# Patient Record
Sex: Female | Born: 1955 | Race: White | Hispanic: No | Marital: Married | Smoking: Former smoker
Health system: Southern US, Community
[De-identification: ages and names within clinical notes are randomized; demographics above are authoritative.]

## PROBLEM LIST (undated history)

## (undated) DIAGNOSIS — K589 Irritable bowel syndrome without diarrhea: Secondary | ICD-10-CM

## (undated) DIAGNOSIS — J3489 Other specified disorders of nose and nasal sinuses: Secondary | ICD-10-CM

## (undated) DIAGNOSIS — K219 Gastro-esophageal reflux disease without esophagitis: Secondary | ICD-10-CM

## (undated) DIAGNOSIS — I10 Essential (primary) hypertension: Secondary | ICD-10-CM

## (undated) DIAGNOSIS — Z8669 Personal history of other diseases of the nervous system and sense organs: Secondary | ICD-10-CM

## (undated) DIAGNOSIS — R0683 Snoring: Secondary | ICD-10-CM

## (undated) DIAGNOSIS — R112 Nausea with vomiting, unspecified: Secondary | ICD-10-CM

## (undated) DIAGNOSIS — J302 Other seasonal allergic rhinitis: Secondary | ICD-10-CM

## (undated) DIAGNOSIS — Z8719 Personal history of other diseases of the digestive system: Secondary | ICD-10-CM

## (undated) DIAGNOSIS — N393 Stress incontinence (female) (male): Secondary | ICD-10-CM

## (undated) DIAGNOSIS — M1990 Unspecified osteoarthritis, unspecified site: Secondary | ICD-10-CM

## (undated) DIAGNOSIS — R200 Anesthesia of skin: Secondary | ICD-10-CM

## (undated) DIAGNOSIS — Z9889 Other specified postprocedural states: Secondary | ICD-10-CM

## (undated) HISTORY — DX: Irritable bowel syndrome, unspecified: K58.9

## (undated) HISTORY — PX: LAPAROSCOPIC CHOLECYSTECTOMY: SUR755

## (undated) HISTORY — DX: Unspecified osteoarthritis, unspecified site: M19.90

## (undated) HISTORY — DX: Gastro-esophageal reflux disease without esophagitis: K21.9

## (undated) HISTORY — PX: TUBAL LIGATION: SHX77

## (undated) HISTORY — PX: ESOPHAGOGASTRODUODENOSCOPY: SHX1529

## (undated) HISTORY — PX: COLONOSCOPY: SHX174

## (undated) HISTORY — PX: DILATION AND CURETTAGE OF UTERUS: SHX78

## (undated) HISTORY — PX: LAPAROSCOPIC ASSISTED VAGINAL HYSTERECTOMY: SHX5398

## (undated) HISTORY — DX: Essential (primary) hypertension: I10

## (undated) HISTORY — PX: LAPAROSCOPIC GASTRIC BANDING: SHX1100

## (undated) HISTORY — PX: ORIF TOE FRACTURE: SUR965

---
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
## 1998-12-30 ENCOUNTER — Other Ambulatory Visit: Admission: RE | Admit: 1998-12-30 | Discharge: 1998-12-30 | Payer: Self-pay | Admitting: Obstetrics and Gynecology

## 2000-03-02 ENCOUNTER — Other Ambulatory Visit: Admission: RE | Admit: 2000-03-02 | Discharge: 2000-03-02 | Payer: Self-pay | Admitting: Obstetrics & Gynecology

## 2001-03-08 ENCOUNTER — Other Ambulatory Visit: Admission: RE | Admit: 2001-03-08 | Discharge: 2001-03-08 | Payer: Self-pay | Admitting: Obstetrics & Gynecology

## 2001-11-08 ENCOUNTER — Ambulatory Visit (HOSPITAL_COMMUNITY): Admission: AD | Admit: 2001-11-08 | Discharge: 2001-11-09 | Payer: Self-pay | Admitting: *Deleted

## 2001-11-09 ENCOUNTER — Encounter: Payer: Self-pay | Admitting: *Deleted

## 2001-12-01 ENCOUNTER — Ambulatory Visit (HOSPITAL_COMMUNITY): Admission: RE | Admit: 2001-12-01 | Discharge: 2001-12-01 | Payer: Self-pay | Admitting: Gastroenterology

## 2001-12-01 ENCOUNTER — Encounter: Payer: Self-pay | Admitting: Gastroenterology

## 2001-12-29 ENCOUNTER — Encounter (HOSPITAL_BASED_OUTPATIENT_CLINIC_OR_DEPARTMENT_OTHER): Payer: Self-pay | Admitting: General Surgery

## 2002-01-03 ENCOUNTER — Encounter (HOSPITAL_BASED_OUTPATIENT_CLINIC_OR_DEPARTMENT_OTHER): Payer: Self-pay | Admitting: General Surgery

## 2002-01-03 ENCOUNTER — Ambulatory Visit (HOSPITAL_COMMUNITY): Admission: RE | Admit: 2002-01-03 | Discharge: 2002-01-04 | Payer: Self-pay | Admitting: General Surgery

## 2002-01-03 ENCOUNTER — Encounter (INDEPENDENT_AMBULATORY_CARE_PROVIDER_SITE_OTHER): Payer: Self-pay | Admitting: *Deleted

## 2002-04-13 ENCOUNTER — Other Ambulatory Visit: Admission: RE | Admit: 2002-04-13 | Discharge: 2002-04-13 | Payer: Self-pay | Admitting: Internal Medicine

## 2002-05-17 ENCOUNTER — Ambulatory Visit (HOSPITAL_COMMUNITY): Admission: RE | Admit: 2002-05-17 | Discharge: 2002-05-17 | Payer: Self-pay | Admitting: Gastroenterology

## 2002-05-17 ENCOUNTER — Encounter (INDEPENDENT_AMBULATORY_CARE_PROVIDER_SITE_OTHER): Payer: Self-pay | Admitting: *Deleted

## 2003-01-26 ENCOUNTER — Ambulatory Visit (HOSPITAL_COMMUNITY): Admission: RE | Admit: 2003-01-26 | Discharge: 2003-01-26 | Payer: Self-pay | Admitting: Obstetrics & Gynecology

## 2003-01-26 ENCOUNTER — Encounter (INDEPENDENT_AMBULATORY_CARE_PROVIDER_SITE_OTHER): Payer: Self-pay

## 2004-02-06 ENCOUNTER — Observation Stay (HOSPITAL_COMMUNITY): Admission: RE | Admit: 2004-02-06 | Discharge: 2004-02-07 | Payer: Self-pay | Admitting: Obstetrics & Gynecology

## 2004-02-06 ENCOUNTER — Encounter (INDEPENDENT_AMBULATORY_CARE_PROVIDER_SITE_OTHER): Payer: Self-pay | Admitting: Specialist

## 2005-04-27 ENCOUNTER — Ambulatory Visit (HOSPITAL_COMMUNITY): Admission: RE | Admit: 2005-04-27 | Discharge: 2005-04-27 | Payer: Self-pay | Admitting: Internal Medicine

## 2008-01-09 ENCOUNTER — Ambulatory Visit (HOSPITAL_COMMUNITY): Admission: RE | Admit: 2008-01-09 | Discharge: 2008-01-09 | Payer: Self-pay | Admitting: Internal Medicine

## 2009-06-27 ENCOUNTER — Ambulatory Visit (HOSPITAL_COMMUNITY): Admission: RE | Admit: 2009-06-27 | Discharge: 2009-06-27 | Payer: Self-pay | Admitting: Surgery

## 2009-07-09 ENCOUNTER — Ambulatory Visit (HOSPITAL_COMMUNITY): Admission: RE | Admit: 2009-07-09 | Discharge: 2009-07-09 | Payer: Self-pay | Admitting: Surgery

## 2009-07-09 ENCOUNTER — Encounter: Admission: RE | Admit: 2009-07-09 | Discharge: 2009-08-28 | Payer: Self-pay | Admitting: Surgery

## 2009-10-16 ENCOUNTER — Encounter: Admission: RE | Admit: 2009-10-16 | Discharge: 2010-01-14 | Payer: Self-pay | Admitting: Surgery

## 2009-11-05 ENCOUNTER — Ambulatory Visit (HOSPITAL_COMMUNITY): Admission: RE | Admit: 2009-11-05 | Discharge: 2009-11-06 | Payer: Self-pay | Admitting: Surgery

## 2009-12-13 ENCOUNTER — Emergency Department (HOSPITAL_COMMUNITY): Admission: EM | Admit: 2009-12-13 | Discharge: 2009-12-14 | Payer: Self-pay | Admitting: Emergency Medicine

## 2010-02-21 ENCOUNTER — Encounter: Admission: RE | Admit: 2010-02-21 | Discharge: 2010-02-21 | Payer: Self-pay | Admitting: Surgery

## 2010-05-26 ENCOUNTER — Encounter: Admission: RE | Admit: 2010-05-26 | Discharge: 2010-05-29 | Payer: Self-pay | Admitting: Surgery

## 2010-08-20 ENCOUNTER — Encounter
Admission: RE | Admit: 2010-08-20 | Discharge: 2010-09-30 | Payer: Self-pay | Source: Home / Self Care | Attending: Surgery | Admitting: Surgery

## 2010-11-19 ENCOUNTER — Encounter: Payer: 59 | Attending: Surgery | Admitting: *Deleted

## 2010-11-19 ENCOUNTER — Encounter: Admit: 2010-11-19 | Payer: Self-pay | Admitting: Surgery

## 2010-11-19 DIAGNOSIS — Z09 Encounter for follow-up examination after completed treatment for conditions other than malignant neoplasm: Secondary | ICD-10-CM | POA: Insufficient documentation

## 2010-11-19 DIAGNOSIS — Z713 Dietary counseling and surveillance: Secondary | ICD-10-CM | POA: Insufficient documentation

## 2010-11-19 DIAGNOSIS — Z9884 Bariatric surgery status: Secondary | ICD-10-CM | POA: Insufficient documentation

## 2010-11-24 LAB — COMPREHENSIVE METABOLIC PANEL
ALT: 33 U/L (ref 0–35)
Alkaline Phosphatase: 75 U/L (ref 39–117)
CO2: 28 mEq/L (ref 19–32)
Chloride: 97 mEq/L (ref 96–112)
Glucose, Bld: 109 mg/dL — ABNORMAL HIGH (ref 70–99)
Sodium: 136 mEq/L (ref 135–145)
Total Bilirubin: 0.8 mg/dL (ref 0.3–1.2)
Total Protein: 7.2 g/dL (ref 6.0–8.3)

## 2010-11-24 LAB — DIFFERENTIAL
Basophils Absolute: 0 10*3/uL (ref 0.0–0.1)
Eosinophils Absolute: 0 10*3/uL (ref 0.0–0.7)
Eosinophils Relative: 0 % (ref 0–5)
Eosinophils Relative: 1 % (ref 0–5)
Lymphocytes Relative: 28 % (ref 12–46)
Lymphs Abs: 1.7 10*3/uL (ref 0.7–4.0)
Monocytes Absolute: 0.4 10*3/uL (ref 0.1–1.0)
Monocytes Absolute: 0.4 10*3/uL (ref 0.1–1.0)
Monocytes Relative: 6 % (ref 3–12)
Monocytes Relative: 7 % (ref 3–12)
Neutro Abs: 3.8 10*3/uL (ref 1.7–7.7)
Neutrophils Relative %: 79 % — ABNORMAL HIGH (ref 43–77)

## 2010-11-24 LAB — CBC
HCT: 42.5 % (ref 36.0–46.0)
MCHC: 33.6 g/dL (ref 30.0–36.0)
MCV: 100.4 fL — ABNORMAL HIGH (ref 78.0–100.0)
Platelets: 203 10*3/uL (ref 150–400)
RBC: 3.81 MIL/uL — ABNORMAL LOW (ref 3.87–5.11)
RDW: 13.1 % (ref 11.5–15.5)
WBC: 7.8 10*3/uL (ref 4.0–10.5)

## 2011-01-12 ENCOUNTER — Ambulatory Visit (HOSPITAL_COMMUNITY)
Admission: RE | Admit: 2011-01-12 | Discharge: 2011-01-12 | Disposition: A | Discharge status: Home / Self Care | Payer: 59 | Source: Ambulatory Visit | Attending: Internal Medicine | Admitting: Internal Medicine

## 2011-01-12 ENCOUNTER — Other Ambulatory Visit (HOSPITAL_COMMUNITY): Payer: Self-pay | Admitting: Internal Medicine

## 2011-01-12 DIAGNOSIS — M545 Low back pain, unspecified: Secondary | ICD-10-CM | POA: Insufficient documentation

## 2011-01-12 DIAGNOSIS — M5380 Other specified dorsopathies, site unspecified: Secondary | ICD-10-CM | POA: Insufficient documentation

## 2011-01-12 DIAGNOSIS — M5137 Other intervertebral disc degeneration, lumbosacral region: Secondary | ICD-10-CM | POA: Insufficient documentation

## 2011-01-12 DIAGNOSIS — M79609 Pain in unspecified limb: Secondary | ICD-10-CM | POA: Insufficient documentation

## 2011-01-12 DIAGNOSIS — M51379 Other intervertebral disc degeneration, lumbosacral region without mention of lumbar back pain or lower extremity pain: Secondary | ICD-10-CM | POA: Insufficient documentation

## 2011-01-16 NOTE — Op Note (Signed)
   Maureen [name], Maureen [name]                          ACCOUNT NO.:  [id number]   MEDICAL RECORD NO.:  [id number]                   PATIENT TYPE:  AMB   LOCATION:  SDC                                  FACILITY:  [facility]   PHYSICIAN:  Genia Del, M.D.             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   PROCEDURES:  1. Hysteroscopy.  2. Resection of polyp.  3. Dilatation and curettage.   SURGEON:  Genia Del, M.D.   ANESTHESIA:  Dorinda Hill T. Pamalee Leyden, M.D.   DESCRIPTION OF PROCEDURE:  Under general anesthesia with laryngeal mask, the  patient is in lithotomy position.  She is prepped with Betadine on the  suprapubic, vulvar, and vaginal areas.  The bladder is catheterized, and the  patient is draped as usual.  The vaginal exam reveals a retroverted uterus,  slightly irregular in shape, 9 cm, mobile, no adnexal mass.  The speculum is  inserted.  The anterior lip of the cervix is grasped with a tenaculum.  Dilatation of the cervix is done with Hegar dilators up to #31 without  difficulty.  We then insert the surgical hysteroscope in the intrauterine  cavity.  Visualization of the intrauterine cavity is achieved easily.  The  ostia are visualized and pictures are taken.  On the posterior wall a lesion  corresponding to a probable polyp is seen.  This is resected with the loop.  We then resect a smaller probable polyp and all specimens are sent to  pathology.  We then proceed with a systematic curettage of the intrauterine  cavity with a sharp curette, and that is sent separately.  We complete with  visualization again of the intrauterine cavity.  Hemostasis is adequate.  No  lesion is  seen.  We remove the instruments, and the estimated blood loss was  50 mL.  The fluid deficit was 140 mL.  No complication occurred, and the  patient was brought to the recovery room in good status.                                                 Genia Del, M.D.    ML/MEDQ  D:  01/26/2003  T:  01/26/2003  Job:  [id number]

## 2011-01-16 NOTE — H&P (Signed)
NAME:  Maureen [name], Maureen [name]                          ACCOUNT NO.:  [id number]   MEDICAL RECORD NO.:  [id number]                   PATIENT TYPE:  OBV   LOCATION:  NA                                   FACILITY:  [facility]   PHYSICIAN:  Genia Del, M.D.             DATE OF BIRTH:  04-02-1956   DATE OF ADMISSION:  02/06/2004  DATE OF DISCHARGE:                                HISTORY & PHYSICAL   HISTORY AND PHYSICAL:  Mrs. Brooker is a 55 year old G3, P1, A2.   REASON FOR ADMISSION:  Refractory menometrorrhagia and abdominal pelvic pain  associated with uterine myoma.   HISTORY OF PRESENT ILLNESS:  The patient had a hysteroscopy with resection  of a submucous myoma and D&C Jan 26, 2003.  She then was treated with  Micronor which helped at first but now menometrorrhagia recurring with  increased lower abdominal and lower back pain.   PAST MEDICAL HISTORY:  1. Cystitis.  2. Borderline chronic hypertension, no current treatment.   PAST SURGICAL HISTORY:  1. Excisional biopsy, left breast in 1971.  2. Diagnostic laparoscopy, conservative treatment of endometriosis in mid     1990s.  3. Bilateral tubal sterilization.  4. One spontaneous vaginal delivery.  5. Two spontaneous abortions.  6. Hysteroscopy with resection of endometrial polyps Jan 26, 2003.   ALLERGIES:  No known drug allergies.   CURRENT MEDICATIONS:  Micronor.   SOCIAL HISTORY:  Married, nonsmoker.   FAMILY HISTORY:  Cardiovascular disease, diabetes mellitus, and chronic  hypertension.   REVIEW OF SYSTEMS:  HEENT:  Negative.  CARDIOVASCULAR AND RESPIRATORY:  Negative.  GI AND GU: Negative.  DERMATOLOGIC, ENDOCRINE, NEUROLOGIC:  Negative.   PHYSICAL EXAMINATION:  GENERAL:  No apparent distress.  VITAL SIGNS:  Weight 217 pounds.  Blood pressure 130/88, pulse 80, afebrile.  Regular respiratory rate.  LUNGS:  Clear bilaterally.  HEART:  Regular cardiac rhythm.  ABDOMEN:  Soft, nontender.  No mass felt.  No  hepatosplenomegaly.  VAGINAL EXAM:  Uterus is retroverted about 10 cm, nontender, mobile, no  adnexal mass.  EXTREMITIES:  Lower limbs normal, good pulses bilaterally, no edema.   LABORATORY AND X-RAY DATA:  Ultrasound April 2004:  Uterus was 10.6 cm  maximum diameter with three fibroids.  Maximum size was 3.9 cm posteriorly.   IMPRESSION:  Refractory menometrorrhagia associated with uterine myomas post  conservative surgery and medical treatment.   PLAN:  Laparoscopic-assisted vaginal hysterectomy with preservation of  ovaries after thorough discussion of benefits of risks of preserving the  ovaries.  Surgery and risks reviewed including trauma to the bowel, blood  vessels, bladder, and ureters; risks of hemorrhage and blood transfusion;  risks of DVT up to pulmonary embolism; risks of infection; risks of  anesthesia complication.  The patient voiced understanding and agreement  with plan.  Genia Del, M.D.    ML/MEDQ  D:  02/05/2004  T:  02/05/2004  Job:  [id number]

## 2011-01-16 NOTE — Op Note (Signed)
NAMEAMANDA, Maureen [name]                          ACCOUNT NO.:  [id number]   MEDICAL RECORD NO.:  [id number]                   PATIENT TYPE:  AMB   LOCATION:  ENDO                                 FACILITY:  [facility]   PHYSICIAN:  Charna Elizabeth, M.D.                   DATE OF BIRTH:  03/28/1956   DATE OF PROCEDURE:  05/17/2002  DATE OF DISCHARGE:  05/17/2002                                 OPERATIVE REPORT   PROCEDURE:  Screening colonoscopy.   ENDOSCOPIST:  Charna Elizabeth, M.D.   INSTRUMENT USED:  Olympus video colonoscope.   INDICATION FOR PROCEDURE:  A 55 year old white female with a family history  of colon cancer and blood in stool with chronic diarrhea.  Rule out colonic  polyps, masses, hemorrhoids, etc.   PREPROCEDURE PREPARATION:  Informed consent was procured from the patient.  The patient was fasted for eight hours prior to the procedure and prepped  with a bottle of magnesium citrate and a gallon of NuLytely the night prior  to the procedure.   PREPROCEDURE PHYSICAL:  VITAL SIGNS:  The patient had stable vital signs.  NECK:  Supple.  CHEST:  Clear to auscultation.  S1, S2 regular.  ABDOMEN:  Soft with normal bowel sounds.   DESCRIPTION OF PROCEDURE:  The patient was placed in the left lateral  decubitus position and sedated with 20 mg of Demerol and an additional 2.5  mg of Versed intravenously for the colonoscopy in addition to the sedation  she received for the EGD.  Once the patient was adequately sedate and  maintained on low-flow oxygen and continuous cardiac monitoring, the Olympus  video colonoscope was advanced from the rectum to the cecum and terminal  ileum without difficulty.  No masses, polyps, erosions, ulcerations, or  diverticula were seen.  There was no evidence of hemorrhoids.  The procedure  was complete up to the cecum and terminal ileum.  The appendiceal orifice  and the ileocecal valve were clearly visualized and photographed.  The  terminal ileum  appeared normal.   IMPRESSION:  Normal colonoscopy up to the terminal ileum.  No masses,  polyps, erosions, ulcerations, diverticula, or hemorrhoids seen.   RECOMMENDATIONS:  1. Cholestyramine 9 g pack, half b.i.d. has been prescribed for the     patient's diarrhea, which is probably secondary to her cholecystectomy.  2. A high-fiber diet has been discussed with her in great detail, and     brochures have been given to her for her education.  3.     Repeat colorectal cancer screening is recommended in the next five years     unless the patient develops any abnormal symptoms in the interim.  4. Outpatient follow-up in the next two weeks or earlier if need be.  Charna [place], M.D.    JM/MEDQ  D:  05/17/2002  T:  05/19/2002  Job:  [id number]   cc:   Lucky Cowboy, MD  [address]  [place], [place] [address]  Fax: [phone]

## 2011-01-16 NOTE — Discharge Summary (Signed)
[place]. [facility]  Patient:    Maureen [name], Maureen [name] Visit Number: [id number] MRN: [id number]          Service Type: MED Location: [phone] Attending Physician:  Glennon [place] Dictated by:   Lavella Hammock, P.A. Admit Date:  11/08/2001 Discharge Date: 11/09/2001   CC:         Anselmo Rod, M.D.  Lindaann Pascal, P.A.C., for Dr. Lucky Cowboy   Referring Physician Discharge [place]  DATE OF BIRTH:  03-Sep-1955  PROCEDURES:  Stress Cardiolite.  HOSPITAL COURSE:  [name] is a 55 year old female with no known history of coronary artery disease who was evaluated in the office on November 08, 2001 for chest tightness.  She had been having episodic chest tightness for a period of time and was being evaluated by her family physician for this.  There was concern because of a family history of premature coronary artery disease and because of that, because of recurrent episodes of chest pain she was repeated to the hospital to rule out MI and for further evaluation.  Her enzymes were negative for MI and she was scheduled for a stress Cardiolite which was performed on November 09, 2001.  The patient exercised and then had Cardiolite images done.  The rest and stress images showed no ischemia, no scar, and an EF of 73%.  The patient had no chest pain and no EKG changes during the stress period.  There was some elevation in her blood sugars with a fasting blood sugar of 155 but hemoglobin A1c was done and was within normal limits at 5.1%. Additionally, she had a lipid profile checked and that was also within normal limits.  With no further episodes of chest pain with a negative Cardiolite she was considered stable for discharge on November 09, 2001.  LABORATORY DATA:  Hemoglobin A1c 5.1.  Total cholesterol 150, triglycerides 127, HDL 47, LDL 78.  TSH within normal limits at 2.038.  Sodium 137, potassium 3.8, chloride 102, CO2 26, BUN 8, creatinine 0.7, and glucose  155. Hemoglobin 13.2, hematocrit 38, wbcs 6.1, platelets 243.  DISCHARGE CONDITION:  Stable.  DISCHARGE DIAGNOSES: 1. Chest pain, possibly secondary to musculoskeletal disease or reflux    symptoms.  Patient is to follow up as scheduled with Dr. Elsie Amis of GI    and with her primary care physician. 2. Hypertension. 3. History of bronchitis, possible asthma. 4. Irritable bowel syndrome. 5. Remote history of mild tobacco use. 6. Family history of premature coronary artery disease with a father who had    his first myocardial infarction in his 23s.  DISCHARGE INSTRUCTIONS:  ACTIVITY:  Her activity level is to be as tolerated.  DIET:  She is to stick to a low fat, no sugar added diet until she gets further evaluated by Dr. Oneta Rack.  FOLLOW-UP:  She is to follow up with Dr. Elsie Amis as scheduled.  She is to follow up with Lindaann Pascal, P.A.C. for Dr. Oneta Rack and call for an appointment. She is to follow up with Dr. Corinda Gubler as needed.  DISCHARGE MEDICATIONS: 1. Singular q.d. 2. Tiazac 180 mg q.d. 3. Albuterol MDI p.r.n. 4. Multivitamins, vitamin E, vitamins C as taken prior to admission. 5. Nexium 40 mg q.d. Dictated by:   Lavella Hammock, P.A. Attending Physician:  Glennon [place] DD:  11/09/01 TD:  11/09/01 Job: [id number]

## 2011-01-16 NOTE — Op Note (Signed)
[place]. [facility]  Patient:    Maureen [name], Maureen [name] Visit Number: [id number] MRN: [id number]          Service Type: DSU Location: [phone] Attending Physician:  Sonda Primes Dictated by:   Mardene Celeste. Lurene Shadow, M.D. Proc. Date: 01/03/02 Admit Date:  01/03/2002 Discharge Date: 01/04/2002                             Operative Report  PREOPERATIVE DIAGNOSIS:  Chronic calculus cholecystitis.  POSTOPERATIVE DIAGNOSIS:  Chronic calculus cholecystitis.  OPERATION PERFORMED:  Laparoscopic cholecystectomy with intraoperative cholangiogram.  SURGEON:  Luisa Hart L. Lurene Shadow, M.D.  ASSISTANT:  Marnee Spring. Wiliam Ke, M.D.  ANESTHESIA:  General.  INDICATIONS FOR PROCEDURE:  This patient is a 55 year old woman who presents with upper abdominal symptoms and a gallbladder ultrasound which demonstrates cholelithiasis.  The risks and potential benefits of laparoscopic cholecystectomy have been discussed in detail with the patient.  She understands and accepts these risks and gives consent for surgery.  DESCRIPTION OF PROCEDURE:  Following the induction of satisfactory general anesthesia with the patient positioned supine, the abdomen was routinely prepped and draped to be included in a sterile operative field.  Open laparoscopy was created at the umbilicus with insertion of a Hasson cannula and insufflation of the peritoneal cavity to 14 mHg pressure using carbon dioxide.  A camera was inserted and visual exploration of the abdomen carried out.  Liver edges were sharp.  Liver surface was smooth.  Anterior gastric wall, duodenal sweep appeared to be normal.  None of the small or large intestine viewed appeared to be abnormal.  With the exception of the bladder, no pelvic organs were visualized.  Under direct vision, epigastric and lateral ports were placed.  The gallbladder was grasped and retracted cephalad and dissection carried down near the ampulla with  isolation of the cystic artery and the cystic duct.  The cystic artery was somewhat adhered to the cystic duct and was dissected away from it.  The cystic artery was doubly clipped and then transected.  The cystic duct was clipped proximally and opened.  Cystic duct cholangiogram was carried out by inserting a Reddick catheter into the abdomen through a 14 gauge angiocath and injecting one half strength Hypaque into the biliary system.  The resulting cholangiogram showed contrast flowing freely into the duodenum and distally into the hepatic radicals.  There were no filling defects noted and the ductal system was within normal caliber.  The cystic duct catheter was removed and the cystic duct then doubly clipped and transected.  The gallbladder dissected free from the liver edge maintaining hemostasis throughout the course of dissection with electrocautery.  At the end of dissection, the liver bed was again checked for hemostasis and noted to be dry.  The right upper quadrant was then thoroughly irrigated with normal saline.  The camera moved to the epigastric port and the gallbladder retrieved through the umbilical port using an Endocatch.  The right upper quadrant was then thoroughly aspirated.  Sponge, instrument and sharp counts were verified. Pneumoperitoneum allowed to deflate and the wounds closed in layers as follows.  Umbilical wound in two layers with 0 Dexon and 4-0 Dexon. Epigastric and lateral port wounds were closed with 4-0 Dexon sutures.  All wounds were reinforced with Steri-Strips.  Sterile dressings applied. Anesthetic reversed.  Patient removed from the operating room to the recovery room in stable condition having tolerated the procedure  well. Dictated by:   Mardene Celeste. Lurene Shadow, M.D. Attending Physician:  Sonda Primes DD:  01/03/02 TD:  01/04/02 Job: [id number]

## 2011-01-16 NOTE — Op Note (Signed)
Maureen [name], Maureen [name]                          ACCOUNT NO.:  [id number]   MEDICAL RECORD NO.:  [id number]                   PATIENT TYPE:  OBV   LOCATION:  9399                                 FACILITY:  [facility]   PHYSICIAN:  Genia Del, M.D.             DATE OF BIRTH:  Nov 15, 1955   DATE OF PROCEDURE:  02/06/2004  DATE OF DISCHARGE:                                 OPERATIVE REPORT   PREOPERATIVE DIAGNOSIS:  Refractory menometrorrhagia with abdominal pelvic  pains associated with uterine myomas.   POSTOPERATIVE DIAGNOSIS:  Refractory menometrorrhagia with abdominal pelvic  pains associated with uterine myomas.   INTERVENTION:  Laparoscopically assisted vaginal hysterectomy.   SURGEON:  Genia Del, M.D.   ASSISTANT:  Richardean Sale, M.D.   ANESTHESIOLOGIST:  Raul Del, M.D.   PROCEDURE:  Under general anesthesia with endotracheal intubation, the  patient was in the lithotomy position for operative laparoscopy, she is  prepped with Hibiclens on the abdomen, suprapubic, vulvar and vaginal areas.  The bladder catheter was inserted.  The patient is draped as usual.  A  speculum is introduced in the vagina.  The uterus is cannulated and the  speculum is removed.  Abdominally, the infraumbilical area is infiltrated at  the subcutaneous tissue with Marcaine 0.25% plain, 7 mL.  We made a 2 cm  infraumbilical incision with the scalpel.  We then grasped the aponeurosis  with Allis' and opened the aponeurosis under direct vision with the Mayo  scissors.  The parietal peritoneum is opened, also, with Mayo scissors under  direct vision.  We then made a purse-string suture of 0 Vicryl around the  aponeurosis.  We introduced the Hasson trocar and the laparoscope at that  level.  Inspection of the pelvic cavity reveals a retroverted uterus of  increased volume about 10 cm with uterine myomas.  Both ovaries are normal  in size and appearance.  Both tubes show previous tubal  sterilization.  The  last ovary had two small cysts distally.  The pelvis inspection is,  otherwise, normal.  The abdominal inspection is normal.  We made a lateral  incision at the left iliac area.  The subcutaneous tissues were 0.25%  Marcaine 3 mL.  We then made a 5 cm incision with the scalpel and introduced  the 5 mm trocar under direct vision.  We proceed the same way on the right  side.  Instruments were then placed, the atraumatic clamp and the tripolar.  We cauterized and sectioned extensively the left round ligament.  Then, we  removed the distal parts of the left tube with the small cyst.  We  cauterized and sectioned the left utero-ovarian ligament and moved down  following the uterus and stopped just before the left uterine artery.  The  visceroperitoneum anteriorly is opened with the scissors and the bladder is  reclined downward.  We proceeded the same way on the right side except  that  on that side, the distal end of the tube appears normal and is left in  place.  Hemostasis is adequate.  We removed the distal left tube through the  infraumbilical trocar using a 5 mm camera on the right iliac trocar.  The  specimen is sent to pathology with the uterus at the end of the  intervention.  We removed all instruments.  We draped the abdomen.  We  repositioned the patient's legs upwards and proceeded with vaginal  procedure.   We introduced a short, weighted speculum in the vagina.  The intrauterine  cannula is removed and tenaculums are put on the cervix.  Infiltration of  the cervix at the junction of the vaginal mucosa is done circumferentially  with Xylocaine 1% with 1:200,000 epinephrine, 10 mL.  We then used the  scalpel to make an incision circumferentially at that level.  We then used a  sponge on a finger to push up the vaginal mucosa on the cervix.  The  peritoneum is opened anteriorly and posteriorly.  The long weighted speculum  is put in place posteriorly and a  retractor is used anteriorly to push the  bladder away from the surgical field.  We clamped the cardinal ligaments and  the uterosacral ligaments on the left side with a curved Heaney.  We  sectioned with Mayo scissors and sutured with a Heaney stitch of 0 Vicryl.  We kept it on a hemostat for later suspension.  We proceed the same way on  the right side.  We then used LigaSure to cauterize and then the Metzenbaum  scissors to section the uterine arteries on each side following the uterus  closely reaching the area where the surgery was stopped laparoscopically.  The uterus is removed in one piece including the cervix and sent to  pathology with the distal left tube.  We then made a running suture of 0  Vicryl in the posterior vaginal vault.  We then made a suspensory suture on  the left side including the anterior vaginal mucosa, the anterior  peritoneum, the left uterosacral ligament, the posterior peritoneum, and the  posterior vaginal vault, this is attached on the left side.  We proceed the  same way on the right side.  We then closed the vaginal vault with X  stitches of 0 Vicryl anteriorly to posteriorly.  In the middle, the  uterosacral ligaments are attached together.  Hemostasis was adequate at all  pedicles.  Hemostasis is adequate after closing the vagina, as well.  We  removed vaginal instruments.   We go back to the abdomen.  The pneumoperitoneum is recreated.  We inspect  the abdominal pelvic cavity, irrigation and suction is done, very small  points of bleeding are seen at the vaginal vault.  Those are completed with  the tripolar very superficially.  Hemostasis is adequate at all pedicles.  We evacuate the CO2, remove the instruments and trocars under direct vision.  We attach the suture on the aponeurosis at the infraumbilical incision.  The  sponge and instrument counts were complete.  We close the infraumbilical incision at the skin with subcuticular 4-0 Monocryl.   Hemostasis is  adequate.  We closed the 5 mm incision with Dermabond at the skin.  The  estimated blood loss was 1 mL.  No complications occurred.  The patient was  transferred stable to the recovery room.  Note that she received Ancef 1  gram IV at induction.  Genia Del, M.D.    ML/MEDQ  D:  02/06/2004  T:  02/06/2004  Job:  [id number]

## 2011-01-16 NOTE — Op Note (Signed)
NAMECARYLE, Maureen [name]                          ACCOUNT NO.:  [id number]   MEDICAL RECORD NO.:  [id number]                   PATIENT TYPE:  AMB   LOCATION:  ENDO                                 FACILITY:  [facility]   PHYSICIAN:  Charna Elizabeth, M.D.                   DATE OF BIRTH:  08/19/1956   DATE OF PROCEDURE:  05/17/2002  DATE OF DISCHARGE:  05/17/2002                                 OPERATIVE REPORT   PROCEDURE:  Esophagogastroduodenoscopy with biopsies.   ENDOSCOPIST:  Charna Elizabeth, M.D.   INSTRUMENT USED:  Olympus video panendoscope.   INDICATION FOR PROCEDURE:  History of epigastric pain, diarrhea, and blood  in stool in a 55 year old white female.  Rule out peptic ulcer disease,  esophagitis, gastritis, etc.  The patient is status post cholecystectomy.   PREPROCEDURE PREPARATION:  Informed consent was procured from the patient.  The patient was fasted for eight hours prior to the procedure.   PREPROCEDURE PHYSICAL:  VITAL SIGNS:  The patient had stable vital signs.  NECK:  Supple.  CHEST:  Clear to auscultation.  S1, S2 regular.  ABDOMEN:  Soft with normal bowel sounds.  Well-healed scars from previous  cholecystectomy.   DESCRIPTION OF PROCEDURE:  The patient was placed in the left lateral  decubitus position and sedated with 80 mg of Demerol and 7.5 mg of Versed  intravenously.  Once the patient was adequately sedate and maintained on low-  flow oxygen and continuous cardiac monitoring, the Olympus video  panendoscope was advanced through the mouthpiece, over the tongue, into the  esophagus under direct vision.  The entire esophagus appeared normal with no  evidence of ring, stricture, masses, esophagitis, or Barrett's mucosa.  The  scope was then advanced into the stomach.  A 3-4 cm hiatal hernia was  appreciated on retroflexion in the high cardia.  A small polyp was biopsied  from the proximal stomach.  No ulcers, erosions, masses, or polyps were  seen.  The proximal  small bowel appeared normal as well.   IMPRESSION:  1. Normal-appearing esophagus and proximal small bowel.  2. A 3-4 cm hiatal hernia.  3. Small polyp biopsied from proximal stomach.   RECOMMENDATIONS:  1. Proceed with colonoscopy at this time.  2.     Await pathology results.  3. Follow antireflux measures and avoid all nonsteroidals.  4. Further recommendations will be made on an outpatient basis.                                               Charna Elizabeth, M.D.    JM/MEDQ  D:  05/17/2002  T:  05/19/2002  Job:  [id number]   cc:   Lucky Cowboy, MD  [address]  [place], [place] [address]  Fax: [phone]

## 2011-02-26 ENCOUNTER — Encounter (INDEPENDENT_AMBULATORY_CARE_PROVIDER_SITE_OTHER): Payer: Self-pay | Admitting: Surgery

## 2011-05-08 ENCOUNTER — Encounter (INDEPENDENT_AMBULATORY_CARE_PROVIDER_SITE_OTHER): Payer: Self-pay | Admitting: Surgery

## 2011-05-20 ENCOUNTER — Ambulatory Visit: Payer: 59 | Admitting: *Deleted

## 2011-07-31 ENCOUNTER — Encounter (INDEPENDENT_AMBULATORY_CARE_PROVIDER_SITE_OTHER): Payer: Self-pay

## 2011-07-31 ENCOUNTER — Ambulatory Visit (INDEPENDENT_AMBULATORY_CARE_PROVIDER_SITE_OTHER): Payer: 59 | Admitting: Physician Assistant

## 2011-07-31 VITALS — BP 136/88 | Ht 61.0 in | Wt 188.8 lb

## 2011-07-31 DIAGNOSIS — Z4651 Encounter for fitting and adjustment of gastric lap band: Secondary | ICD-10-CM

## 2011-07-31 NOTE — Patient Instructions (Signed)
Take clear liquids for the next 48 hours. Thin protein shakes are ok to start on Saturday evening. Call us if you have persistent vomiting or regurgitation, night cough or reflux symptoms. Return as scheduled or sooner if you notice no changes in hunger/portion sizes.   

## 2011-07-31 NOTE — Progress Notes (Signed)
  HISTORY: Maureen [name] is a 55 y.o.female who received an AP-Standard lap-band in March 2011 by Dr. Daphine Deutscher. She comes in stating her hunger and portion sizes have increased slightly and that she'd like a small adjustment. She denies persistent vomiting or reflux.  VITAL SIGNS: Filed Vitals:   07/31/11 1330  BP: 136/88    PHYSICAL EXAM: Physical exam reveals a very well-appearing 55 y.o.female in no apparent distress Neurologic: Awake, alert, oriented Psych: Bright affect, conversant Respiratory: Breathing even and unlabored. No stridor or wheezing Abdomen: Soft, nontender, nondistended to palpation. Incisions well-healed. No incisional hernias. Port easily palpated. Extremities: Atraumatic, good range of motion.  ASSESMENT: 55 y.o.  female  s/p AP-Standard lap-band.   PLAN: [name] port was accessed with a 20G Huber needle without difficulty. Clear fluid was aspirated and 0.25 mL saline was added to the port. The patient was able to swallow water without difficulty following the procedure and was instructed to take clear liquids for the next 24-48 hours and advance slowly as tolerated.

## 2011-09-25 ENCOUNTER — Encounter (INDEPENDENT_AMBULATORY_CARE_PROVIDER_SITE_OTHER): Payer: Self-pay | Admitting: Physician Assistant

## 2011-09-25 ENCOUNTER — Ambulatory Visit (INDEPENDENT_AMBULATORY_CARE_PROVIDER_SITE_OTHER): Payer: 59 | Admitting: Physician Assistant

## 2011-09-25 VITALS — BP 126/84 | Ht 61.0 in | Wt 187.2 lb

## 2011-09-25 DIAGNOSIS — K219 Gastro-esophageal reflux disease without esophagitis: Secondary | ICD-10-CM

## 2011-09-25 DIAGNOSIS — Z4651 Encounter for fitting and adjustment of gastric lap band: Secondary | ICD-10-CM

## 2011-09-25 NOTE — Progress Notes (Signed)
  HISTORY: Maureen [name] is a 56 y.o.female who received an AP-Standard lap-band in March 2011 by Dr. Daphine Deutscher. She was doing well from her last adjustment until about 2-3 weeks afterward when she developed a GI illness. After this resolved, she began having nocturnal reflux, almost nightly. She denies any solid food dysphagia. She has not taken any medication for the symptoms.  VITAL SIGNS: Filed Vitals:   09/25/11 0858  BP: 126/84    PHYSICAL EXAM: Physical exam reveals a very well-appearing 56 y.o.female in no apparent distress Neurologic: Awake, alert, oriented Psych: Bright affect, conversant Respiratory: Breathing even and unlabored. No stridor or wheezing Abdomen: Soft, nontender, nondistended to palpation. Incisions well-healed. No incisional hernias. Port easily palpated. Extremities: Atraumatic, good range of motion.  ASSESMENT: 56 y.o.  female  s/p AP-Standard lap-band.   PLAN: [name] port was accessed with a 20G Huber needle without difficulty. Clear fluid was aspirated and 0.25 mL saline was removed from the port. I recommended OTC omeprazole until our next visit in 6 weeks or so.

## 2011-09-25 NOTE — Patient Instructions (Signed)
Take omeprazole (20mg  tablets) twice a day for one week followed by once daily. Return in 4-6 weeks. Return sooner if symptoms persist despite the above measures.

## 2011-11-05 ENCOUNTER — Ambulatory Visit (INDEPENDENT_AMBULATORY_CARE_PROVIDER_SITE_OTHER): Payer: 59 | Admitting: Physician Assistant

## 2011-11-05 ENCOUNTER — Encounter (INDEPENDENT_AMBULATORY_CARE_PROVIDER_SITE_OTHER): Payer: Self-pay

## 2011-11-05 VITALS — BP 142/80 | HR 68 | Temp 97.6°F | Resp 18 | Ht 61.0 in | Wt 190.2 lb

## 2011-11-05 DIAGNOSIS — Z4651 Encounter for fitting and adjustment of gastric lap band: Secondary | ICD-10-CM

## 2011-11-05 NOTE — Patient Instructions (Signed)
Take clear liquids tonight. Thin protein shakes are ok to start tomorrow morning. Slowly advance your diet thereafter. Call us if you have persistent vomiting or regurgitation, night cough or reflux symptoms. Return as scheduled or sooner if you notice no changes in hunger/portion sizes.  

## 2011-11-05 NOTE — Progress Notes (Signed)
  HISTORY: Maureen [name] is a 56 y.o.female who received an AP-Standard lap-band in March 2011 by Dr. Daphine Deutscher. She was last seen in late January for reflux following a GI illness. That has since resolved. She's been taking protonix as well. She would like to have some more fluid placed to help her continue to lose weight.  VITAL SIGNS: Filed Vitals:   11/05/11 1108  BP: 142/80  Pulse: 68  Temp: 97.6 F (36.4 C)  Resp: 18    PHYSICAL EXAM: Physical exam reveals a very well-appearing 56 y.o.female in no apparent distress Neurologic: Awake, alert, oriented Psych: Bright affect, conversant Respiratory: Breathing even and unlabored. No stridor or wheezing Abdomen: Soft, nontender, nondistended to palpation. Incisions well-healed. No incisional hernias. Port easily palpated. Extremities: Atraumatic, good range of motion.  ASSESMENT: 56 y.o.  female  s/p AP-Standard lap-band.   PLAN: [name] port was accessed with a 20G Huber needle without difficulty. Clear fluid was aspirated and 0.2 mL saline was added to the port. The patient was able to swallow water without difficulty following the procedure and was instructed to take clear liquids for the next 24-48 hours and advance slowly as tolerated.

## 2011-12-30 ENCOUNTER — Telehealth (INDEPENDENT_AMBULATORY_CARE_PROVIDER_SITE_OTHER): Payer: Self-pay | Admitting: General Surgery

## 2011-12-30 NOTE — Telephone Encounter (Signed)
Patient came into clinic this morning (about 8:40) stating that her band feels tight. Patient stated that the last time she saw Mardelle [facility]te in [facility] (11/05/11) he gave her Protonix which she said helped her. Patient stated that the same feeling she had then is coming back and that the other day her chest felt full of air, she belched and said any food she eats (which is a small amount) will feel as if it is sitting there. Patient confirmed her contact numbers were [phone] (work) and [phone] (cell) and a message can be left if needed. I advised I or someone else in the clinic will contact her with an appointment to have fluid removed. Patient agreed.

## 2011-12-31 ENCOUNTER — Telehealth (INDEPENDENT_AMBULATORY_CARE_PROVIDER_SITE_OTHER): Payer: Self-pay | Admitting: General Surgery

## 2011-12-31 ENCOUNTER — Encounter (INDEPENDENT_AMBULATORY_CARE_PROVIDER_SITE_OTHER): Payer: Self-pay

## 2011-12-31 ENCOUNTER — Ambulatory Visit (INDEPENDENT_AMBULATORY_CARE_PROVIDER_SITE_OTHER): Payer: 59 | Admitting: Physician Assistant

## 2011-12-31 ENCOUNTER — Ambulatory Visit (HOSPITAL_COMMUNITY)
Admission: RE | Admit: 2011-12-31 | Discharge: 2011-12-31 | Disposition: A | Discharge status: Home / Self Care | Payer: 59 | Source: Ambulatory Visit | Attending: Physician Assistant | Admitting: Physician Assistant

## 2011-12-31 ENCOUNTER — Telehealth (INDEPENDENT_AMBULATORY_CARE_PROVIDER_SITE_OTHER): Payer: Self-pay | Admitting: Physician Assistant

## 2011-12-31 VITALS — BP 120/86 | HR 88 | Temp 97.0°F | Resp 14 | Ht 61.0 in | Wt 187.2 lb

## 2011-12-31 DIAGNOSIS — Z4651 Encounter for fitting and adjustment of gastric lap band: Secondary | ICD-10-CM

## 2011-12-31 DIAGNOSIS — R109 Unspecified abdominal pain: Secondary | ICD-10-CM | POA: Insufficient documentation

## 2011-12-31 DIAGNOSIS — K219 Gastro-esophageal reflux disease without esophagitis: Secondary | ICD-10-CM | POA: Insufficient documentation

## 2011-12-31 DIAGNOSIS — Z9884 Bariatric surgery status: Secondary | ICD-10-CM | POA: Insufficient documentation

## 2011-12-31 NOTE — Patient Instructions (Signed)
Take protein shakes for the next 1-2 days then advance as tolerated. Return in 2 weeks or sooner if needed.

## 2011-12-31 NOTE — Progress Notes (Signed)
  HISTORY: Maureen [name] is a 56 y.o.female who received an AP-Standard lap-band in March 2011 by Dr. Daphine Deutscher. She comes in complaining of progressive reflux over the past 1.5 weeks. She has tried omeprazole without relief. Last night she had to sleep in a recliner and was unable to tolerate dinner previously.  VITAL SIGNS: Filed Vitals:   12/31/11 1017  BP: 120/86  Pulse: 88  Temp: 97 F (36.1 C)  Resp: 14    PHYSICAL EXAM: Physical exam reveals a very well-appearing 56 y.o.female in no apparent distress Neurologic: Awake, alert, oriented Psych: Bright affect, conversant Respiratory: Breathing even and unlabored. No stridor or wheezing Abdomen: Soft, nontender, nondistended to palpation. Incisions well-healed. No incisional hernias. Port easily palpated. Extremities: Atraumatic, good range of motion.  ASSESMENT: 56 y.o.  female  s/p AP-Standard lap-band.   PLAN: [name] port was accessed with a 20G Huber needle without difficulty. Clear fluid was aspirated and 1 mL saline was removed from the port. I recommended she take liquids over the next 1-2 days to let things calm down and to obtain a KUB.

## 2011-12-31 NOTE — Telephone Encounter (Signed)
Spoke with patient to reconfirm what she has been dealing with since the last lap band visit on 11/05/11. Patient stated that she vomited last night about 15 seconds after completing her meal.   Per my discussion with Lenard Forth she needs to come in right now. Patient advised me that it will take her about 45 minutes to get here on her way.

## 2012-01-14 ENCOUNTER — Encounter (INDEPENDENT_AMBULATORY_CARE_PROVIDER_SITE_OTHER): Payer: Self-pay | Admitting: Physician Assistant

## 2012-01-14 ENCOUNTER — Ambulatory Visit (INDEPENDENT_AMBULATORY_CARE_PROVIDER_SITE_OTHER): Payer: 59 | Admitting: Physician Assistant

## 2012-01-14 VITALS — BP 154/94 | HR 76 | Temp 97.9°F | Resp 16 | Ht 61.0 in | Wt 192.4 lb

## 2012-01-14 DIAGNOSIS — Z4651 Encounter for fitting and adjustment of gastric lap band: Secondary | ICD-10-CM

## 2012-01-14 NOTE — Progress Notes (Signed)
  HISTORY: Maureen [name] is a 56 y.o.female who received an AP-Standard lap-band in March 2011 by Dr. Daphine Deutscher. She comes in after having fluid removed two weeks ago secondary to persistent regurgitation/vomiting. She now has no such complaints. She would like an adjustment to get back on track.  VITAL SIGNS: Filed Vitals:   01/14/12 0950  BP: 154/94  Pulse: 76  Temp: 97.9 F (36.6 C)  Resp: 16    PHYSICAL EXAM: Physical exam reveals a very well-appearing 56 y.o.female in no apparent distress Neurologic: Awake, alert, oriented Psych: Bright affect, conversant Respiratory: Breathing even and unlabored. No stridor or wheezing Abdomen: Soft, nontender, nondistended to palpation. Incisions well-healed. No incisional hernias. Port easily palpated. Extremities: Atraumatic, good range of motion.  ASSESMENT: 56 y.o.  female  s/p AP-Standard lap-band.   PLAN: [name] port was accessed with a 20G Huber needle without difficulty. Clear fluid was aspirated and 0.75 mL saline was added to the port. The patient was able to swallow water without difficulty following the procedure and was instructed to take clear liquids for the next 24-48 hours and advance slowly as tolerated.

## 2012-01-14 NOTE — Patient Instructions (Signed)
Take clear liquids tonight. Thin protein shakes are ok to start tomorrow morning. Slowly advance your diet thereafter. Call us if you have persistent vomiting or regurgitation, night cough or reflux symptoms. Return as scheduled or sooner if you notice no changes in hunger/portion sizes.  

## 2012-01-28 ENCOUNTER — Encounter (INDEPENDENT_AMBULATORY_CARE_PROVIDER_SITE_OTHER): Payer: 59

## 2012-10-20 ENCOUNTER — Other Ambulatory Visit: Payer: Self-pay

## 2012-12-08 ENCOUNTER — Encounter (INDEPENDENT_AMBULATORY_CARE_PROVIDER_SITE_OTHER): Payer: Self-pay

## 2012-12-08 ENCOUNTER — Ambulatory Visit (INDEPENDENT_AMBULATORY_CARE_PROVIDER_SITE_OTHER): Payer: 59 | Admitting: Physician Assistant

## 2012-12-08 VITALS — BP 132/96 | HR 83 | Resp 16 | Ht 61.0 in | Wt 214.2 lb

## 2012-12-08 DIAGNOSIS — Z4651 Encounter for fitting and adjustment of gastric lap band: Secondary | ICD-10-CM

## 2012-12-08 NOTE — Progress Notes (Signed)
  HISTORY: Maureen [name] is a 57 y.o.female who received an AP-Standard lap-band in March 2011 by Dr. Daphine Deutscher. She comes in having last been seen 11 months ago with 21 lbs weight gain, despite an upward adjustment at that last visit. She describes having gradually increasing hunger and portion sizes. She describes some of her portions as having doubled in size. She has no regurgitation or reflux symptoms whatsoever. She's getting hungry and snacking a couple of hours after an average meal. She would like an adjustment.  VITAL SIGNS: Filed Vitals:   12/08/12 1545  BP: 132/96  Pulse: 83  Resp: 16    PHYSICAL EXAM: Physical exam reveals a very well-appearing 57 y.o.female in no apparent distress Neurologic: Awake, alert, oriented Psych: Bright affect, conversant Respiratory: Breathing even and unlabored. No stridor or wheezing Abdomen: Soft, nontender, nondistended to palpation. Incisions well-healed. No incisional hernias. Port easily palpated. Extremities: Atraumatic, good range of motion.  ASSESMENT: 57 y.o.  female  s/p AP-Standard lap-band.   PLAN: [name] port was accessed with a 20G Huber needle without difficulty. Clear fluid was aspirated and 0.5 mL saline was added to the port. The patient was able to swallow water without difficulty following the procedure and was instructed to take clear liquids for the next 24-48 hours and advance slowly as tolerated.

## 2012-12-08 NOTE — Patient Instructions (Signed)
Take clear liquids tonight. Thin protein shakes are ok to start tomorrow morning. Slowly advance your diet thereafter. Call us if you have persistent vomiting or regurgitation, night cough or reflux symptoms. Return as scheduled or sooner if you notice no changes in hunger/portion sizes.  

## 2012-12-12 ENCOUNTER — Ambulatory Visit (INDEPENDENT_AMBULATORY_CARE_PROVIDER_SITE_OTHER): Payer: 59 | Admitting: General Surgery

## 2012-12-12 ENCOUNTER — Encounter (INDEPENDENT_AMBULATORY_CARE_PROVIDER_SITE_OTHER): Payer: Self-pay | Admitting: General Surgery

## 2012-12-12 VITALS — BP 134/90 | HR 89 | Temp 98.0°F | Ht 61.0 in | Wt 205.2 lb

## 2012-12-12 DIAGNOSIS — R1319 Other dysphagia: Secondary | ICD-10-CM

## 2012-12-12 NOTE — Progress Notes (Signed)
Subjective:     Patient ID: Maureen [name], female   DOB: 09/22/1955, 58 y.o.   MRN: [id number]  HPI This patientis known to our practice for prior LAP-BAND placed in 2011 by Dr. Daphine Deutscher. She had an adjustment of 0.5 mL last Thursday and she did okay on Friday but over the last 2 days she says that she has a hard time keeping solids and liquids down. She says that over the last 2 days she has been continuously choking on her own saliva N. Has been worse when lying flat. She says that last night she was coughing all night and she has lost 9 pounds since her visit 3 days ago. She says that nothing was really been staying down last 2 days and she feels "tight" and an "ache".  Review of Systems     Objective:   Physical Exam NAD, nontoxic No abdominal pain;. Her port was accessed on the first attempt with the immediate pushback of clear fluid she had about 5 cc in the port and I removed the 0.5 cc that was placed a few days ago which was essentially at neutral    Assessment:     Dysphagia status post lap band I removed 0.5 mL states that she was able to tolerate liquids and felt much better. I recommended post lap band fill diet and followup upper GI. She will followup with Dr. Daphine Deutscher after her upper GI in about 3 weeks      Plan:     F/u UGI and follow up in 3 weeks.

## 2012-12-13 ENCOUNTER — Telehealth (INDEPENDENT_AMBULATORY_CARE_PROVIDER_SITE_OTHER): Payer: Self-pay

## 2012-12-13 ENCOUNTER — Other Ambulatory Visit (INDEPENDENT_AMBULATORY_CARE_PROVIDER_SITE_OTHER): Payer: Self-pay

## 2012-12-13 DIAGNOSIS — Z9884 Bariatric surgery status: Secondary | ICD-10-CM

## 2012-12-13 NOTE — Telephone Encounter (Signed)
Called patient with appointment date & time for UGI.  Scheduled for Monday 12/19/12 @ 9:45 am, arrive @ 9:30, NPO after midnight.

## 2012-12-19 ENCOUNTER — Other Ambulatory Visit: Payer: 59

## 2012-12-19 ENCOUNTER — Ambulatory Visit
Admission: RE | Admit: 2012-12-19 | Discharge: 2012-12-19 | Disposition: A | Discharge status: Home / Self Care | Payer: 59 | Source: Ambulatory Visit | Attending: General Surgery | Admitting: General Surgery

## 2012-12-19 DIAGNOSIS — Z9884 Bariatric surgery status: Secondary | ICD-10-CM

## 2012-12-28 ENCOUNTER — Telehealth (INDEPENDENT_AMBULATORY_CARE_PROVIDER_SITE_OTHER): Payer: Self-pay | Admitting: General Surgery

## 2012-12-28 NOTE — Telephone Encounter (Signed)
Patient called back and stated she would be here at 12:30

## 2012-12-28 NOTE — Telephone Encounter (Signed)
Called to ask patient to come in sooner for 5/1 appt...she stated that she could be here at 9

## 2012-12-29 ENCOUNTER — Encounter (INDEPENDENT_AMBULATORY_CARE_PROVIDER_SITE_OTHER): Payer: Self-pay

## 2012-12-29 ENCOUNTER — Ambulatory Visit (INDEPENDENT_AMBULATORY_CARE_PROVIDER_SITE_OTHER): Payer: 59 | Admitting: Physician Assistant

## 2012-12-29 VITALS — BP 136/90 | HR 78 | Resp 18 | Ht 61.0 in | Wt 209.0 lb

## 2012-12-29 DIAGNOSIS — Z4651 Encounter for fitting and adjustment of gastric lap band: Secondary | ICD-10-CM

## 2012-12-29 NOTE — Progress Notes (Signed)
  HISTORY: ELFRIEDA ESPINO is a 57 y.o.female who received an AP-Standard lap-band in March 2011 by Dr. Daphine Deutscher. She comes in with persistent hunger since Dr. Biagio Quint removed fluid on 4/14. She had solid food dysphagia at that time, which has resolved. In addition, she had an upper GI done the day after her fluid removal which revealed good band position and passage of contrast through the lumen. She did have some mild reflux and sluggish motility. She would like a fill today to get her weight gain under control as well as her hunger.  VITAL SIGNS: Filed Vitals:   12/29/12 1231  BP: 136/90  Pulse: 78  Resp: 18    PHYSICAL EXAM: Physical exam reveals a very well-appearing 57 y.o.female in no apparent distress Neurologic: Awake, alert, oriented Psych: Bright affect, conversant Respiratory: Breathing even and unlabored. No stridor or wheezing Abdomen: Soft, nontender, nondistended to palpation. Incisions well-healed. No incisional hernias. Port easily palpated. Extremities: Atraumatic, good range of motion.  ASSESMENT: 57 y.o.  female  s/p AP-Standard lap-band.   PLAN: [name] port was accessed with a 20G Huber needle without difficulty. Clear fluid was aspirated and 0.25 mL saline was added to the port. The patient was able to swallow water without difficulty following the procedure and was instructed to take clear liquids for the next 24-48 hours and advance slowly as tolerated. This was half the fluid added last time. I asked her to return in three months.

## 2012-12-29 NOTE — Patient Instructions (Signed)
Take clear liquids tonight. Thin protein shakes are ok to start tomorrow morning. Slowly advance your diet thereafter. Call us if you have persistent vomiting or regurgitation, night cough or reflux symptoms. Return as scheduled or sooner if you notice no changes in hunger/portion sizes.  

## 2013-01-17 ENCOUNTER — Encounter (INDEPENDENT_AMBULATORY_CARE_PROVIDER_SITE_OTHER): Payer: Self-pay | Admitting: General Surgery

## 2013-01-17 ENCOUNTER — Ambulatory Visit (INDEPENDENT_AMBULATORY_CARE_PROVIDER_SITE_OTHER): Payer: 59 | Admitting: General Surgery

## 2013-01-17 ENCOUNTER — Telehealth (INDEPENDENT_AMBULATORY_CARE_PROVIDER_SITE_OTHER): Payer: Self-pay

## 2013-01-17 VITALS — BP 128/78 | HR 76 | Temp 98.8°F | Resp 18 | Ht 61.0 in | Wt 206.2 lb

## 2013-01-17 DIAGNOSIS — Z9884 Bariatric surgery status: Secondary | ICD-10-CM | POA: Insufficient documentation

## 2013-01-17 NOTE — Progress Notes (Signed)
HISTORY:  Maureen [name] is a 57 y.o.female who received an AP-Standard lap-band in March 2011 by Dr. Daphine Deutscher.  Dr. Biagio Quint removed fluid on 4/14, 0.5cc. She had solid food dysphagia at that time. . In addition, she had an upper GI done the day after her fluid removal which revealed good band position and passage of contrast through the lumen. She did have some mild reflux and sluggish motility. And the refill half of that on May 1, 0.25 cc. After that she felt very appropriate restriction during the day and tolerated solid foods. However at nighttime she has had severe reflux and regurgitation with coughing and choking spells. She has her head of bed elevated and taking Mylanta but without relief.  BP 128/78  Pulse 76  Temp(Src) 98.8 F (37.1 C)  Resp 18  Ht 5\' 1"  (1.549 m)  Wt 206 lb 3.2 oz (93.532 kg)  BMI 38.98 kg/m2 Total weight loss 40 pounds, 3 pounds since last visit General: Well-appearing female in no distress Abdomen: Soft and nontender. Port site looks fine.  Assessment and plan: Apparent over restriction of her band with nighttime reflux and regurgitation. Upper GI series has shown no evidence of slip. I recommended a small fluid removal and we removed   0.25 cc today.  She has an appointment for about 3 weeks. She will call us if this is not give her relief.

## 2013-01-17 NOTE — Telephone Encounter (Signed)
Called and left message for patient to call our office RE:  Patient has appointment today @ 3:45 pm w/Dr. Johna Sheriff.  Please make patient aware that Dr. Johna Sheriff will not be in the office until 4:00 pm due to a delay in surgical case today.

## 2013-01-19 ENCOUNTER — Encounter (INDEPENDENT_AMBULATORY_CARE_PROVIDER_SITE_OTHER): Payer: 59

## 2013-04-06 ENCOUNTER — Encounter (INDEPENDENT_AMBULATORY_CARE_PROVIDER_SITE_OTHER): Payer: 59

## 2013-04-27 ENCOUNTER — Ambulatory Visit (INDEPENDENT_AMBULATORY_CARE_PROVIDER_SITE_OTHER): Payer: 59 | Admitting: Physician Assistant

## 2013-04-27 ENCOUNTER — Encounter (INDEPENDENT_AMBULATORY_CARE_PROVIDER_SITE_OTHER): Payer: Self-pay

## 2013-04-27 VITALS — BP 156/100 | HR 80 | Temp 97.7°F | Resp 16 | Ht 61.0 in | Wt 209.8 lb

## 2013-04-27 DIAGNOSIS — Z4651 Encounter for fitting and adjustment of gastric lap band: Secondary | ICD-10-CM

## 2013-04-27 NOTE — Patient Instructions (Signed)

## 2013-04-27 NOTE — Progress Notes (Signed)
  HISTORY: Maureen [name] is a 57 y.o.female who received an AP-Standard lap-band in March 2011 by Dr. Daphine Deutscher. The patient comes in today with a 3.6 pounds of weight gain since her last visit on May 20 of this year. She had 0.25 mL of fluid removed from her band at that time for reflux symptoms. Since then she has no further obstructive symptoms. She complains of increased hunger and larger than desired portion sizes. She would like an adjustment today but very little fluid.She is exercising on a regular basis.  VITAL SIGNS: Filed Vitals:   04/27/13 1507  BP: 156/100  Pulse: 80  Temp: 97.7 F (36.5 C)  Resp: 16    PHYSICAL EXAM: Physical exam reveals a very well-appearing 57 y.o.female in no apparent distress Neurologic: Awake, alert, oriented Psych: Bright affect, conversant Respiratory: Breathing even and unlabored. No stridor or wheezing Abdomen: Soft, nontender, nondistended to palpation. Incisions well-healed. No incisional hernias. Port easily palpated. Extremities: Atraumatic, good range of motion.  ASSESMENT: 57 y.o.  female  s/p AP-Standard lap-band.   PLAN: [name] port was accessed with a 20G Huber needle without difficulty. Clear fluid was aspirated and 0.15 mL saline was added to the port. The patient was able to swallow water without difficulty following the procedure and was instructed to take clear liquids for the next 24-48 hours and advance slowly as tolerated.

## 2013-05-30 ENCOUNTER — Encounter (INDEPENDENT_AMBULATORY_CARE_PROVIDER_SITE_OTHER): Payer: Self-pay

## 2013-09-11 ENCOUNTER — Other Ambulatory Visit: Payer: Self-pay | Admitting: Physician Assistant

## 2013-09-11 MED ORDER — VENLAFAXINE HCL ER 75 MG PO CP24: 75.0000 mg | ORAL_CAPSULE | Freq: Every day | ORAL | Status: DC

## 2013-10-08 DIAGNOSIS — T7840XA Allergy, unspecified, initial encounter: Secondary | ICD-10-CM | POA: Insufficient documentation

## 2013-10-08 DIAGNOSIS — I10 Essential (primary) hypertension: Secondary | ICD-10-CM | POA: Insufficient documentation

## 2013-10-08 DIAGNOSIS — K219 Gastro-esophageal reflux disease without esophagitis: Secondary | ICD-10-CM | POA: Insufficient documentation

## 2013-10-08 DIAGNOSIS — M1990 Unspecified osteoarthritis, unspecified site: Secondary | ICD-10-CM | POA: Insufficient documentation

## 2013-10-08 DIAGNOSIS — K589 Irritable bowel syndrome without diarrhea: Secondary | ICD-10-CM | POA: Insufficient documentation

## 2013-10-11 ENCOUNTER — Encounter: Payer: Self-pay | Admitting: Physician Assistant

## 2013-10-11 ENCOUNTER — Other Ambulatory Visit: Payer: Self-pay | Admitting: Physician Assistant

## 2013-10-11 ENCOUNTER — Ambulatory Visit (INDEPENDENT_AMBULATORY_CARE_PROVIDER_SITE_OTHER): Payer: 59 | Admitting: Physician Assistant

## 2013-10-11 VITALS — BP 150/98 | HR 88 | Temp 98.2°F | Resp 18 | Ht 60.25 in | Wt 209.0 lb

## 2013-10-11 DIAGNOSIS — Z0000 Encounter for general adult medical examination without abnormal findings: Secondary | ICD-10-CM

## 2013-10-11 DIAGNOSIS — I10 Essential (primary) hypertension: Secondary | ICD-10-CM

## 2013-10-11 NOTE — Progress Notes (Signed)
Complete Physical HPI 58 y.o. female  presents for a complete physical. Her blood pressure has been controlled at home, today their BP is BP: 150/98 mmHg She denies chest pain, shortness of breath, dizziness.  Her cholesterol is diet controlled. Her cholesterol is controlled. The cholesterol last visit was: LDL 75, trigs 234, HDL 79.  She has been working on diet and exercise for prediabetes, and denies blurry vision, polydipsia, polyphagia and polyuria. Last A1C in the office was: 5.7 Patient is on Vitamin D supplements. Vit D 34.4  Current Medications:  Current Outpatient Prescriptions on File Prior to Visit  Medication Sig Dispense Refill  . Cholecalciferol (VITAMIN D-3 PO) Take by mouth daily.      Marland Kitchen loratadine (CLARITIN) 10 MG tablet Take 10 mg by mouth daily.      . meloxicam (MOBIC) 15 MG tablet Take 15 mg by mouth daily.        . Multiple Vitamin (MULTIVITAMIN) capsule Take 1 capsule by mouth daily.      . Pantoprazole Sodium (PROTONIX PO) Take 20 mg by mouth 2 (two) times daily.       Marland Kitchen venlafaxine XR (EFFEXOR-XR) 75 MG 24 hr capsule Take 1 capsule (75 mg total) by mouth daily with breakfast.  90 capsule  1   No current facility-administered medications on file prior to visit.   Health Maintenance:  Tetanus: 2007  Pneumovax:N/A Flu vaccine: 07/2013 Zostavax: N/A Pap:2014 normal MGM: 05/2013 neg +FBD- states she saw (Dr. Redmond Pulling) found mass on Right breast suppose to get U/S DEXA: 2010 normal- non smoker, no family history, BMI elevated, no broken bones- low risk.  Colonoscopy: 2009 normal due 2019 (Dr. Collene Mares)  EGD: N/A  Allergies: No Known Allergies Medical History:  Past Medical History  Diagnosis Date  . IBS (irritable bowel syndrome)     Has not been a problem since sx  . GERD (gastroesophageal reflux disease)   . Hypertension   . Allergy   . Asthma   . Arthritis   . H/O dysplastic nevus   . OSA (obstructive sleep apnea)     resolved with Lap Band   Surgical  History:  Past Surgical History  Procedure Laterality Date  . Abdominal hysterectomy  2004  . Cholecystectomy  2003  . Breast surgery  1972    benign tumor  . Laparoscopic gastric banding  11/05/09    Dr. Hassell Done   Family History:  Family History  Problem Relation Age of Onset  . Hyperlipidemia Father   . Heart disease Father    Social History:  History   Social History  . Marital Status: Married    Spouse Name: N/A    Number of Children: N/A  . Years of Education: N/A   Occupational History  . Not on file.   Social History Main Topics  . Smoking status: Former Smoker -- 0.25 packs/day    Types: Cigarettes    Quit date: 08/31/1998  . Smokeless tobacco: Never Used  . Alcohol Use: No  . Drug Use: No  . Sexual Activity: Not on file   Other Topics Concern  . Not on file   Social History Narrative  . No narrative on file   ROS Constitutional: Denies weight loss/gain, headaches, insomnia, fatigue, night sweats, and change in appetite. Eyes: DEE 01/2013 yearly (Dr. Gaylyn Cheers) - normal  Denies redness, blurred vision, diplopia, discharge, itchy, watery eyes.  ENT: Denies discharge, congestion, post nasal drip, sore throat, earache, hearing loss, dental pain, Tinnitus, Vertigo,  Sinus pain, snoring.  Cardio: Denies chest pain, palpitations, irregular heartbeat, dyspnea, diaphoresis, orthopnea, PND, claudication, edema Respiratory: denies cough, dyspnea, pleurisy, hoarseness, wheezing.  Gastrointestinal: (Dr. Collene Mares) Denies dysphagia, heartburn, pain, cramps, nausea, vomiting, bloating, diarrhea, constipation, hematemesis, melena, hematochezia, hemorrhoids Genitourinary: (Dr. Redmond Pulling) Denies dysuria, frequency, urgency, nocturia, hesitancy, discharge, hematuria, flank pain Breast: Denies Breast lumps, nipple discharge, bleeding.  Musculoskeletal: Denies arthralgia, myalgia, stiffness, Jt. Swelling, pain, Skin: Denies pruritis, rash, hives,  acne, eczema, changing in skin lesion Neuro:  Denies Weakness, tremor, incoordination, spasms, paresthesia, pain Psychiatric: Denies confusion, memory loss, sensory loss Endocrine: Denies change in weight, skin, hair change, nocturia, and paresthesia, Diabetic Denies Polys, visual blurring, hyper /hypo glycemic episodes.  Heme/Lymph: Denies Excessive bleeding, bruising, enlarged lymph nodes  Physical Exam: Estimated body mass index is 40.5 kg/(m^2) as calculated from the following:   Height as of this encounter: 5' 0.25" (1.53 m).   Weight as of this encounter: 209 lb (94.802 kg).- Last year was 84 and year before that was 188.  Filed Vitals:   10/11/13 1412  BP: 150/98  Pulse: 88  Temp: 98.2 F (36.8 C)  Resp: 18   General Appearance: Well nourished, in no apparent distress. Eyes: PERRLA, EOMs, conjunctiva no swelling or erythema, normal fundi and vessels. Sinuses: No Frontal/maxillary tenderness ENT/Mouth: Ext aud canals clear, normal light reflex with TMs without erythema, bulging.  Good dentition. No erythema, swelling, or exudate on post pharynx. Tonsils not swollen or erythematous. Hearing normal.  Neck: Supple, thyroid normal. No bruits Respiratory: Respiratory effort normal, BS equal bilaterally without rales, rhonchi, wheezing or stridor. Cardio: RRR without murmurs, rubs or gallops. Brisk peripheral pulses without edema.  Chest: symmetric, with normal excursions and percussion. Breasts: defer Abdomen: Soft, +BS. Non tender, no guarding, rebound, hernias, masses, or organomegaly. .  Lymphatics: Non tender without lymphadenopathy.  Genitourinary: defer Musculoskeletal: Full ROM all peripheral extremities,5/5 strength, and normal gait. Skin: Warm, dry without rashes, lesions, ecchymosis.  Neuro: Cranial nerves intact, reflexes equal bilaterally. Normal muscle tone, no cerebellar symptoms. Sensation intact.  Psych: Awake and oriented X 3, normal affect, Insight and Judgment appropriate.   EKG: WNL no  changes.  Assessment and Plan: IBS (irritable bowel syndrome)  GERD (gastroesophageal reflux disease)- due to lap band.   Hypertension- not at goal today- states at other doctor it was good- will monitor and follow up in one month.   Allergy- controlled  Asthma- controlled  Arthritis- stable  H/O dysplastic nevus- stable  OSA (obstructive sleep apnea)- better with lap band/weight loss  Health Maintenance  Discussed med's effects and SE's. Screening labs and tests as requested with regular follow-up as recommended.   [name] 2:49 PM

## 2013-10-11 NOTE — Patient Instructions (Signed)

## 2013-10-12 LAB — MICROALBUMIN, URINE: Microalbumin, Urine: 6.2 ug/mL (ref 0.0–17.0)

## 2013-10-12 LAB — URINALYSIS, ROUTINE W REFLEX MICROSCOPIC
Bilirubin, UA: NEGATIVE
GLUCOSE, UA: NEGATIVE
KETONES UA: NEGATIVE
LEUKOCYTES UA: NEGATIVE
NITRITE UA: NEGATIVE
Protein, UA: NEGATIVE
RBC UA: NEGATIVE
Specific Gravity, UA: 1.02 (ref 1.005–1.030)
Urobilinogen, Ur: 0.2 mg/dL (ref 0.0–1.9)
pH, UA: 6 (ref 5.0–7.5)

## 2013-10-12 LAB — CBC WITH DIFFERENTIAL
BASOS: 0 %
Basophils Absolute: 0 10*3/uL (ref 0.0–0.2)
EOS: 2 %
Eosinophils Absolute: 0.1 10*3/uL (ref 0.0–0.4)
HEMATOCRIT: 39.7 % (ref 34.0–46.6)
HEMOGLOBIN: 13.3 g/dL (ref 11.1–15.9)
Immature Grans (Abs): 0 10*3/uL (ref 0.0–0.1)
Immature Granulocytes: 0 %
LYMPHS: 39 %
Lymphocytes Absolute: 2.2 10*3/uL (ref 0.7–3.1)
MCH: 30.2 pg (ref 26.6–33.0)
MCHC: 33.5 g/dL (ref 31.5–35.7)
MCV: 90 fL (ref 79–97)
MONOCYTES: 7 %
Monocytes Absolute: 0.4 10*3/uL (ref 0.1–0.9)
Neutrophils Absolute: 3 10*3/uL (ref 1.4–7.0)
Neutrophils Relative %: 52 %
PLATELETS: 349 10*3/uL (ref 150–379)
RBC: 4.41 x10E6/uL (ref 3.77–5.28)
RDW: 14.8 % (ref 12.3–15.4)
WBC: 5.7 10*3/uL (ref 3.4–10.8)

## 2013-10-12 LAB — BASIC METABOLIC PANEL
BUN / CREAT RATIO: 25 — AB (ref 9–23)
BUN: 15 mg/dL (ref 6–24)
CALCIUM: 9.9 mg/dL (ref 8.7–10.2)
CO2: 28 mmol/L (ref 18–29)
Chloride: 98 mmol/L (ref 97–108)
Creatinine, Ser: 0.61 mg/dL (ref 0.57–1.00)
GFR, EST AFRICAN AMERICAN: 116 mL/min/{1.73_m2} (ref >59)
GFR, EST NON AFRICAN AMERICAN: 100 mL/min/{1.73_m2} (ref >59)
GLUCOSE: 92 mg/dL (ref 65–99)
POTASSIUM: 4.1 mmol/L (ref 3.5–5.2)
Sodium: 141 mmol/L (ref 134–144)

## 2013-10-12 LAB — VITAMIN B12: Vitamin B-12: 910 pg/mL (ref 211–946)

## 2013-10-12 LAB — MAGNESIUM: Magnesium: 1.9 mg/dL (ref 1.6–2.6)

## 2013-10-12 LAB — HEPATIC FUNCTION PANEL
ALBUMIN: 4 g/dL (ref 3.5–5.5)
ALT: 24 IU/L (ref 0–32)
AST: 19 IU/L (ref 0–40)
Alkaline Phosphatase: 89 IU/L (ref 39–117)
BILIRUBIN DIRECT: 0.09 mg/dL (ref 0.00–0.40)
BILIRUBIN TOTAL: 0.2 mg/dL (ref 0.0–1.2)
TOTAL PROTEIN: 6.3 g/dL (ref 6.0–8.5)

## 2013-10-12 LAB — VITAMIN D 25 HYDROXY (VIT D DEFICIENCY, FRACTURES): VIT D 25 HYDROXY: 50.2 ng/mL (ref 30.0–100.0)

## 2013-10-12 LAB — INSULIN, FASTING: INSULIN: 12.3 u[IU]/mL (ref 2.6–24.9)

## 2013-10-12 LAB — IRON: Iron: 51 ug/dL (ref 35–155)

## 2013-10-12 LAB — HGB A1C W/O EAG: Hgb A1c MFr Bld: 5.9 % — ABNORMAL HIGH (ref 4.8–5.6)

## 2013-10-12 LAB — CHOLESTEROL, TOTAL: Cholesterol, Total: 165 mg/dL (ref 100–199)

## 2013-10-12 LAB — TSH: TSH: 1.45 u[IU]/mL (ref 0.450–4.500)

## 2013-11-13 ENCOUNTER — Other Ambulatory Visit: Payer: Self-pay | Admitting: Physician Assistant

## 2013-11-15 ENCOUNTER — Other Ambulatory Visit: Payer: Self-pay | Admitting: Physician Assistant

## 2013-12-07 ENCOUNTER — Other Ambulatory Visit: Payer: Self-pay | Admitting: Physician Assistant

## 2014-06-29 ENCOUNTER — Other Ambulatory Visit: Payer: Self-pay | Admitting: Physician Assistant

## 2014-06-30 ENCOUNTER — Other Ambulatory Visit: Payer: Self-pay | Admitting: Physician Assistant

## 2014-07-04 ENCOUNTER — Other Ambulatory Visit: Payer: Self-pay | Admitting: Physician Assistant

## 2014-07-05 ENCOUNTER — Other Ambulatory Visit: Payer: Self-pay | Admitting: Physician Assistant

## 2014-09-27 ENCOUNTER — Other Ambulatory Visit: Payer: Self-pay | Admitting: Internal Medicine

## 2014-10-15 ENCOUNTER — Encounter: Payer: Self-pay | Admitting: Physician Assistant

## 2014-10-26 ENCOUNTER — Other Ambulatory Visit (INDEPENDENT_AMBULATORY_CARE_PROVIDER_SITE_OTHER): Payer: Self-pay

## 2014-10-26 DIAGNOSIS — Z9884 Bariatric surgery status: Secondary | ICD-10-CM

## 2014-10-26 DIAGNOSIS — R1310 Dysphagia, unspecified: Secondary | ICD-10-CM

## 2014-10-31 ENCOUNTER — Other Ambulatory Visit (INDEPENDENT_AMBULATORY_CARE_PROVIDER_SITE_OTHER): Payer: Self-pay | Admitting: *Deleted

## 2014-10-31 ENCOUNTER — Other Ambulatory Visit (INDEPENDENT_AMBULATORY_CARE_PROVIDER_SITE_OTHER): Payer: Self-pay

## 2014-11-01 ENCOUNTER — Other Ambulatory Visit (INDEPENDENT_AMBULATORY_CARE_PROVIDER_SITE_OTHER): Payer: Self-pay | Admitting: *Deleted

## 2014-11-02 ENCOUNTER — Other Ambulatory Visit (INDEPENDENT_AMBULATORY_CARE_PROVIDER_SITE_OTHER): Payer: Self-pay | Admitting: *Deleted

## 2014-11-08 ENCOUNTER — Other Ambulatory Visit: Payer: Commercial Managed Care - PPO

## 2014-11-13 ENCOUNTER — Ambulatory Visit
Admission: RE | Admit: 2014-11-13 | Discharge: 2014-11-13 | Disposition: A | Discharge status: Home / Self Care | Payer: 59 | Source: Ambulatory Visit | Attending: General Surgery | Admitting: General Surgery

## 2014-12-03 ENCOUNTER — Encounter: Payer: Self-pay | Admitting: Physician Assistant

## 2014-12-03 ENCOUNTER — Ambulatory Visit: Payer: 59 | Admitting: Physician Assistant

## 2014-12-03 VITALS — BP 140/98 | HR 78 | Temp 98.0°F | Resp 18 | Ht 60.0 in | Wt 222.0 lb

## 2014-12-03 DIAGNOSIS — Z79899 Other long term (current) drug therapy: Secondary | ICD-10-CM

## 2014-12-03 DIAGNOSIS — R7303 Prediabetes: Secondary | ICD-10-CM | POA: Insufficient documentation

## 2014-12-03 DIAGNOSIS — K2100 Gastro-esophageal reflux disease with esophagitis, without bleeding: Secondary | ICD-10-CM

## 2014-12-03 DIAGNOSIS — I10 Essential (primary) hypertension: Secondary | ICD-10-CM

## 2014-12-03 DIAGNOSIS — Z9884 Bariatric surgery status: Secondary | ICD-10-CM

## 2014-12-03 DIAGNOSIS — E559 Vitamin D deficiency, unspecified: Secondary | ICD-10-CM | POA: Insufficient documentation

## 2014-12-03 DIAGNOSIS — K589 Irritable bowel syndrome without diarrhea: Secondary | ICD-10-CM

## 2014-12-03 DIAGNOSIS — J45909 Unspecified asthma, uncomplicated: Secondary | ICD-10-CM

## 2014-12-03 DIAGNOSIS — M1990 Unspecified osteoarthritis, unspecified site: Secondary | ICD-10-CM

## 2014-12-03 DIAGNOSIS — T7840XD Allergy, unspecified, subsequent encounter: Secondary | ICD-10-CM

## 2014-12-03 DIAGNOSIS — E785 Hyperlipidemia, unspecified: Secondary | ICD-10-CM | POA: Insufficient documentation

## 2014-12-03 DIAGNOSIS — Z1329 Encounter for screening for other suspected endocrine disorder: Secondary | ICD-10-CM | POA: Insufficient documentation

## 2014-12-03 MED ORDER — VENLAFAXINE HCL ER 75 MG PO CP24: ORAL_CAPSULE | ORAL | Status: DC

## 2014-12-03 MED ORDER — MELOXICAM 7.5 MG PO TABS: ORAL_TABLET | ORAL | Status: DC

## 2014-12-03 NOTE — Patient Instructions (Addendum)
Before you even begin to attack a weight-loss plan, it pays to remember this: You are not fat. You have fat. Losing weight isn't about blame or shame; it's simply another achievement to accomplish. Dieting is like any other skill-you have to buckle down and work at it. As long as you act in a smart, reasonable way, you'll ultimately get where you want to be. Here are some weight loss pearls for you.  1. It's Not a Diet. It's a Lifestyle Thinking of a diet as something you're on and suffering through only for the short term doesn't work. To shed weight and keep it off, you need to make permanent changes to the way you eat. It's OK to indulge occasionally, of course, but if you cut calories temporarily and then revert to your old way of eating, you'll gain back the weight quicker than you can say yo-yo. Use it to lose it. Research shows that one of the best predictors of long-term weight loss is how many pounds you drop in the first month. For that reason, nutritionists often suggest being stricter for the first two weeks of your new eating strategy to build momentum. Cut out added sugar and alcohol and avoid unrefined carbs. After that, figure out how you can reincorporate them in a way that's healthy and maintainable.  2. There's a Right Way to Exercise Working out burns calories and fat and boosts your metabolism by building muscle. But those trying to lose weight are notorious for overestimating the number of calories they burn and underestimating the amount they take in. Unfortunately, your system is biologically programmed to hold on to extra pounds and that means when you start exercising, your body senses the deficit and ramps up its hunger signals. If you're not diligent, you'll eat everything you burn and then some. Use it to lose it. Cardio gets all the exercise glory, but strength and interval training are the real heroes. They help you build lean muscle, which in turn increases your metabolism and  calorie-burning ability 3. Don't Overreact to Mild Hunger Some people have a hard time losing weight because of hunger anxiety. To them, being hungry is bad-something to be avoided at all costs-so they carry snacks with them and eat when they don't need to. Others eat because they're stressed out or bored. While you never want to get to the point of being ravenous (that's when bingeing is likely to happen), a hunger pang, a craving, or the fact that it's 3:00 p.m. should not send you racing for the vending machine or obsessing about the energy bar in your purse. Ideally, you should put off eating until your stomach is growling and it's difficult to concentrate.  Use it to lose it. When you feel the urge to eat, use the HALT method. Ask yourself, Am I really hungry? Or am I angry or anxious, lonely or bored, or tired? If you're still not certain, try the apple test. If you're truly hungry, an apple should seem delicious; if it doesn't, something else is going on. Or you can try drinking water and making yourself busy, if you are still hungry try a healthy snack.  4. Not All Calories Are Created Equal The mechanics of weight loss are pretty simple: Take in fewer calories than you use for energy. But the kind of food you eat makes all the difference. Processed food that's high in saturated fat and refined starch or sugar can cause inflammation that disrupts the hormone signals that tell  your brain you're full. The result: You eat a lot more.  Use it to lose it. Clean up your diet. Swap in whole, unprocessed foods, including vegetables, lean protein, and healthy fats that will fill you up and give you the biggest nutritional bang for your calorie buck. In a few weeks, as your brain starts receiving regular hunger and fullness signals once again, you'll notice that you feel less hungry overall and naturally start cutting back on the amount you eat.  5. Protein, Produce, and Plant-Based Fats Are Your Weight-Loss  [facility] Here's why eating the three Ps regularly will help you drop pounds. Protein fills you up. You need it to build lean muscle, which keeps your metabolism humming so that you can torch more fat. People in a weight-loss program who ate double the recommended daily allowance for protein (about 110 grams for a 150-pound woman) lost 70 percent of their weight from fat, while people who ate the RDA lost only about 40 percent, one study found. Produce is packed with filling fiber. "It's very difficult to consume too many calories if you're eating a lot of vegetables. Example: Three cups of broccoli is a lot of food, yet only 93 calories. (Fruit is another story. It can be easy to overeat and can contain a lot of calories from sugar, so be sure to monitor your intake.) Plant-based fats like olive oil and those in avocados and nuts are healthy and extra satiating.  Use it to lose it. Aim to incorporate each of the three Ps into every meal and snack. People who eat protein throughout the day are able to keep weight off, according to a study in the American Journal of Clinical Nutrition. In addition to meat, poultry and seafood, good sources are beans, lentils, eggs, tofu, and yogurt. As for fat, keep portion sizes in check by measuring out salad dressing, oil, and nut butters (shoot for one to two tablespoons). Finally, eat veggies or a little fruit at every meal. People who did that consumed 308 fewer calories but didn't feel any hungrier than when they didn't eat more produce.  7. How You Eat Is As Important As What You Eat In order for your brain to register that you're full, you need to focus on what you're eating. Sit down whenever you eat, preferably at a table. Turn off the TV or computer, put down your phone, and look at your food. Smell it. Chew slowly, and don't put another bite on your fork until you swallow. When women ate lunch this attentively, they consumed 30 percent less when snacking later than  those who listened to an audiobook at lunchtime, according to a study in the [employer] Journal of Nutrition. 8. Weighing Yourself Really Works The scale provides the best evidence about whether your efforts are paying off. Seeing the numbers tick up or down or stagnate is motivation to keep going-or to rethink your approach. A 2015 study at [employer] found that daily weigh-ins helped people lose more weight, keep it off, and maintain that loss, even after two years. Use it to lose it. Step on the scale at the same time every day for the best results. If your weight shoots up several pounds from one weigh-in to the next, don't freak out. Eating a lot of salt the night before or having your period is the likely culprit. The number should return to normal in a day or two. It's a steady climb that you need to do something about.   9. Too Much Stress and Too Little Sleep Are Your Enemies When you're tired and frazzled, your body cranks up the production of cortisol, the stress hormone that can cause carb cravings. Not getting enough sleep also boosts your levels of ghrelin, a hormone associated with hunger, while suppressing leptin, a hormone that signals fullness and satiety. People on a diet who slept only five and a half hours a night for two weeks lost 55 percent less fat and were hungrier than those who slept eight and a half hours, according to a study in the [employer] Journal. Use it to lose it. Prioritize sleep, aiming for seven hours or more a night, which research shows helps lower stress. And make sure you're getting quality zzz's. If a snoring spouse or a fidgety cat wakes you up frequently throughout the night, you may end up getting the equivalent of just four hours of sleep, according to a study from [employer]. Keep pets out of the bedroom, and use a white-noise app to drown out snoring. 10. You Will Hit a plateau-And You Can Bust Through It As you slim down, your  body releases much less leptin, the fullness hormone.  If you're not strength training, start right now. Building muscle can raise your metabolism to help you overcome a plateau. To keep your body challenged and burning calories, incorporate new moves and more intense intervals into your workouts or add another sweat session to your weekly routine. Alternatively, cut an extra 100 calories or so a day from your diet. Now that you've lost weight, your body simply doesn't need as much fuel.   Ways to cut 100 calories  1. Eat your eggs with hot sauce OR salsa instead of cheese.  Eggs are great for breakfast, but many people consider eggs and cheese to be BFFs. Instead of cheese-1 oz. of cheddar has 114 calories-top your eggs with hot sauce, which contains no calories and helps with satiety and metabolism. Salsa is also a great option!!  2. Top your toast, waffles or pancakes with mashed berries instead of jelly or syrup. Half a cup of berries-fresh, frozen or thawed-has about 40 calories, compared with 2 tbsp. of maple syrup or jelly, which both have about 100 calories. The berries will also give you a good punch of fiber, which helps keep you full and satisfied and won't spike blood sugar quickly like the jelly or syrup. 3. Swap the non-fat latte for black coffee with a splash of half-and-half. Contrary to its name, that non-fat latte has 130 calories and a startling 19g of carbohydrates per 16 oz. serving. Replacing that 'light' drinkable dessert with a black coffee with a splash of half-and-half saves you more than 100 calories per 16 oz. serving. 4. Sprinkle salads with freeze-dried raspberries instead of dried cranberries. If you want a sweet addition to your nutritious salad, stay away from dried cranberries. They have a whopping 130 calories per  cup and 30g carbohydrates. Instead, sprinkle freeze-dried raspberries guilt-free and save more than 100 calories per  cup serving, adding 3g of belly-filling  fiber. 5. Go for mustard in place of mayo on your sandwich. Mustard can add really nice flavor to any sandwich, and there are tons of varieties, from spicy to honey. A serving of mayo is 95 calories, versus 10 calories in a serving of mustard. 6. Choose a DIY salad dressing instead of the store-bought kind. Mix Dijon or whole grain mustard with low-fat Kefir or red wine vinegar   and garlic. 7. Use hummus as a spread instead of a dip. Use hummus as a spread on a high-fiber cracker or tortilla with a sandwich and save on calories without sacrificing taste. 8. Pick just one salad "accessory." Salad isn't automatically a calorie winner. It's easy to over-accessorize with toppings. Instead of topping your salad with nuts, avocado and cranberries (all three will clock in at 313 calories), just pick one. The next day, choose a different accessory, which will also keep your salad interesting. You don't wear all your jewelry every day, right? 9. Ditch the white pasta in favor of spaghetti squash. One cup of cooked spaghetti squash has about 40 calories, compared with traditional spaghetti, which comes with more than 200. Spaghetti squash is also nutrient-dense. It's a good source of fiber and Vitamins A and C, and it can be eaten just like you would eat pasta-with a great tomato sauce and [place] meatballs or with pesto, tofu and spinach, for example. 10. Dress up your chili, soups and stews with non-fat [place] yogurt instead of sour cream. Just a 'dollop' of sour cream can set you back 115 calories and a whopping 12g of fat-seven of which are of the artery-clogging variety. Added bonus: [place] yogurt is packed with muscle-building protein, calcium and B Vitamins. 11. Mash cauliflower instead of mashed potatoes. One cup of traditional mashed potatoes-in all their creamy goodness-has more than 200 calories, compared to mashed cauliflower, which you can typically eat for less than 100 calories per 1 cup serving.  Cauliflower is a great source of the antioxidant indole-3-carbinol (I3C), which may help reduce the risk of some cancers, like breast cancer. 12. Ditch the ice cream sundae in favor of a [place] yogurt parfait. Instead of a cup of ice cream or fro-yo for dessert, try 1 cup of nonfat Greek yogurt topped with fresh berries and a sprinkle of cacao nibs. Both toppings are packed with antioxidants, which can help reduce cellular inflammation and oxidative damage. And the comparison is a no-brainer: One cup of ice cream has about 275 calories; one cup of frozen yogurt has about 230; and a cup of Greek yogurt has just 130, plus twice the protein, so you're less likely to return to the freezer for a second helping. 13. Put olive oil in a spray container instead of using it directly from the bottle. Each tablespoon of olive oil is 120 calories and 15g of fat. Use a mister instead of pouring it straight into the pan or onto a salad. This allows for portion control and will save you more than 100 calories. 14. When baking, substitute canned pumpkin for butter or oil. Canned pumpkin-not pumpkin pie mix-is loaded with Vitamin A, which is important for skin and eye health, as well as immunity. And the comparisons are pretty crazy:  cup of canned pumpkin has about 40 calories, compared to butter or oil, which has more than 800 calories. Yes, 800 calories. Applesauce and mashed banana can also serve as good substitutions for butter or oil, usually in a 1:1 ratio. 15. Top casseroles with high-fiber cereal instead of breadcrumbs. Breadcrumbs are typically made with white bread, while breakfast cereals contain 5-9g of fiber per serving. Not only will you save more than 150 calories per  cup serving, the swap will also keep you more full and you'll get a metabolism boost from the added fiber. 16. Snack on pistachios instead of macadamia nuts. Believe it or not, you get the same amount of calories from 35  pistachios (100  calories) as you would from only five macadamia nuts. 17. Chow down on kale chips rather than potato chips. This is my favorite 'don't knock it 'till you try it' swap. Kale chips are so easy to make at home, and you can spice them up with a little grated parmesan or chili powder. Plus, they're a mere fraction of the calories of potato chips, but with the same crunch factor we crave so often. 18. Add seltzer and some fruit slices to your cocktail instead of soda or fruit juice. One cup of soda or fruit juice can pack on as much as 140 calories. Instead, use seltzer and fruit slices. The fruit provides valuable phytochemicals, such as flavonoids and anthocyanins, which help to combat cancer and stave off the aging process.  Nexium/protonix/prilosec are called PPI's, they are great at healing your stomach but should only be taken for a short period of time.   Studies are showing that taken for a long time it can increase the risk of osteoporosis (weakening of your bones), pneumonia, low magnesium, restless legs, Cdiff (infection that causes diarrhea), and most recently kidney disease/insufficiency.  Due to this information we want to try to stop the PPI but if you try to stop it abruptly this can cause rebound acid and worsening symptoms.   So this is how we want you to get off the PPI: - Start taking the nexium/protonix/prilosec/PPI  every other day with pepcid or zantac (generic is fine) 2 x a day for 2 weeks - then decrease the PPI to every 3 days while taking the zantac or pepcid twice a day the other 2 days for 2 weeks - then you can try the zantac or pepcid once at night for 2 weeks - you can continue on this once at night or stop all together - Avoid alcohol, spicy foods, NSAIDS (aleve, ibuprofen) at this time. See foods below.   Food Choices for Gastroesophageal Reflux Disease When you have gastroesophageal reflux disease (GERD), the foods you eat and your eating habits are very important.  Choosing the right foods can help ease the discomfort of GERD. WHAT GENERAL GUIDELINES DO I NEED TO FOLLOW?  Choose fruits, vegetables, whole grains, low-fat dairy products, and low-fat meat, fish, and poultry.  Limit fats such as oils, salad dressings, butter, nuts, and avocado.  Keep a food diary to identify foods that cause symptoms.  Avoid foods that cause reflux. These may be different for different people.  Eat frequent small meals instead of three large meals each day.  Eat your meals slowly, in a relaxed setting.  Limit fried foods.  Cook foods using methods other than frying.  Avoid drinking alcohol.  Avoid drinking large amounts of liquids with your meals.  Avoid bending over or lying down until 2-3 hours after eating. WHAT FOODS ARE NOT RECOMMENDED? The following are some foods and drinks that may worsen your symptoms: Vegetables Tomatoes. Tomato juice. Tomato and spaghetti sauce. Chili peppers. Onion and garlic. Horseradish. Fruits Oranges, grapefruit, and lemon (fruit and juice). Meats High-fat meats, fish, and poultry. This includes hot dogs, ribs, ham, sausage, salami, and bacon. Dairy Whole milk and chocolate milk. Sour cream. Cream. Butter. Ice cream. Cream cheese.  Beverages Coffee and tea, with or without caffeine. Carbonated beverages or energy drinks. Condiments Hot sauce. Barbecue sauce.  Sweets/Desserts Chocolate and cocoa. Donuts. Peppermint and spearmint. Fats and Oils High-fat foods, including [place] fries and potato chips. Other Vinegar. Strong spices, such as black  pepper, white pepper, red pepper, cayenne, curry powder, cloves, ginger, and chili powder. Nexium/protonix/prilosec are called PPI's, they are great at healing your stomach but should only be taken for a short period of time.

## 2014-12-03 NOTE — Progress Notes (Signed)
Complete Physical  Assessment and Plan: 1. Essential hypertension - continue medications, DASH diet, exercise and monitor at home. Call if greater than 130/80.  - EKG 12-Lead  2. Prediabetes Discussed general issues about diabetes pathophysiology and management., Educational material distributed., Suggested low cholesterol diet., Encouraged aerobic exercise., Discussed foot care., Reminded to get yearly retinal exam.  3. Hyperlipidemia -continue medications, check lipids, decrease fatty foods, increase activity.  4. Morbid obesity Obesity with co morbidities- long discussion about weight loss, diet, and exercise  5. LAP-BAND surgery status Follow up Dr. Hassell Done  6. Asthma, unspecified asthma severity, uncomplicated Asthma controlled  7. IBS (irritable bowel syndrome) Diet controlled  8. Gastroesophageal reflux disease with esophagitis Suppose to be evaluated for H/H  9. Arthritis RICE, NSAIDS, exercises given, if not better get xray and PT referral or ortho referral.   10. Allergy, subsequent encounter Continue OTC allergy pills  11. Vitamin D deficiency Continue supplement  12. Medication management  Discussed med's effects and SE's. Screening labs and tests as requested with regular follow-up as recommended.Gets labs done at [facility]  HPI 59 y.o. female  presents for a complete physical. Her blood pressure has been controlled at home, runs 120's at home, today their BP is BP: (!) 140/98 mmHg. She has been exercising at work on a treadmill. She denies chest pain, shortness of breath, dizziness.  Her cholesterol is diet controlled. Her cholesterol is controlled. The cholesterol last visit was:  Lab Results  Component Value Date   CHOL 165 10/11/2013  She has been working on diet and exercise for prediabetes, and denies blurry vision, polydipsia, polyphagia and polyuria. Last A1C in the office was: Lab Results  Component Value Date   HGBA1C 5.9* 10/11/2013  Patient  is on Vitamin D supplements.  Lab Results  Component Value Date   VD25OH 50.2 10/11/2013  She is on protonix for GERD. She is on effexor for IBS/diarrhea, she feels that it is not helping but would like to stay on it to prevent hot flashes.  Works at [employer], gets labs there.   She has lower back pain with sciatica left leg occ, on meloxicam.  She has a history of OSA but it resolved with her lap band surgery however she states that she has been having issues with her lap band for several years and has gained back a lot of her weight. She follows with [facility], has been having severe GERD, coughing at night, + symptos, found hiatal hernia and she is planning of having surgery with Dr. Hassell Done for H/H and will get new band at that time.  Wt Readings from Last 3 Encounters:  12/03/14 222 lb (100.699 kg)  10/11/13 209 lb (94.802 kg)  04/27/13 209 lb 12.8 oz (95.165 kg)    Current Medications:  Current Outpatient Prescriptions on File Prior to Visit  Medication Sig Dispense Refill  . Cholecalciferol (VITAMIN D-3 PO) Take 2,000 Units by mouth daily.     Marland Kitchen loratadine (CLARITIN) 10 MG tablet Take 10 mg by mouth daily.    . meloxicam (MOBIC) 7.5 MG tablet TAKE ONE TABLET BY MOUTH ONCE DAILY WITH FOOD 90 tablet 1  . Multiple Vitamin (MULTIVITAMIN) capsule Take 1 capsule by mouth daily.    . Pantoprazole Sodium (PROTONIX PO) Take 20 mg by mouth 2 (two) times daily.     Marland Kitchen venlafaxine XR (EFFEXOR-XR) 75 MG 24 hr capsule Take 1 capsule by mouth  daily with breakfast 90 capsule 0  No current facility-administered medications on file prior to visit.   Health Maintenance:  Immunization History  Administered Date(s) Administered  . Influenza-Unspecified 07/11/2013  . Td 04/28/2006   Tetanus: 2007  Pneumovax:N/A Flu vaccine: 07/2014 at work Zostavax: N/A Pap:2014 normal q 5 years 2019 MGM: 2016 3D neg +FBD- Dr. Redmond Pulling)  Right breast U/S 12/24/2013 NEGATIVE DEXA: 2010 normal- non  smoker, no family history, BMI elevated, no broken bones Colonoscopy: 2009 normal due 2019 (Dr. Collene Mares) , hemoccult at Dr. Dellis Filbert EGD: N/A Myoview stress test 10/2001 AB [place] 2003 DEE 01/2014 Dr. Gaylyn Cheers Dentist: Dr. Carman Ching in [place], Due next mon, has braces for another 4-5 months  Allergies: No Known Allergies Medical History:  Past Medical History  Diagnosis Date  . IBS (irritable bowel syndrome)     Has not been a problem since sx  . GERD (gastroesophageal reflux disease)   . Hypertension   . Allergy   . Asthma   . Arthritis   . H/O dysplastic nevus   . OSA (obstructive sleep apnea)     resolved with Lap Band   Surgical History:  Past Surgical History  Procedure Laterality Date  . Abdominal hysterectomy  2004  . Cholecystectomy  2003  . Breast surgery  1972    benign tumor  . Laparoscopic gastric banding  11/05/09    Dr. Hassell Done   Family History:  Family History  Problem Relation Age of Onset  . Hyperlipidemia Father   . Heart disease Father    Social History:  History   Social History  . Marital Status: Married    Spouse Name: N/A  . Number of Children: N/A  . Years of Education: N/A   Occupational History  . Not on file.   Social History Main Topics  . Smoking status: Former Smoker -- 0.25 packs/day    Types: Cigarettes    Quit date: 08/31/1998  . Smokeless tobacco: Never Used  . Alcohol Use: No  . Drug Use: No  . Sexual Activity: Not on file   Other Topics Concern  . Not on file   Social History Narrative   Review of Systems  Constitutional: Negative.   HENT: Positive for congestion. Negative for ear discharge, ear pain, hearing loss, nosebleeds, sore throat and tinnitus.   Eyes: Negative.   Respiratory: Positive for cough. Negative for hemoptysis, sputum production, shortness of breath, wheezing and stridor.   Cardiovascular: Negative.   Gastrointestinal: Positive for heartburn and diarrhea. Negative for nausea, vomiting, abdominal pain,  constipation, blood in stool and melena.  Genitourinary: Negative.   Musculoskeletal: Positive for back pain (lower back pain with pain into her left leg). Negative for myalgias, joint pain, falls and neck pain.  Skin: Negative.   Neurological: Negative.  Negative for headaches.  Endo/Heme/Allergies: Negative.   Psychiatric/Behavioral: Negative.     Physical Exam: Blood pressure 140/98, pulse 78, temperature 98 F (36.7 C), temperature source Temporal, resp. rate 18, height 5' (1.524 m), weight 222 lb (100.699 kg). Body mass index is 43.36 kg/(m^2). General Appearance: Well nourished, in no apparent distress. Eyes: PERRLA, EOMs, conjunctiva no swelling or erythema, normal fundi and vessels. Sinuses: No Frontal/maxillary tenderness ENT/Mouth: Ext aud canals clear, normal light reflex with TMs without erythema, bulging.  Good dentition. No erythema, swelling, or exudate on post pharynx. Tonsils not swollen or erythematous. Hearing normal.  Neck: Supple, thyroid normal. No bruits Respiratory: Respiratory effort normal, BS equal bilaterally without rales, rhonchi, wheezing or stridor. Cardio: RRR without murmurs, rubs or  gallops. Brisk peripheral pulses without edema.  Chest: symmetric, with normal excursions and percussion. Breasts: defer Abdomen: Soft, +BS, obese, epigastric tenderness, no guarding, rebound, hernias, masses, or organomegaly. .  Lymphatics: Non tender without lymphadenopathy.  Genitourinary: defer Musculoskeletal: Full ROM all peripheral extremities,5/5 strength, and normal gait. Skin: Warm, dry without rashes, lesions, ecchymosis.  Neuro: Cranial nerves intact, reflexes equal bilaterally. Normal muscle tone, no cerebellar symptoms. Sensation intact.  Psych: Awake and oriented X 3, normal affect, Insight and Judgment appropriate.   EKG: WNL no changes.    [name] 10:24 AM

## 2014-12-10 ENCOUNTER — Encounter: Payer: Self-pay | Admitting: Physician Assistant

## 2014-12-17 ENCOUNTER — Other Ambulatory Visit: Payer: Self-pay | Admitting: Physician Assistant

## 2014-12-18 LAB — HGB A1C W/O EAG: HEMOGLOBIN A1C: 5.7 % — AB (ref 4.8–5.6)

## 2014-12-18 LAB — BASIC METABOLIC PANEL
BUN / CREAT RATIO: 26 (ref 11–26)
BUN: 18 mg/dL (ref 8–27)
CALCIUM: 9.6 mg/dL (ref 8.7–10.3)
CO2: 26 mmol/L (ref 18–29)
CREATININE: 0.69 mg/dL (ref 0.57–1.00)
Chloride: 102 mmol/L (ref 97–108)
GFR calc Af Amer: 109 mL/min/{1.73_m2} (ref >59)
GFR, EST NON AFRICAN AMERICAN: 95 mL/min/{1.73_m2} (ref >59)
Glucose: 100 mg/dL — ABNORMAL HIGH (ref 65–99)
POTASSIUM: 4.7 mmol/L (ref 3.5–5.2)
SODIUM: 143 mmol/L (ref 134–144)

## 2014-12-18 LAB — CBC WITH DIFFERENTIAL/PLATELET
Basophils Absolute: 0 10*3/uL (ref 0.0–0.2)
Basos: 1 %
EOS: 4 %
Eosinophils Absolute: 0.2 10*3/uL (ref 0.0–0.4)
HCT: 38.3 % (ref 34.0–46.6)
Hemoglobin: 12.8 g/dL (ref 11.1–15.9)
Immature Grans (Abs): 0 10*3/uL (ref 0.0–0.1)
Immature Granulocytes: 0 %
Lymphocytes Absolute: 1.8 10*3/uL (ref 0.7–3.1)
Lymphs: 35 %
MCH: 30.8 pg (ref 26.6–33.0)
MCHC: 33.4 g/dL (ref 31.5–35.7)
MCV: 92 fL (ref 79–97)
MONOCYTES: 9 %
MONOS ABS: 0.4 10*3/uL (ref 0.1–0.9)
NEUTROS PCT: 51 %
Neutrophils Absolute: 2.6 10*3/uL (ref 1.4–7.0)
Platelets: 256 10*3/uL (ref 150–379)
RBC: 4.15 x10E6/uL (ref 3.77–5.28)
RDW: 14.6 % (ref 12.3–15.4)
WBC: 5.1 10*3/uL (ref 3.4–10.8)

## 2014-12-18 LAB — LIPID PANEL W/O CHOL/HDL RATIO
Cholesterol, Total: 203 mg/dL — ABNORMAL HIGH (ref 100–199)
HDL: 73 mg/dL (ref >39)
LDL Calculated: 82 mg/dL (ref 0–99)
Triglycerides: 239 mg/dL — ABNORMAL HIGH (ref 0–149)
VLDL CHOLESTEROL CAL: 48 mg/dL — AB (ref 5–40)

## 2014-12-18 LAB — MAGNESIUM: MAGNESIUM: 1.7 mg/dL (ref 1.6–2.3)

## 2014-12-18 LAB — INSULIN, RANDOM: INSULIN: 19.2 u[IU]/mL (ref 2.6–24.9)

## 2014-12-18 LAB — HEPATIC FUNCTION PANEL
ALT: 17 IU/L (ref 0–32)
AST: 14 IU/L (ref 0–40)
Albumin: 4.1 g/dL (ref 3.6–4.8)
Alkaline Phosphatase: 98 IU/L (ref 39–117)
Bilirubin Total: 0.3 mg/dL (ref 0.0–1.2)
Bilirubin, Direct: 0.11 mg/dL (ref 0.00–0.40)
Total Protein: 6.3 g/dL (ref 6.0–8.5)

## 2014-12-18 LAB — MICROALBUMIN / CREATININE URINE RATIO
CREATININE, UR: 70.4 mg/dL (ref 15.0–278.0)
MICROALB/CREAT RATIO: 8.7 mg/g creat (ref 0.0–30.0)
Microalbumin, Urine: 6.1 ug/mL (ref 0.0–17.0)

## 2014-12-18 LAB — VITAMIN D 25 HYDROXY (VIT D DEFICIENCY, FRACTURES): Vit D, 25-Hydroxy: 35.6 ng/mL (ref 30.0–100.0)

## 2014-12-18 LAB — TSH: TSH: 3 u[IU]/mL (ref 0.450–4.500)

## 2014-12-27 ENCOUNTER — Ambulatory Visit: Payer: Self-pay | Admitting: Surgery

## 2014-12-27 NOTE — H&P (Signed)
Maureen [name]. Crill 11/15/2014 11:12 AM Location: [facility] Patient #: [id number] DOB: October 21, 1955 Married / Language: English / Race: White Female  History of Present Illness Rodman Key B. Hassell Done MD; 11/15/2014 11:54 AM) CC:   Non functioning band-unable to adjust because of reflux aggravation  The patient had a lap band AP standard performed in March 2011 by Dr. Excell Seltzer and me. She had a 2 suture posterior repair followed by the AP standard with standard plication. She did well up until 2 years ago when she had a virus and vomited a lot and then subsequently had to come in the office and have all the fluid taken out of her band. Since that time she is not been able to get her band adjusted properly and it seems that with any degree of restriction she has a lot of coughing. On an upper GI that looks like she has a recurrent hiatal hernia. She would like very much to keep her band and we discussed approaches and arrived that it forgot dissection and hiatal hernia repair followed by re-plication of the band if possible. She is aware of the risk of having this done and indicated it might not be able to do. At this point I don't think she is ready to consider sleeve gastrectomy or bypass. Her problem is the hiatal hernia with reflux and coughing and that's what we need to try to address at this point.   Allergies (Sonya Bynum, CMA; 11/15/2014 11:13 AM) No Known Drug Allergies02/26/2016  Medication History (Sonya Bynum, CMA; 11/15/2014 11:13 AM) TraMADol HCl (50MG  Tablet, Oral) Active. Venlafaxine HCl ER (75MG  Capsule ER 24HR, Oral) Active. PredniSONE (50MG  Tablet, Oral) Active. Omeprazole (20MG  Capsule DR, Oral) Active. Medications Reconciled  Vitals (Sonya Bynum CMA; 11/15/2014 11:12 AM) 11/15/2014 11:12 AM Weight: 222.2 lb Height: 61in Body Surface Area: 2.08 m Body Mass Index: 41.98 kg/m Temp.: 97.81F(Temporal)  Pulse: 79 (Regular)  BP: 138/78 (Sitting, Left Arm,  Standard)    Physical Exam (Keenan Trefry B. Hassell Done MD; 11/15/2014 11:57 AM) The physical exam findings are as follows: Note:Currently down 23 lbs from preoperative weight.  General Note: HEENT unremarkable Neck supple without masses Chest clear to auscultation Heart SR without murmurs Abdomen port on right side GU not examined Ext FROM with swelling Neuro alert and oriented x 3, motor and sensory function is intact     Assessment & Plan Rodman Key B. Hassell Done MD; 5/63/8756 43:32 AM) COMPLICATION OF GASTRIC BAND PROCEDURE (539.09  K95.09) Impression: Needs repair of symptomatic hiatus hernia and replication of lapband.  I have discussed with her in detail and she understands that she may have to have lapband removed.

## 2015-01-12 ENCOUNTER — Encounter: Payer: Self-pay | Admitting: *Deleted

## 2015-01-14 NOTE — Patient Instructions (Signed)
Maureen [name]  01/14/2015   Your procedure is scheduled on:     01/22/2015    Report to [facility] Main  Entrance and follow signs to               [place] at     [place] AM.  Call this number if you have problems the morning of surgery [phone]   Remember: ONLY 1 PERSON MAY GO WITH YOU TO SHORT STAY TO GET  READY MORNING OF [place].  Do not eat food or drink liquids :After Midnight.     Take these medicines the morning of surgery with A SIP OF WATER: none                                You may not have any metal on your body including hair pins and              piercings  Do not wear jewelry, make-up, lotions, powders or perfumes, deodorant             Do not wear nail polish.  Do not shave  48 hours prior to surgery.              Do not bring valuables to the hospital. [place].  Contacts, dentures or bridgework may not be worn into surgery.  Leave suitcase in the car. After surgery it may be brought to your room.        Special Instructions: coughing and deep breathing exercises, leg exercises               Please read over the following fact sheets you were given: _____________________________________________________________________             [facility] - Preparing for Surgery Before surgery, you can play an important role.  Because skin is not sterile, your skin needs to be as free of germs as possible.  You can reduce the number of germs on your skin by washing with CHG (chlorahexidine gluconate) soap before surgery.  CHG is an antiseptic cleaner which kills germs and bonds with the skin to continue killing germs even after washing. Please DO NOT use if you have an allergy to CHG or antibacterial soaps.  If your skin becomes reddened/irritated stop using the CHG and inform your nurse when you arrive at Short Stay. Do not shave (including legs and underarms) for at least 48 hours  prior to the first CHG shower.  You may shave your face/neck. Please follow these instructions carefully:  1.  Shower with CHG Soap the night before surgery and the  morning of Surgery.  2.  If you choose to wash your hair, wash your hair first as usual with your  normal  shampoo.  3.  After you shampoo, rinse your hair and body thoroughly to remove the  shampoo.                           4.  Use CHG as you would any other liquid soap.  You can apply chg directly  to the skin and wash  Gently with a scrungie or clean washcloth.  5.  Apply the CHG Soap to your body ONLY FROM THE NECK DOWN.   Do not use on face/ open                           Wound or open sores. Avoid contact with eyes, ears mouth and genitals (private parts).                       Wash face,  Genitals (private parts) with your normal soap.             6.  Wash thoroughly, paying special attention to the area where your surgery  will be performed.  7.  Thoroughly rinse your body with warm water from the neck down.  8.  DO NOT shower/wash with your normal soap after using and rinsing off  the CHG Soap.                9.  Pat yourself dry with a clean towel.            10.  Wear clean pajamas.            11.  Place clean sheets on your bed the night of your first shower and do not  sleep with pets. Day of Surgery : Do not apply any lotions/deodorants the morning of surgery.  Please wear clean clothes to the hospital/surgery center.  FAILURE TO FOLLOW THESE INSTRUCTIONS MAY RESULT IN THE CANCELLATION OF YOUR SURGERY PATIENT SIGNATURE_________________________________  NURSE SIGNATURE__________________________________  ________________________________________________________________________

## 2015-01-16 ENCOUNTER — Inpatient Hospital Stay (HOSPITAL_COMMUNITY)
Admission: RE | Admit: 2015-01-16 | Discharge: 2015-01-16 | Disposition: A | Discharge status: Home / Self Care | Payer: 59 | Source: Ambulatory Visit

## 2015-01-16 ENCOUNTER — Encounter (HOSPITAL_COMMUNITY): Payer: Self-pay

## 2015-01-16 ENCOUNTER — Encounter (HOSPITAL_COMMUNITY)
Admission: RE | Admit: 2015-01-16 | Discharge: 2015-01-16 | Disposition: A | Discharge status: Home / Self Care | Payer: 59 | Source: Ambulatory Visit | Attending: Surgery | Admitting: Surgery

## 2015-01-16 DIAGNOSIS — Z01818 Encounter for other preprocedural examination: Secondary | ICD-10-CM | POA: Insufficient documentation

## 2015-01-16 HISTORY — DX: Anesthesia of skin: R20.0

## 2015-01-16 NOTE — Patient Instructions (Addendum)
YOUR PROCEDURE IS SCHEDULED ON :  01/22/15  REPORT TO [facility] MAIN ENTRANCE FOLLOW SIGNS TO SHORT STAY CENTER AT : 5:15 AM  CALL THIS NUMBER IF YOU HAVE PROBLEMS THE MORNING OF SURGERY [phone]  REMEMBER:  DO NOT EAT FOOD OR DRINK LIQUIDS AFTER MIDNIGHT  TAKE THESE MEDICINES THE MORNING OF SURGERY:  NONE  YOU MAY NOT HAVE ANY METAL ON YOUR BODY INCLUDING HAIR PINS AND PIERCING'S. DO NOT WEAR JEWELRY, MAKEUP, LOTIONS, POWDERS OR PERFUMES. DO NOT WEAR NAIL POLISH. DO NOT SHAVE 48 HRS PRIOR TO SURGERY. MEN MAY SHAVE FACE AND NECK.  DO NOT [place]. [facility] IS NOT RESPONSIBLE FOR VALUABLES.  CONTACTS, DENTURES OR PARTIALS MAY NOT BE WORN TO SURGERY. LEAVE SUITCASE IN CAR. CAN BE BROUGHT TO ROOM AFTER SURGERY.  PATIENTS DISCHARGED THE DAY OF SURGERY WILL NOT BE ALLOWED TO DRIVE HOME.  PLEASE READ OVER THE FOLLOWING INSTRUCTION SHEETS _________________________________________________________________________________                                          [facility] - PREPARING FOR SURGERY  Before surgery, you can play an important role.  Because skin is not sterile, your skin needs to be as free of germs as possible.  You can reduce the number of germs on your skin by washing with CHG (chlorahexidine gluconate) soap before surgery.  CHG is an antiseptic cleaner which kills germs and bonds with the skin to continue killing germs even after washing. Please DO NOT use if you have an allergy to CHG or antibacterial soaps.  If your skin becomes reddened/irritated stop using the CHG and inform your nurse when you arrive at Short Stay. Do not shave (including legs and underarms) for at least 48 hours prior to the first CHG shower.  You may shave your face. Please follow these instructions carefully:   1.  Shower with CHG Soap the night before surgery and the  morning of Surgery.   2.  If you choose to wash your hair, wash your hair first as  usual with your  normal  Shampoo.   3.  After you shampoo, rinse your hair and body thoroughly to remove the  shampoo.                                         4.  Use CHG as you would any other liquid soap.  You can apply chg directly  to the skin and wash . Gently wash with scrungie or clean wascloth    5.  Apply the CHG Soap to your body ONLY FROM THE NECK DOWN.   Do not use on open                           Wound or open sores. Avoid contact with eyes, ears mouth and genitals (private parts).                        Genitals (private parts) with your normal soap.              6.  Wash thoroughly, paying special attention to the area where your surgery  will be performed.  7.  Thoroughly rinse your body with warm water from the neck down.   8.  DO NOT shower/wash with your normal soap after using and rinsing off  the CHG Soap .                9.  Pat yourself dry with a clean towel.             10.  Wear clean night clothes to bed after shower             11.  Place clean sheets on your bed the night of your first shower and do not  sleep with pets.  Day of Surgery : Do not apply any lotions/deodorants the morning of surgery.  Please wear clean clothes to the hospital/surgery center.  FAILURE TO FOLLOW THESE INSTRUCTIONS MAY RESULT IN THE CANCELLATION OF YOUR SURGERY    PATIENT SIGNATURE_________________________________  ______________________________________________________________________

## 2015-01-21 NOTE — Anesthesia Preprocedure Evaluation (Addendum)
Anesthesia Evaluation  Patient identified by MRN, date of birth, ID band Patient awake    Reviewed: Allergy & Precautions, NPO status , Patient's Chart, lab work & pertinent test results, reviewed documented beta blocker date and time   Airway Mallampati: II   Neck ROM: Full    Dental  (+) Dental Advisory Given, Teeth Intact   Pulmonary asthma , former smoker (quit 2000),  breath sounds clear to auscultation        Cardiovascular hypertension, Pt. on medications Rhythm:Regular     Neuro/Psych    GI/Hepatic GERD-  Medicated,SP Lap Band 2011   Endo/Other    Renal/GU      Musculoskeletal   Abdominal (+) + obese,   Peds  Hematology 12/36 11/2014   Anesthesia Other Findings   Reproductive/Obstetrics                            Anesthesia Physical Anesthesia Plan  ASA: III  Anesthesia Plan: General   Post-op Pain Management:    Induction: Intravenous  Airway Management Planned: Oral ETT  Additional Equipment:   Intra-op Plan:   Post-operative Plan: Extubation in OR  Informed Consent: I have reviewed the patients History and Physical, chart, labs and discussed the procedure including the risks, benefits and alternatives for the proposed anesthesia with the patient or authorized representative who has indicated his/her understanding and acceptance.     Plan Discussed with:   Anesthesia Plan Comments: (18 g or larger IV, multimodal pain RX)        Anesthesia Quick Evaluation

## 2015-01-22 ENCOUNTER — Inpatient Hospital Stay (HOSPITAL_COMMUNITY): Payer: 59 | Admitting: Anesthesiology

## 2015-01-22 ENCOUNTER — Encounter (HOSPITAL_COMMUNITY): Payer: Self-pay | Admitting: *Deleted

## 2015-01-22 ENCOUNTER — Encounter (HOSPITAL_COMMUNITY)
Admission: RE | Disposition: A | Discharge status: Home / Self Care | Payer: Self-pay | Source: Ambulatory Visit | Attending: Surgery

## 2015-01-22 ENCOUNTER — Observation Stay (HOSPITAL_COMMUNITY)
Admission: RE | Admit: 2015-01-22 | Discharge: 2015-01-23 | Disposition: A | Discharge status: Home / Self Care | Payer: 59 | Source: Ambulatory Visit | Attending: Surgery | Admitting: Surgery

## 2015-01-22 DIAGNOSIS — Y838 Other surgical procedures as the cause of abnormal reaction of the patient, or of later complication, without mention of misadventure at the time of the procedure: Secondary | ICD-10-CM | POA: Diagnosis not present

## 2015-01-22 DIAGNOSIS — J45909 Unspecified asthma, uncomplicated: Secondary | ICD-10-CM | POA: Insufficient documentation

## 2015-01-22 DIAGNOSIS — Z7952 Long term (current) use of systemic steroids: Secondary | ICD-10-CM | POA: Insufficient documentation

## 2015-01-22 DIAGNOSIS — Z79899 Other long term (current) drug therapy: Secondary | ICD-10-CM | POA: Insufficient documentation

## 2015-01-22 DIAGNOSIS — Y92234 Operating room of hospital as the place of occurrence of the external cause: Secondary | ICD-10-CM | POA: Insufficient documentation

## 2015-01-22 DIAGNOSIS — K9509 Other complications of gastric band procedure: Secondary | ICD-10-CM

## 2015-01-22 DIAGNOSIS — T85518A Breakdown (mechanical) of other gastrointestinal prosthetic devices, implants and grafts, initial encounter: Principal | ICD-10-CM | POA: Insufficient documentation

## 2015-01-22 DIAGNOSIS — K660 Peritoneal adhesions (postprocedural) (postinfection): Secondary | ICD-10-CM | POA: Diagnosis not present

## 2015-01-22 DIAGNOSIS — K219 Gastro-esophageal reflux disease without esophagitis: Secondary | ICD-10-CM | POA: Insufficient documentation

## 2015-01-22 DIAGNOSIS — I10 Essential (primary) hypertension: Secondary | ICD-10-CM | POA: Insufficient documentation

## 2015-01-22 DIAGNOSIS — Z6841 Body Mass Index (BMI) 40.0 and over, adult: Secondary | ICD-10-CM | POA: Diagnosis not present

## 2015-01-22 DIAGNOSIS — Z87891 Personal history of nicotine dependence: Secondary | ICD-10-CM | POA: Diagnosis not present

## 2015-01-22 HISTORY — PX: LAPAROSCOPIC GASTRIC BANDING WITH HIATAL HERNIA REPAIR: SHX6351

## 2015-01-22 LAB — CBC
HEMATOCRIT: 41.5 % (ref 36.0–46.0)
Hemoglobin: 13.6 g/dL (ref 12.0–15.0)
MCH: 31.4 pg (ref 26.0–34.0)
MCHC: 32.8 g/dL (ref 30.0–36.0)
MCV: 95.8 fL (ref 78.0–100.0)
Platelets: 233 10*3/uL (ref 150–400)
RBC: 4.33 MIL/uL (ref 3.87–5.11)
RDW: 14 % (ref 11.5–15.5)
WBC: 9.8 10*3/uL (ref 4.0–10.5)

## 2015-01-22 LAB — CREATININE, SERUM
CREATININE: 0.69 mg/dL (ref 0.44–1.00)
GFR calc Af Amer: >60 mL/min (ref >60)
GFR calc non Af Amer: >60 mL/min (ref >60)

## 2015-01-22 SURGERY — GASTRIC BANDING, LAPAROSCOPIC, WITH HIATAL HERNIA REPAIR
Anesthesia: General | Site: Abdomen

## 2015-01-22 MED ORDER — LIDOCAINE HCL (CARDIAC) 20 MG/ML IV SOLN
INTRAVENOUS | Status: AC
  Filled 2015-01-22: qty 5

## 2015-01-22 MED ORDER — PANTOPRAZOLE SODIUM 40 MG IV SOLR
40.0000 mg | Freq: Every day | INTRAVENOUS | Status: DC
  Administered 2015-01-22: 40 mg via INTRAVENOUS
  Filled 2015-01-22 (×2): qty 40

## 2015-01-22 MED ORDER — CISATRACURIUM BESYLATE (PF) 10 MG/5ML IV SOLN
INTRAVENOUS | Status: DC | PRN
  Administered 2015-01-22: 8 mg via INTRAVENOUS
  Administered 2015-01-22: 2 mg via INTRAVENOUS
  Administered 2015-01-22: 4 mg via INTRAVENOUS

## 2015-01-22 MED ORDER — EPHEDRINE SULFATE 50 MG/ML IJ SOLN
INTRAMUSCULAR | Status: DC | PRN
  Administered 2015-01-22 (×3): 5 mg via INTRAVENOUS

## 2015-01-22 MED ORDER — ACETAMINOPHEN 10 MG/ML IV SOLN
1000.0000 mg | Freq: Once | INTRAVENOUS | Status: AC
  Administered 2015-01-22: 1000 mg via INTRAVENOUS
  Filled 2015-01-22: qty 100

## 2015-01-22 MED ORDER — LACTATED RINGERS IR SOLN
Status: DC | PRN
  Administered 2015-01-22: 1

## 2015-01-22 MED ORDER — UNJURY CHOCOLATE CLASSIC POWDER
2.0000 [oz_av] | Freq: Four times a day (QID) | ORAL | Status: DC
  Administered 2015-01-23: 2 [oz_av] via ORAL

## 2015-01-22 MED ORDER — FENTANYL CITRATE (PF) 100 MCG/2ML IJ SOLN
INTRAMUSCULAR | Status: DC | PRN
  Administered 2015-01-22 (×4): 50 ug via INTRAVENOUS
  Administered 2015-01-22: 100 ug via INTRAVENOUS
  Administered 2015-01-22: 50 ug via INTRAVENOUS

## 2015-01-22 MED ORDER — PROMETHAZINE HCL 25 MG/ML IJ SOLN: 6.2500 mg | INTRAMUSCULAR | Status: DC | PRN

## 2015-01-22 MED ORDER — 0.9 % SODIUM CHLORIDE (POUR BTL) OPTIME
TOPICAL | Status: DC | PRN
  Administered 2015-01-22: 1000 mL

## 2015-01-22 MED ORDER — ONDANSETRON HCL 4 MG/2ML IJ SOLN
INTRAMUSCULAR | Status: AC
  Filled 2015-01-22: qty 2

## 2015-01-22 MED ORDER — SODIUM CHLORIDE 0.9 % IJ SOLN
INTRAMUSCULAR | Status: AC
  Filled 2015-01-22: qty 10

## 2015-01-22 MED ORDER — NEOSTIGMINE METHYLSULFATE 10 MG/10ML IV SOLN
INTRAVENOUS | Status: AC
  Filled 2015-01-22: qty 1

## 2015-01-22 MED ORDER — LACTATED RINGERS IV SOLN
INTRAVENOUS | Status: DC | PRN
  Administered 2015-01-22 (×2): via INTRAVENOUS

## 2015-01-22 MED ORDER — FENTANYL CITRATE (PF) 250 MCG/5ML IJ SOLN
INTRAMUSCULAR | Status: AC
  Filled 2015-01-22: qty 5

## 2015-01-22 MED ORDER — ACETAMINOPHEN 160 MG/5ML PO SOLN: 650.0000 mg | ORAL | Status: DC | PRN

## 2015-01-22 MED ORDER — CEFOXITIN SODIUM 2 G IV SOLR
INTRAVENOUS | Status: AC
  Filled 2015-01-22: qty 2

## 2015-01-22 MED ORDER — MORPHINE SULFATE 2 MG/ML IJ SOLN: 2.0000 mg | INTRAMUSCULAR | Status: DC | PRN

## 2015-01-22 MED ORDER — NEOSTIGMINE METHYLSULFATE 10 MG/10ML IV SOLN
INTRAVENOUS | Status: DC | PRN
  Administered 2015-01-22: 5 mg via INTRAVENOUS

## 2015-01-22 MED ORDER — MIDAZOLAM HCL 2 MG/2ML IJ SOLN
INTRAMUSCULAR | Status: AC
  Filled 2015-01-22: qty 2

## 2015-01-22 MED ORDER — DEXAMETHASONE SODIUM PHOSPHATE 10 MG/ML IJ SOLN
INTRAMUSCULAR | Status: DC | PRN
  Administered 2015-01-22: 10 mg via INTRAVENOUS

## 2015-01-22 MED ORDER — MEPERIDINE HCL 50 MG/ML IJ SOLN: 6.2500 mg | INTRAMUSCULAR | Status: DC | PRN

## 2015-01-22 MED ORDER — HEPARIN SODIUM (PORCINE) 5000 UNIT/ML IJ SOLN
5000.0000 [IU] | INTRAMUSCULAR | Status: AC
  Administered 2015-01-22: 5000 [IU] via SUBCUTANEOUS
  Filled 2015-01-22: qty 1

## 2015-01-22 MED ORDER — DEXTROSE 5 % IV SOLN
INTRAVENOUS | Status: AC
  Filled 2015-01-22: qty 2

## 2015-01-22 MED ORDER — BUPIVACAINE LIPOSOME 1.3 % IJ SUSP
20.0000 mL | Freq: Once | INTRAMUSCULAR | Status: AC
  Administered 2015-01-22: 20 mL
  Filled 2015-01-22: qty 20

## 2015-01-22 MED ORDER — OXYCODONE HCL 5 MG/5ML PO SOLN: 5.0000 mg | ORAL | Status: DC | PRN

## 2015-01-22 MED ORDER — SUCCINYLCHOLINE CHLORIDE 20 MG/ML IJ SOLN
INTRAMUSCULAR | Status: DC | PRN
  Administered 2015-01-22: 140 mg via INTRAVENOUS

## 2015-01-22 MED ORDER — GLYCOPYRROLATE 0.2 MG/ML IJ SOLN
INTRAMUSCULAR | Status: AC
  Filled 2015-01-22: qty 2

## 2015-01-22 MED ORDER — SODIUM CHLORIDE 0.9 % IJ SOLN
INTRAMUSCULAR | Status: DC | PRN
Start: 2015-01-22 — End: 2015-01-22
  Administered 2015-01-22: 10 mL via INTRAVENOUS

## 2015-01-22 MED ORDER — CHLORHEXIDINE GLUCONATE CLOTH 2 % EX PADS: 6.0000 | MEDICATED_PAD | Freq: Once | CUTANEOUS | Status: DC

## 2015-01-22 MED ORDER — ONDANSETRON HCL 4 MG/2ML IJ SOLN
INTRAMUSCULAR | Status: DC | PRN
  Administered 2015-01-22 (×2): 2 mg via INTRAVENOUS

## 2015-01-22 MED ORDER — MIDAZOLAM HCL 5 MG/5ML IJ SOLN
INTRAMUSCULAR | Status: DC | PRN
  Administered 2015-01-22 (×2): 1 mg via INTRAVENOUS

## 2015-01-22 MED ORDER — PROPOFOL 10 MG/ML IV BOLUS
INTRAVENOUS | Status: DC | PRN
  Administered 2015-01-22: 200 mg via INTRAVENOUS

## 2015-01-22 MED ORDER — CISATRACURIUM BESYLATE (PF) 10 MG/5ML IV SOLN: INTRAVENOUS | Status: DC | PRN

## 2015-01-22 MED ORDER — FENTANYL CITRATE (PF) 100 MCG/2ML IJ SOLN: 25.0000 ug | INTRAMUSCULAR | Status: DC | PRN

## 2015-01-22 MED ORDER — PROPOFOL 10 MG/ML IV BOLUS
INTRAVENOUS | Status: AC
  Filled 2015-01-22: qty 20

## 2015-01-22 MED ORDER — DEXTROSE 5 % IV SOLN
2.0000 g | INTRAVENOUS | Status: AC
  Administered 2015-01-22 (×2): 2 g via INTRAVENOUS

## 2015-01-22 MED ORDER — CISATRACURIUM BESYLATE 20 MG/10ML IV SOLN
INTRAVENOUS | Status: AC
  Filled 2015-01-22: qty 10

## 2015-01-22 MED ORDER — HEPARIN SODIUM (PORCINE) 5000 UNIT/ML IJ SOLN
5000.0000 [IU] | Freq: Three times a day (TID) | INTRAMUSCULAR | Status: DC
  Administered 2015-01-22 – 2015-01-23 (×2): 5000 [IU] via SUBCUTANEOUS
  Filled 2015-01-22 (×5): qty 1

## 2015-01-22 MED ORDER — ACETAMINOPHEN 160 MG/5ML PO SOLN: 325.0000 mg | ORAL | Status: DC | PRN

## 2015-01-22 MED ORDER — UNJURY VANILLA POWDER: 2.0000 [oz_av] | Freq: Four times a day (QID) | ORAL | Status: DC

## 2015-01-22 MED ORDER — DEXAMETHASONE SODIUM PHOSPHATE 10 MG/ML IJ SOLN
INTRAMUSCULAR | Status: AC
  Filled 2015-01-22: qty 1

## 2015-01-22 MED ORDER — PANTOPRAZOLE SODIUM 20 MG PO TBEC: 20.0000 mg | DELAYED_RELEASE_TABLET | Freq: Every evening | ORAL | Status: DC

## 2015-01-22 MED ORDER — LIDOCAINE HCL (CARDIAC) 20 MG/ML IV SOLN
INTRAVENOUS | Status: DC | PRN
  Administered 2015-01-22: 75 mg via INTRAVENOUS

## 2015-01-22 MED ORDER — EPHEDRINE SULFATE 50 MG/ML IJ SOLN
INTRAMUSCULAR | Status: AC
  Filled 2015-01-22: qty 1

## 2015-01-22 MED ORDER — ONDANSETRON HCL 4 MG/2ML IJ SOLN: 4.0000 mg | INTRAMUSCULAR | Status: DC | PRN

## 2015-01-22 MED ORDER — UNJURY CHICKEN SOUP POWDER: 2.0000 [oz_av] | Freq: Four times a day (QID) | ORAL | Status: DC

## 2015-01-22 SURGICAL SUPPLY — 62 items
APL SKNCLS STERI-STRIP NONHPOA (GAUZE/BANDAGES/DRESSINGS)
APPLIER CLIP 5 13 M/L LIGAMAX5 (MISCELLANEOUS) ×3
APR CLP MED LRG 5 ANG JAW (MISCELLANEOUS) ×1
BENZOIN TINCTURE PRP APPL 2/3 (GAUZE/BANDAGES/DRESSINGS) IMPLANT
BLADE HEX COATED 2.75 (ELECTRODE) ×3 IMPLANT
BLADE SURG 15 STRL LF DISP TIS (BLADE) ×1 IMPLANT
BLADE SURG 15 STRL SS (BLADE) ×3
CLIP APPLIE 5 13 M/L LIGAMAX5 (MISCELLANEOUS) IMPLANT
CLOSURE WOUND 1/2 X4 (GAUZE/BANDAGES/DRESSINGS)
COVER SURGICAL LIGHT HANDLE (MISCELLANEOUS) ×1 IMPLANT
DECANTER SPIKE VIAL GLASS SM (MISCELLANEOUS) ×2 IMPLANT
DEVICE SUT QUICK LOAD TK 5 (STAPLE) ×3 IMPLANT
DEVICE SUT TI-KNOT TK 5X26 (MISCELLANEOUS) ×2 IMPLANT
DEVICE SUTURE ENDOST 10MM (ENDOMECHANICALS) IMPLANT
DEVICE TI KNOT TK5 (MISCELLANEOUS) ×1
DISSECTOR BLUNT TIP ENDO 5MM (MISCELLANEOUS) ×2 IMPLANT
DRAPE CAMERA CLOSED 9X96 (DRAPES) ×3 IMPLANT
ELECT REM PT RETURN 9FT ADLT (ELECTROSURGICAL) ×3
ELECTRODE REM PT RTRN 9FT ADLT (ELECTROSURGICAL) ×1 IMPLANT
GAUZE SPONGE 4X4 12PLY STRL (GAUZE/BANDAGES/DRESSINGS) ×3 IMPLANT
GLOVE BIOGEL M 8.0 STRL (GLOVE) ×7 IMPLANT
GLOVE BIOGEL PI IND STRL 7.0 (GLOVE) ×1 IMPLANT
GLOVE BIOGEL PI INDICATOR 7.0 (GLOVE) ×12
GOWN SPEC L4 XLG W/TWL (GOWN DISPOSABLE) ×1 IMPLANT
GOWN STRL REUS W/TWL LRG LVL3 (GOWN DISPOSABLE) ×3 IMPLANT
GOWN STRL REUS W/TWL XL LVL3 (GOWN DISPOSABLE) ×15 IMPLANT
HOVERMATT SINGLE USE (MISCELLANEOUS) ×3 IMPLANT
KIT BASIN OR (CUSTOM PROCEDURE TRAY) ×3 IMPLANT
LIQUID BAND (GAUZE/BANDAGES/DRESSINGS) ×4 IMPLANT
NDL SPNL 22GX3.5 QUINCKE BK (NEEDLE) ×1 IMPLANT
NEEDLE SPNL 22GX3.5 QUINCKE BK (NEEDLE) ×3 IMPLANT
NS IRRIG 1000ML POUR BTL (IV SOLUTION) ×3 IMPLANT
PACK UNIVERSAL I (CUSTOM PROCEDURE TRAY) ×3 IMPLANT
PENCIL BUTTON HOLSTER BLD 10FT (ELECTRODE) ×1 IMPLANT
QUICK LOAD TK 5 (STAPLE) ×3
SET IRRIG TUBING LAPAROSCOPIC (IRRIGATION / IRRIGATOR) ×2 IMPLANT
SHEARS CURVED HARMONIC AC 45CM (MISCELLANEOUS) ×2 IMPLANT
SHEARS HARMONIC ACE PLUS 36CM (ENDOMECHANICALS) IMPLANT
SLEEVE ADV FIXATION 5X100MM (TROCAR) ×2 IMPLANT
SLEEVE XCEL OPT CAN 5 100 (ENDOMECHANICALS) ×9 IMPLANT
SOLUTION ANTI FOG 6CC (MISCELLANEOUS) ×3 IMPLANT
SPONGE LAP 18X18 X RAY DECT (DISPOSABLE) ×3 IMPLANT
STAPLER VISISTAT 35W (STAPLE) ×3 IMPLANT
STRIP CLOSURE SKIN 1/2X4 (GAUZE/BANDAGES/DRESSINGS) IMPLANT
SUT ETHIBOND 2 0 SH (SUTURE) ×9
SUT ETHIBOND 2 0 SH 36X2 (SUTURE) IMPLANT
SUT SILK 0 (SUTURE)
SUT SILK 0 30XBRD TIE 6 (SUTURE) IMPLANT
SUT SURGIDAC NAB ES-9 0 48 120 (SUTURE) IMPLANT
SUT VIC AB 2-0 SH 27 (SUTURE)
SUT VIC AB 2-0 SH 27X BRD (SUTURE) IMPLANT
SUT VIC AB 4-0 SH 18 (SUTURE) ×3 IMPLANT
SYR 20CC LL (SYRINGE) ×3 IMPLANT
SYR 30ML LL (SYRINGE) ×3 IMPLANT
TOWEL OR 17X26 10 PK STRL BLUE (TOWEL DISPOSABLE) ×6 IMPLANT
TROCAR ADV FIXATION 11X100MM (TROCAR) ×2 IMPLANT
TROCAR BLADELESS 15MM (ENDOMECHANICALS) ×3 IMPLANT
TROCAR BLADELESS OPT 5 100 (ENDOMECHANICALS) ×3 IMPLANT
TROCAR XCEL NON-BLD 11X100MML (ENDOMECHANICALS) ×3 IMPLANT
TROCAR XCEL UNIV SLVE 11M 100M (ENDOMECHANICALS) IMPLANT
TUBE CALIBRATION LAPBAND (TUBING) ×3 IMPLANT
TUBING INSUFFLATION 10FT LAP (TUBING) ×3 IMPLANT

## 2015-01-22 NOTE — Brief Op Note (Signed)
01/22/2015  9:59 AM  PATIENT:  Maureen [name]  59 y.o. female  PRE-OPERATIVE DIAGNOSIS:  MORBID OBESITY  POST-OPERATIVE DIAGNOSIS:  MORBID OBESITY  PROCEDURE:  Procedure(s): LAPAROSCOPIC TAKEDOWN AND REVISION OF GASTRIC BAND REVISION WITH UPPER ENDOSCOPY (N/A)  SURGEON:  Surgeon(s) and Role:    * Johnathan Hausen, MD - Primary    * Excell Seltzer, MD - Assisting  PHYSICIAN ASSISTANT:   ASSISTANTS: Adonis Housekeeper, MD, FACS   ANESTHESIA:   general  EBL:  Total I/O In: 1550 [I.V.:1550] Out: 230 [Urine:180; Blood:50]  BLOOD ADMINISTERED:none  DRAINS: none   LOCAL MEDICATIONS USED:  BUPIVICAINE   SPECIMEN:  No Specimen  DISPOSITION OF SPECIMEN:  N/A  COUNTS:  YES  TOURNIQUET:  * No tourniquets in log *  DICTATION: .Other Dictation: Dictation Number [id number]  PLAN OF CARE: Admit for overnight observation  PATIENT DISPOSITION:  PACU - hemodynamically stable.   Delay start of Pharmacological VTE agent (>24hrs) due to surgical blood loss or risk of bleeding: no

## 2015-01-22 NOTE — Anesthesia Postprocedure Evaluation (Signed)
  Anesthesia Post-op Note  Patient: Maureen [name]  Procedure(s) Performed: Procedure(s): LAPAROSCOPIC TAKEDOWN AND REVISION OF GASTRIC BAND REVISION WITH UPPER ENDOSCOPY (N/A)  Patient Location: PACU  Anesthesia Type:General  Level of Consciousness: awake and alert   Airway and Oxygen Therapy: Patient Spontanous Breathing  Post-op Pain: mild  Post-op Assessment: Post-op Vital signs reviewed, Patient's Cardiovascular Status Stable, Respiratory Function Stable, Patent Airway and No signs of Nausea or vomiting  Post-op Vital Signs: Reviewed and stable  Last Vitals:  Filed Vitals:   01/22/15 1030  BP: 163/81  Pulse: 61  Temp:   Resp: 12    Complications: No apparent anesthesia complications

## 2015-01-22 NOTE — H&P (View-Only) (Signed)
Maureen [name]. Kath 11/15/2014 11:12 AM Location: [facility] Patient #: [id number] DOB: 25-Nov-1955 Married / Language: English / Race: White Female  History of Present Illness Rodman Key B. Hassell Done MD; 11/15/2014 11:54 AM) CC:   Non functioning band-unable to adjust because of reflux aggravation  The patient had a lap band AP standard performed in March 2011 by Dr. Excell Seltzer and me. She had a 2 suture posterior repair followed by the AP standard with standard plication. She did well up until 2 years ago when she had a virus and vomited a lot and then subsequently had to come in the office and have all the fluid taken out of her band. Since that time she is not been able to get her band adjusted properly and it seems that with any degree of restriction she has a lot of coughing. On an upper GI that looks like she has a recurrent hiatal hernia. She would like very much to keep her band and we discussed approaches and arrived that it forgot dissection and hiatal hernia repair followed by re-plication of the band if possible. She is aware of the risk of having this done and indicated it might not be able to do. At this point I don't think she is ready to consider sleeve gastrectomy or bypass. Her problem is the hiatal hernia with reflux and coughing and that's what we need to try to address at this point.   Allergies (Maureen [name], CMA; 11/15/2014 11:13 AM) No Known Drug Allergies02/26/2016  Medication History (Maureen [name], CMA; 11/15/2014 11:13 AM) TraMADol HCl (50MG  Tablet, Oral) Active. Venlafaxine HCl ER (75MG  Capsule ER 24HR, Oral) Active. PredniSONE (50MG  Tablet, Oral) Active. Omeprazole (20MG  Capsule DR, Oral) Active. Medications Reconciled  Vitals (Maureen [name] CMA; 11/15/2014 11:12 AM) 11/15/2014 11:12 AM Weight: 222.2 lb Height: 61in Body Surface Area: 2.08 m Body Mass Index: 41.98 kg/m Temp.: 97.27F(Temporal)  Pulse: 79 (Regular)  BP: 138/78 (Sitting, Left Arm,  Standard)    Physical Exam (Mako Pelfrey B. Hassell Done MD; 11/15/2014 11:57 AM) The physical exam findings are as follows: Note:Currently down 23 lbs from preoperative weight.  General Note: HEENT unremarkable Neck supple without masses Chest clear to auscultation Heart SR without murmurs Abdomen port on right side GU not examined Ext FROM with swelling Neuro alert and oriented x 3, motor and sensory function is intact     Assessment & Plan Rodman Key B. Hassell Done MD; 3/38/2505 39:76 AM) COMPLICATION OF GASTRIC BAND PROCEDURE (539.09  K95.09) Impression: Needs repair of symptomatic hiatus hernia and replication of lapband.  I have discussed with her in detail and she understands that she may have to have lapband removed.

## 2015-01-22 NOTE — Interval H&P Note (Signed)
History and Physical Interval Note:  01/22/2015 7:10 AM  Maureen [name]  has presented today for surgery, with the diagnosis of MORBID OBESITY  The various methods of treatment have been discussed with the patient and family. After consideration of risks, benefits and other options for treatment, the patient has consented to  Procedure(s): [place] (N/A) as a surgical intervention .  [name] history has been reviewed, patient examined, no change in status, stable for surgery.  I have reviewed the patient's chart and labs.  Questions were answered to the patient's satisfaction.     Leeman Johnsey B

## 2015-01-22 NOTE — Transfer of Care (Signed)
Immediate Anesthesia Transfer of Care Note  Patient: Maureen [name]  Procedure(s) Performed: Procedure(s): LAPAROSCOPIC TAKEDOWN AND REVISION OF GASTRIC BAND REVISION WITH UPPER ENDOSCOPY (N/A)  Patient Location: PACU  Anesthesia Type:General  Level of Consciousness: awake, oriented, patient cooperative, lethargic and responds to stimulation  Airway & Oxygen Therapy: Patient Spontanous Breathing and Patient connected to face mask oxygen  Post-op Assessment: Report given to RN, Post -op Vital signs reviewed and stable and Patient moving all extremities  Post vital signs: Reviewed and stable  Last Vitals:  Filed Vitals:   01/22/15 0517  BP: 158/93  Pulse: 87  Temp: 36.6 C  Resp: 18    Complications: No apparent anesthesia complications

## 2015-01-22 NOTE — Anesthesia Procedure Notes (Signed)
Procedure Name: Intubation Date/Time: 01/22/2015 7:20 AM Performed by: Ofilia Neas Pre-anesthesia Checklist: Patient identified, Timeout performed, Emergency Drugs available, Suction available and Patient being monitored Patient Re-evaluated:Patient Re-evaluated prior to inductionOxygen Delivery Method: Circle system utilized Preoxygenation: Pre-oxygenation with 100% oxygen Intubation Type: IV induction and Cricoid Pressure applied Laryngoscope Size: Mac and 4 Grade View: Grade II Tube type: Oral Tube size: 7.5 mm Number of attempts: 1 Airway Equipment and Method: Stylet Placement Confirmation: ETT inserted through vocal cords under direct vision,  positive ETCO2 and breath sounds checked- equal and bilateral Secured at: 21 cm Tube secured with: Tape Dental Injury: Teeth and Oropharynx as per pre-operative assessment

## 2015-01-23 ENCOUNTER — Encounter (HOSPITAL_COMMUNITY): Payer: Self-pay

## 2015-01-23 ENCOUNTER — Observation Stay (HOSPITAL_COMMUNITY): Payer: 59

## 2015-01-23 ENCOUNTER — Encounter (HOSPITAL_COMMUNITY): Payer: Self-pay | Admitting: Surgery

## 2015-01-23 DIAGNOSIS — T85518A Breakdown (mechanical) of other gastrointestinal prosthetic devices, implants and grafts, initial encounter: Secondary | ICD-10-CM | POA: Diagnosis not present

## 2015-01-23 LAB — CBC WITH DIFFERENTIAL/PLATELET
Basophils Absolute: 0 10*3/uL (ref 0.0–0.1)
Basophils Relative: 0 % (ref 0–1)
EOS ABS: 0 10*3/uL (ref 0.0–0.7)
Eosinophils Relative: 0 % (ref 0–5)
HCT: 38.2 % (ref 36.0–46.0)
HEMOGLOBIN: 12.5 g/dL (ref 12.0–15.0)
LYMPHS ABS: 1.3 10*3/uL (ref 0.7–4.0)
Lymphocytes Relative: 19 % (ref 12–46)
MCH: 31.4 pg (ref 26.0–34.0)
MCHC: 32.7 g/dL (ref 30.0–36.0)
MCV: 96 fL (ref 78.0–100.0)
Monocytes Absolute: 0.6 10*3/uL (ref 0.1–1.0)
Monocytes Relative: 9 % (ref 3–12)
Neutro Abs: 5.2 10*3/uL (ref 1.7–7.7)
Neutrophils Relative %: 72 % (ref 43–77)
PLATELETS: 225 10*3/uL (ref 150–400)
RBC: 3.98 MIL/uL (ref 3.87–5.11)
RDW: 14 % (ref 11.5–15.5)
WBC: 7.1 10*3/uL (ref 4.0–10.5)

## 2015-01-23 MED ORDER — ACETAMINOPHEN 160 MG/5ML PO SOLN: 650.0000 mg | ORAL | Status: DC | PRN

## 2015-01-23 MED ORDER — IOHEXOL 300 MG/ML  SOLN
50.0000 mL | Freq: Once | INTRAMUSCULAR | Status: AC | PRN
  Administered 2015-01-23: 20 mL via ORAL

## 2015-01-23 NOTE — Progress Notes (Signed)
Patient is alert and oriented.  Pain is controlled, and patient is tolerating fluids.  Plan to advance to protein shake today.  Reviewed Adjustable gastric band discharge instructions with patient, patient able to articulate understanding.  Provided information on BELT program, Support Group and [facility] outpatient pharmacy. All questions answered, will continue to monitor.

## 2015-01-23 NOTE — Discharge Summary (Signed)
Physician Discharge Summary  Patient ID: Maureen [name] MRN: [id number] DOB/AGE: [birth year] 60 y.o.  Admit date: 01/22/2015 Discharge date: 01/23/2015  Admission Diagnoses:  GERD with lapband  Discharge Diagnoses:  same  Active Problems:   Gastric band malfunction   Surgery:  Takedown and replication of lapband  Discharged Condition: improved  Hospital Course:   Had surgery.  UGI on PD 1.  Ready for discharge  Consults: none  Significant Diagnostic Studies: UGI    Discharge Exam: Blood pressure 160/88, pulse 86, temperature 98.1 F (36.7 C), temperature source Oral, resp. rate 16, height 5' 0.5" (1.537 m), weight 102.513 kg (226 lb), SpO2 95 %. Incisions OK  Disposition:   Discharge Instructions    Ambulate hourly while awake    Complete by:  As directed      Call MD for:  difficulty breathing, headache or visual disturbances    Complete by:  As directed      Call MD for:  persistant dizziness or light-headedness    Complete by:  As directed      Call MD for:  persistant nausea and vomiting    Complete by:  As directed      Call MD for:  redness, tenderness, or signs of infection (pain, swelling, redness, odor or green/yellow discharge around incision site)    Complete by:  As directed      Call MD for:  severe uncontrolled pain    Complete by:  As directed      Call MD for:  temperature >101 F    Complete by:  As directed      Diet bariatric full liquid    Complete by:  As directed      Discharge instructions    Complete by:  As directed   Follow bariatric dietary guidelines for lapband     Incentive spirometry    Complete by:  As directed   Perform hourly while awake            Medication List    TAKE these medications        acetaminophen 500 MG tablet  Commonly known as:  TYLENOL  Take 1,000 mg by mouth every 6 (six) hours as needed for moderate pain or headache.     acetaminophen 160 MG/5ML solution  Commonly known as:  TYLENOL  Take 20.3 mLs  (650 mg total) by mouth every 4 (four) hours as needed (headache).     loratadine 10 MG tablet  Commonly known as:  CLARITIN  Take 10 mg by mouth every evening.     meloxicam 7.5 MG tablet  Commonly known as:  MOBIC  TAKE ONE TABLET BY MOUTH ONCE DAILY WITH FOOD     multivitamin capsule  Take 1 capsule by mouth at bedtime.     PROTONIX PO  Take 20 mg by mouth every evening.     venlafaxine XR 75 MG 24 hr capsule  Commonly known as:  EFFEXOR-XR  Take 1 capsule by mouth  daily with breakfast     VITAMIN D-3 PO  Take 2,000 Units by mouth every evening.           Follow-up Information    Follow up with Pedro Earls, MD.   Specialty:  General Surgery   Contact information:   [address] [place] [address] [phone]       Signed: Pedro Earls 01/23/2015, 8:56 AM

## 2015-01-23 NOTE — Progress Notes (Signed)
Patient is alert, oriented, stable and pain controlled with appropriate interventions.  Discharge instructions given to patient along with note to return to work. All questions and concerns answered. Patient able to verbalize understanding of instructions and  Patient tolerated IV removal and bariatric diet well prior to discharge. 01/23/2015 Maureen [name] 12:14 PM

## 2015-01-23 NOTE — Plan of Care (Signed)
Problem: Food- and Nutrition-Related Knowledge Deficit (NB-1.1) Goal: Nutrition education Formal process to instruct or train a patient/client in a skill or to impart knowledge to help patients/clients voluntarily manage or modify food choices and eating behavior to maintain or improve health. Outcome: Completed/Met Date Met:  01/23/15 Nutrition Education Note  Received consult for diet education per DROP protocol. Diet education provided by Bariatric Nurse coordinator. Pt with no questions for RD regarding diet at this time.  Discussed 2 week post op diet with pt. Emphasized that liquids must be non carbonated, non caffeinated, and sugar free. Fluid goals discussed. Pt to follow up with outpatient bariatric RD for further diet progression after 2 weeks. Multivitamins and minerals also reviewed. Teach back method used, pt expressed understanding, expect good compliance.   Diet: First 2 Weeks  You will see the nutritionist about two (2) weeks after your surgery. The nutritionist will increase the types of foods you can eat if you are handling liquids well:  If you have severe vomiting or nausea and cannot handle clear liquids lasting longer than 1 day, call your surgeon  Protein Shake  Drink at least 2 ounces of shake 5-6 times per day  Each serving of protein shakes (usually 8 - 12 ounces) should have a minimum of:  15 grams of protein  And no more than 5 grams of carbohydrate  Goal for protein each day:  Men = 80 grams per day  Women = 60 grams per day  Protein powder may be added to fluids such as non-fat milk or Lactaid milk or Soy milk (limit to 35 grams added protein powder per serving)   Hydration  Slowly increase the amount of water and other clear liquids as tolerated (See Acceptable Fluids)  Slowly increase the amount of protein shake as tolerated  Sip fluids slowly and throughout the day  May use sugar substitutes in small amounts (no more than 6 - 8 packets per day; i.e.  Splenda)   Fluid Goal  The first goal is to drink at least 8 ounces of protein shake/drink per day (or as directed by the nutritionist); some examples of protein shakes are [employer], [employer], EAS Edge HP, and Unjury. See handout from pre-op Bariatric Education Class:  Slowly increase the amount of protein shake you drink as tolerated  You may find it easier to slowly sip shakes throughout the day  It is important to get your proteins in first  Your fluid goal is to drink 64 - 100 ounces of fluid daily  It may take a few weeks to build up to this  32 oz (or more) should be clear liquids  And  32 oz (or more) should be full liquids (see below for examples)  Liquids should not contain sugar, caffeine, or carbonation   Clear Liquids:  Water or Sugar-free flavored water (i.e. Fruit H2O, Propel)  Decaffeinated coffee or tea (sugar-free)  Crystal Lite, Wyler's Lite, Minute Maid Lite  Sugar-free Jell-O  Bouillon or broth  Sugar-free Popsicle: *Less than 20 calories each; Limit 1 per day   Full Liquids:  Protein Shakes/Drinks + 2 choices per day of other full liquids  Full liquids must be:  No More Than 12 grams of Carbs per serving  No More Than 3 grams of Fat per serving  Strained low-fat cream soup  Non-Fat milk  Fat-free Lactaid Milk  Sugar-free yogurt (Dannon Lite & Fit, Greek yogurt)     Clayton Bibles, MS, RD, LDN Pager: [phone]  After Hours Pager: [phone]

## 2015-01-23 NOTE — Op Note (Signed)
NAMEADRIEANNA, Maureen [name]                ACCOUNT NO.:  [id number]  MEDICAL RECORD NO.:  [id number]  LOCATION:                                 FACILITY:  PHYSICIAN:  Isabel Caprice. Hassell Done, MD  DATE OF BIRTH:  08/28/1956  DATE OF PROCEDURE:  01/22/2015 DATE OF DISCHARGE:                              OPERATIVE REPORT   PREOPERATIVE DIAGNOSIS:  Laparoscopic adjustable gastric banding with inability to  adjust because of reflux and possible hiatal hernia.  POSTOPERATIVE DIAGNOSIS:  Laparoscopic adjustable gastric banding with inability to  adjust because of reflux and possible hiatal hernia. Status post takedown of band, replication in upper endoscopy.  SURGEON:  Isabel Caprice. Hassell Done, MD.  ASSISTANTMarland Kitchen T. Hoxworth, M.D.  ANESTHESIA:  General endotracheal.  DESCRIPTION OF PROCEDURE:  The patient was taken to OR 1 and given general anesthesia.  The abdomen is prepped with PCMX and draped sterilely.  A time-out was performed.  Access to the abdomen was achieved through the left upper quadrant with a 5 mm Optiview without difficulty.  All 5 mm trocars were used except upgraded 1 slightly to the right of midline with 10-11 for suturing purposes.  From the midline, a Nathanson retractor was inserted for retraction of the liver. Following retraction of  the liver, I used sharp dissection to take down the adhesions around the band and ultimately to buckle the band.  I went to the right side, and I have identified its area where it was passed anterior to the right crus posteriorly.  It was also where she had her posterior hiatal hernia repair and there was a lot of fixation there. Anteriorly, there was an area where we thought maybe it could possibly be slipping just about a 0.5 cm or so but there was no obvious hiatal hernia.  This was ascertained after we had completely taken down the band, cut the cicatrix, and I had completely freed it.  I dissected anteriorly and again could not discern a  hiatal hernia  interface that I could dissect free.  I performed upper endoscopy which showed that in the terminal portion of the esophagus hit a kind of a corkscrew, and we identified the Z-line, which appeared to be below the diaphragm.  We did have when taking down anteriorly, there was a little bleeding, I put a couple of clips on the vessel.  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  We did this over the calibration tubing.  First, we passed the calibration tubing and came back with 10 mL in the balloon had actually held up at the position of the band.  We then fastened it with the tubing in place and then removed the tubing.  The band was then plicated with 3 sutures Surgidac taking deep bites and securing with tie knots and everything looked to be in good position and looked normal.  Port sites were all injected with Exparel.  Abdomen was deflated and wounds were closed with 4-0 Vicryl with Dermabond.  The patient tolerated the  procedure well, and she was taken to recovery in satisfactory condition.     Isabel Caprice Hassell Done, MD     MBM/MEDQ  D:  01/22/2015  T:  01/22/2015  Job:  [id number]

## 2015-01-23 NOTE — Discharge Instructions (Signed)
Gastric Banding Surgery Care After Refer to this sheet in the next few weeks. These discharge instructions provide you with general information on caring for yourself after you leave the hospital. Your caregiver may also give you specific instructions. Your treatment has been planned according to the most current medical practices available, but unavoidable complications sometimes occur. If you have any problems or questions after discharge, call your caregiver. HOME CARE INSTRUCTIONS  Activity  Take frequent walks throughout the day. This will help to prevent blood clots. Do not sit for longer than 45 minutes to 1 hour while awake for 4 to 6 weeks after surgery.  Continue to do coughing and deep breathing exercises once you get home. This will help to prevent pneumonia.  Do not do strenuous activities, such as heavy lifting, pushing, or pulling, until after your follow-up visit with your caregiver. Do not lift anything heavier than 10 lb (4.5 kg).  Talk with your caregiver about when you may return to work and your exercise routine.  Do not drive while taking prescription pain medicine. Nutrition  It is very important that you drink at least 80 oz (2,400 mL) of fluid a day.  You should stay on a liquid diet until your follow-up visit with your caregiver. Keep sugar-free, liquid items on hand, including:  Tea: hot or cold. Drink only decaffeinated for the first month.  Broths: beef, chicken, vegetable.  Others: water, sugar-free frozen ice pops, flavored water, gelatin (after 1 week).  Do not consume caffeine for 1 month. Large amounts of caffeine can cause dehydration.  A dietician may also give you specific instructions.  Follow your caregiver's recommendations about vitamins and protein requirements after surgery. Hygiene  You may shower and wash your hair 2 days after surgery. Pat incisions dry. Do not rub incisions with a washcloth or towel.  Follow your caregiver's  recommendations about baths and pools following surgery. Pain control  If a prescription medicine was given, follow your caregiver's directions.  You may feel some gas pain caused by the carbon dioxide used to inflate your abdomen during surgery. This pain can be felt in your chest, shoulder, back, or abdominal area. Moving around often is advised. Incision care You may have 4 or more small incisions. They are closed with skin adhesive strips and have a clear plastic covering over them. You may remove your dressings the number of days directed by your caregiver after surgery. Check your incisions and surrounding area daily for any redness, swelling, discoloration, fluid (drainage), or bleeding. Dark red, dried blood may appear under these coverings. This is normal. SEEK MEDICAL CARE IF:   You develop persistent nausea and vomiting.  You have pain and discomfort with swallowing.  You have pain, swelling, or warmth in the lower extremities.  You have an oral temperature above 102 F (38.9 C).  You develop chills.  Your incision sites look red, swollen, or have drainage.  Your stool is black, tarry, or maroon in color.  You are lightheaded when standing.  You have any questions or concerns. SEEK IMMEDIATE MEDICAL CARE IF:   You have chest pain.  You have severe calf pain or pain not relieved by medicine.  You develop shortness of breath or difficulty breathing.  You feel confused.  You have slurred speech.  You suddenly feel weak. MAKE SURE YOU:   Understand these instructions.  Will watch your condition.  Will get help right away if you are not doing well or get worse. Document Released:  03/31/2004 Document Revised: 12/12/2012 Document Reviewed: 01/07/2010 ExitCare Patient Information 2015 [place], [place]. This information is not intended to replace advice given to you by your health care provider. Make sure you discuss any questions you have with your health care  provider.                    ADJUSTABLE GASTRIC BAND  Home Care Instructions   These instructions are to help you care for yourself when you go home.  Call: If you have any problems. Call [phone] and ask for the surgeon on call If you need immediate assistance come to the ER at [facility]. Tell the ER staff you are a new post-op gastric banding patient  Signs and symptoms to report: Severe  vomiting or nausea If you cannot handle clear liquids for longer than 1 day, call your surgeon Abdominal pain which does not get better after taking your pain medication Fever greater than 100.4  F and chills Heart rate over 100 beats a minute Trouble breathing Chest pain Redness,  swelling, drainage, or foul odor at incision (surgical) sites If your incisions open or pull apart Swelling or pain in calf (lower leg) Diarrhea (Loose bowel movements that happen often), frequent watery, uncontrolled bowel movements Constipation, (no bowel movements for 3 days) if this happens: Take Milk of Magnesia, 2 tablespoons by mouth, 3 times a day for 2 days if needed Stop taking Milk of Magnesia once you have had a bowel movement Call your doctor if constipation continues Or Take Miralax  (instead of Milk of Magnesia) following the label instructions Stop taking Miralax once you have had a bowel movement Call your doctor if constipation continues Anything you think is abnormal for you   Normal side effects after surgery: Unable to sleep at night or unable to concentrate Irritability Being tearful (crying) or depressed  These are common complaints, possibly related to your anesthesia, stress of surgery, and change in lifestyle, that usually go away a few weeks after surgery. If these feelings continue, call your medical doctor.  Wound Care: You may have surgical glue, steri-strips, or staples over your incisions after surgery Surgical glue: Looks like clear film over your incisions and will wear  off a little at a time Steri-strips: Adhesive strips of tape over your incisions. You may notice a yellowish color on skin under the steri-strips. This is used to make the steri-strips stick better. Do not pull the steri-strips off - let them fall off Staples: Staples may be removed before you leave the hospital If you go home with staples, call [facility] for an appointment with your surgeons nurse to have staples removed 10 days after surgery, [phone] Showering: You may shower two (2) days after your surgery unless your surgeon tells you differently Wash gently around incisions with warm soapy water, rinse well, and gently pat dry If you have a drain (tube from your incision), you may need someone to hold this while you shower No tub baths until staples are removed and incisions are healed   Medications: Medications should be liquid or crushed if larger than the size of a dime Extended release pills (medication that releases a little bit at a time through the  day) should not be crushed Depending on the size and number of medications you take, you may need to space (take a few throughout the day)/change the time you take your medications so that you do not over-fill your pouch (smaller stomach) Make  sure you follow-up with you primary care physician to make medication changes needed during rapid weight loss and life -style changes If you have diabetes, follow up with your doctor that orders your diabetes medication(s) within one week after surgery and check your blood sugar regularly  Do not drive while taking narcotics (pain medications)  Do not take acetaminophen (Tylenol) and Roxicet or Lortab Elixir at the same time since these pain medications contain acetaminophen   Diet:  First 2 Weeks You will see the nutritionist about two (2) weeks after your surgery. The nutritionist will increase the types of foods you can eat if you are handling liquids well: If you have  severe vomiting or nausea and cannot handle clear liquids lasting longer than 1 day call your surgeon For Same Day Surgery Discharge Patients: The day of surgery drink water only: 2 ounces every 4 hours If you are handling water, start drinking your high protein shake the next morning For Overnight Stay Patients: Begin by drinking 2 ounces of a high protein every 3 hours, 5-6 times per day Slowly increase the amount you drink as tolerated You may find it easier to slowly sip shakes throughout the day. It is important to get your proteins in first    Protein Shake Drink at least 2 ounces of shake 5-6 times per day Each serving of protein shakes (usually 8-12 ounces) should have a minimum of: 15 grams of protein And no more than 5 grams of carbohydrate Goal for protein each day: Men = 80 grams per day Women = 60 grams per day Protein powder may be added to fluids such as non-fat milk or Lactaid milk or Soy milk (limit to 35 grams added protein powder per serving)  Hydration Slowly increase the amount of water and other clear liquids as tolerated (See Acceptable Fluids) Slowly increase the amount of protein shake as tolerated Sip fluids slowly and throughout the day May use sugar substitutes in small amounts (no more than 6-8 packets per day; i.e. Splenda)  Fluid Goal The first goal is to drink at least 8 ounces of protein shake/drink per day (or as directed by the nutritionist); some examples of protein shakes are Syntrax, Nectar, [employer], EAS Edge HP, and Unjury. - See handout from pre-op Bariatric Education Class: Slowly increase the amount of protein shake you drink as tolerated You may find it easier to slowly sip shakes throughout the day It is important to get your proteins in first Your fluid goal is to drink 64-100 ounces of fluid daily It may take a few weeks to build up to this  32 oz. (or more) should be full liquids (see below for examples) Liquids should not  contain sugar, caffeine, or carbonation  Clear Liquids: Water of Sugar-free flavored water (i.e. Fruit HO, Propel) Decaffeinated coffee or tea (sugar-free) Crystal lite, Wylers Lite, Minute Maid Lite Sugar-free Jell-O Bouillon or broth Sugar-free Popsicle:    - Less than 20 calories each; Limit 1 per day        Full Liquids:                   Protein Shakes/Drinks + 2 choices per day of other full liquids Full liquids must be: No More Than 12 grams of Carbs per serving No More Than 3 grams of Fat per serving Strained low-fat cream soup Non-Fat milk Fat-free Lactaid Milk Sugar-free yogurt (Dannon Lite & Fit, Greek yogurt)   Vitamins and Minerals Start 1 day after  surgery unless otherwise directed by your surgeon 1 Chewable Multivitamin / Multimineral Supplement with iron (i.e. Centrum for Adults) Chewable Calcium Citrate with Vitamin D-3 (Example: 3 Chewable Calcium  Plus 600 with vitamin D-3) Take 500 mg three (3) times a day for a total of 1500 mg per day Do not take all 3 doses of calcium at one time as it may cause constipation, and you can only absorb 500 mg at a time Do not mix multivitamins containing iron with calcium supplements;  take 2 hours apart Do not substitute Tums (calcium carbonate) for your calcium Menstruating women and those at risk for anemia ( a blood disease that causes weakness) may need extra iron Talk to your doctor to see if you need more iron If you need extra iron: total daily iron recommendation (including Vitamins) is 50 to 100 mg Iron/day Do not stop taking or change any vitamins or minerals until you talk to your nutritionist or surgeon Your nutritionist and/or surgeon must approve all vitamin and mineral supplements  Activity and Exercise: It is important to continue walking at home. Limit your physical activity as instructed by your doctor. During this time, use these guidelines: Do not lift anything greater than ten  (10) pounds for at least  two (2) weeks Do not go back to work or drive until Engineer, production says you can You may have sex when you feel comfortable It is VERY important for female patients to use a reliable birth control method; fertility often increase after surgery Do not get pregnant for at least 18 months Start exercising as soon as your doctor tells you that you can Make sure your doctor approves any physical activity Start with a simple walking program Walk 5-15 minutes each day, 7 days per week Slowly increase until you are walking 30-45 minutes per day Consider joining our [place] program. [phone] or email [email or web address]    Special Instructions  Things to remember: Free counseling is available for you and your family through collaboration between [facility] and [facility]. Please call [phone] and leave a message Use your CPAP when sleeping if this applies to you Consider buying a medical alert bracelet that says you had lap-band surgery You will likely have your first fill (fluid added to your band) 6 - 8 weeks after surgery [facility] has a free Bariatric Surgery Support Group that meets monthly, the 3rd Thursday, [place]. You can see classes online at [email or web address] It is very important to keep all follow up appointments with your surgeon, nutritionist, primary care physician, and behavioral health practitioner After the first year, please follow up with your bariatric surgeon and nutritionist at least once a year in order to maintain best weight loss results                    [facility]:  [place]: [phone]               Bariatric Nurse Coordinator: [phone]      Adjustable Gastric Band Home Care Instructions  Rev. 10/2012  Reviewed and Endorsed                                                    by [facility] Patient Education Committee, Jan, 2014

## 2015-01-25 ENCOUNTER — Telehealth (HOSPITAL_COMMUNITY): Payer: Self-pay

## 2015-01-25 NOTE — Telephone Encounter (Signed)
Made discharge phone call to patient per DROP protocol. Asking the following questions.    1. Do you have someone to care for you now that you are home?  yes 2. Are you having pain now that is not relieved by your pain medication?  No, im not taking pain medicine 3. Are you able to drink the recommended daily amount of fluids (48 ounces minimum/day) and protein (60-80 grams/day) as prescribed by the dietitian or nutritional counselor?  Yes I am doing that 4. Are you taking the vitamins and minerals as prescribed?  yes 5. Do you have the "on call" number to contact your surgeon if you have a problem or question?  yes 6. Are your incisions free of redness, swelling or drainage? (If steri strips, address that these can fall off, shower as tolerated) yes 7. Have your bowels moved since your surgery?  If not, are you passing gas?  yes 8. Are you up and walking 3-4 times per day?  yes    1. Do you have an appointment made to see your surgeon in the next month?  yes 2. Were you provided your discharge medications before your surgery or before you were discharged from the hospital and are you taking them without problem?  yes 3. Were you provided phone numbers to the clinic/surgeon's office?  yes 4. Did you watch the patient education video module in the (clinic, surgeon's office, etc.) before your surgery? no 5. Do you have a discharge checklist that was provided to you in the hospital to reference with instructions on how to take care of yourself after surgery?  yes 6. Did you see a dietitian or nutritional counselor while you were in the hospital?  yes 7. Do you have an appointment to see a dietitian or nutritional counselor in the next month?  No, working on getting one

## 2015-01-29 ENCOUNTER — Other Ambulatory Visit: Payer: Self-pay

## 2015-01-29 MED ORDER — VENLAFAXINE HCL ER 75 MG PO CP24: 75.0000 mg | ORAL_CAPSULE | Freq: Every day | ORAL | Status: DC

## 2015-01-31 ENCOUNTER — Other Ambulatory Visit: Payer: Self-pay

## 2015-01-31 MED ORDER — VENLAFAXINE HCL ER 75 MG PO CP24: 75.0000 mg | ORAL_CAPSULE | Freq: Every day | ORAL | Status: DC

## 2015-02-27 ENCOUNTER — Encounter: Payer: Self-pay | Admitting: Dietician

## 2015-02-27 ENCOUNTER — Encounter: Payer: 59 | Attending: Surgery | Admitting: Dietician

## 2015-02-27 DIAGNOSIS — Z6841 Body Mass Index (BMI) 40.0 and over, adult: Secondary | ICD-10-CM | POA: Insufficient documentation

## 2015-02-27 DIAGNOSIS — Z713 Dietary counseling and surveillance: Secondary | ICD-10-CM | POA: Insufficient documentation

## 2015-02-27 NOTE — Patient Instructions (Addendum)
-  Resume exercise routine -Aim for 60-90 grams of lean protein per day -Continue to focus on lean protein + non starchy vegetables

## 2015-02-27 NOTE — Progress Notes (Signed)
  Medical Nutrition Therapy:  Appt start time: 0800 end time:  0830.   Assessment:  Primary concerns today: Maureen [name] is here today stating that Dr. Hassell Done sent her here. She has had Lap Band for 5 years. She recently had a hernia repair in May 2016 and had to have the fluid removed from her band. She got a fill 3 weeks ago and has another fill scheduled for tomorrow. She reports looking forward to having more fluid added to her band as she is "feeling hungry all the time." Maureen [name] lives with her husband and has an adult son that lives nearby. She works at [employer]. She reports trying to get back on track and avoid carbs and sweets. She still does not drink while she eats. Her goal weight is 160 lbs. Her lowest weight after surgery was 173 lbs and she subsequently regained to 227 lbs; highest weight was 253 lbs prior to surgery.   Preferred Learning Style:   No preference indicated   Learning Readiness:   Ready   MEDICATIONS: see list   DIETARY INTAKE:  Usual eating pattern includes 3 meals and 2-3 snacks per day. Avoided foods include carbs and sweets, large amounts of beef.    24-hr recall:  B ( AM): Atkins protein shake  Snk ( AM): cheese stick   L ( PM): chili or chicken or grilled shrimp or salad Snk ( PM): cheese stick  D ( PM): Dannon Light and Fit Greek yogurt, green beans, avocado, squash, brussels sprouts, beans Snk ( PM):   Beverages: unsweet tea, water with sugar free flavor, diet Twist (64 oz)  Usual physical activity: not as much lately  Estimated energy needs: [phone] calories 60-90 g protein  Progress Towards Goal(s):  In progress.   Nutritional Diagnosis:  Maureen [name]-3.3 Overweight/obesity related to past poor dietary habits and physical inactivity as evidenced by patient w/ LAGB surgery following dietary guidelines for continued weight loss.     Intervention:  Nutrition counseling provided. Reviewed nutrition recommendations for LAGB. Praised patient on getting back on  track and beginning to lose weight. Encouraged her to continue to focus on lean protein and non starchy vegetables and resume exercise routine.   Samples provided and patient instructed on proper use: Unjury protein powder (chocolate - qty 3) Lot#: [id number] Exp: 02/2016  Teaching Method Utilized: Visual Auditory  Handouts given during visit include:  Bariatric snack ideas  Barriers to learning/adherence to lifestyle change: none  Demonstrated degree of understanding via:  Teach Back   Monitoring/Evaluation:  Dietary intake, exercise, and body weight prn.

## 2015-07-30 ENCOUNTER — Other Ambulatory Visit: Payer: Self-pay | Admitting: Physician Assistant

## 2015-07-30 ENCOUNTER — Ambulatory Visit (HOSPITAL_COMMUNITY)
Admission: RE | Admit: 2015-07-30 | Discharge: 2015-07-30 | Disposition: A | Discharge status: Home / Self Care | Payer: 59 | Source: Ambulatory Visit | Attending: Physician Assistant | Admitting: Physician Assistant

## 2015-07-30 ENCOUNTER — Encounter: Payer: Self-pay | Admitting: Physician Assistant

## 2015-07-30 ENCOUNTER — Ambulatory Visit (INDEPENDENT_AMBULATORY_CARE_PROVIDER_SITE_OTHER): Payer: 59 | Admitting: Physician Assistant

## 2015-07-30 VITALS — BP 126/74 | HR 93 | Temp 97.0°F | Resp 16 | Ht 60.0 in | Wt 226.0 lb

## 2015-07-30 DIAGNOSIS — E785 Hyperlipidemia, unspecified: Secondary | ICD-10-CM | POA: Diagnosis not present

## 2015-07-30 DIAGNOSIS — M5136 Other intervertebral disc degeneration, lumbar region: Secondary | ICD-10-CM | POA: Insufficient documentation

## 2015-07-30 DIAGNOSIS — M5441 Lumbago with sciatica, right side: Secondary | ICD-10-CM

## 2015-07-30 DIAGNOSIS — I10 Essential (primary) hypertension: Secondary | ICD-10-CM

## 2015-07-30 DIAGNOSIS — Z79899 Other long term (current) drug therapy: Secondary | ICD-10-CM

## 2015-07-30 DIAGNOSIS — M479 Spondylosis, unspecified: Secondary | ICD-10-CM | POA: Diagnosis not present

## 2015-07-30 DIAGNOSIS — M545 Low back pain: Secondary | ICD-10-CM | POA: Insufficient documentation

## 2015-07-30 DIAGNOSIS — M79604 Pain in right leg: Secondary | ICD-10-CM | POA: Diagnosis not present

## 2015-07-30 DIAGNOSIS — R7303 Prediabetes: Secondary | ICD-10-CM

## 2015-07-30 MED ORDER — PREGABALIN 50 MG PO CAPS: 50.0000 mg | ORAL_CAPSULE | Freq: Three times a day (TID) | ORAL | Status: DC

## 2015-07-30 MED ORDER — PREDNISONE 20 MG PO TABS: ORAL_TABLET | ORAL | Status: DC

## 2015-07-30 NOTE — Patient Instructions (Signed)
Can take the lyrica samples for nerve pain. It can make you sleepy so we suggest trying it at night first and please plan to not drive or do anything strenuous. Also please do not take this medication with alcohol.  Start out 1 pill at night before bed, can increase to 2 pills at night before bed. Please call the office if you have any side effects.   Can take 3 pills a day however you would like  Some examples: - 1 breakfast, lunch, bedtime. - 1 at breakfast, 2 at bed time  How should I use this medicine? Take this medicine by mouth with a glass of water. Follow the directions on the prescription label. You can take this medicine with or without food. Take your doses at regular intervals. Do not take your medicine more often than directed. Do not stop taking except on your doctor's advice.  What if I miss a dose? If you miss a dose, take it as soon as you can. If it is almost time for your next dose, take only that dose. Do not take double or extra doses.  What should I watch for while using this medicine? Tell your doctor or healthcare professional if your symptoms do not start to get better or if they get worse.   You may get drowsy or dizzy. Do not drive, use machinery, or do anything that needs mental alertness until you know how this medicine affects you. Do not stand or sit up quickly, especially if you are an older patient. This reduces the risk of dizzy or fainting spells. Alcohol may interfere with the effect of this medicine. Avoid alcoholic drinks. If you have a heart condition, like congestive heart failure, and notice that you are retaining water and have swelling in your hands or feet, contact your health care provider immediately.  What side effects may I notice from receiving this medicine? Side effects that you should report to your doctor or health care professional as soon as possible and are very rare: -allergic reactions like skin rash, itching or hives, swelling of the  face, lips, or tongue -breathing problems -changes in vision -jerking or unusual movements of any part of your body -suicidal thoughts or other mood changes -swelling of the ankles, feet, hands -unusual bruising or bleeding  Side effects that usually do not require medical attention (Report these to your doctor or health care professional if they continue or are bothersome.): -dizziness -drowsiness -dry mouth -nausea -tremors    Sciatica With Rehab The sciatic nerve runs from the back down the leg and is responsible for sensation and control of the muscles in the back (posterior) side of the thigh, lower leg, and foot. Sciatica is a condition that is characterized by inflammation of this nerve.  SYMPTOMS   Signs of nerve damage, including numbness and/or weakness along the posterior side of the lower extremity.  Pain in the back of the thigh that may also travel down the leg.  Pain that worsens when sitting for long periods of time.  Occasionally, pain in the back or buttock. CAUSES  Inflammation of the sciatic nerve is the cause of sciatica. The inflammation is due to something irritating the nerve. Common sources of irritation include:  Sitting for long periods of time.  Direct trauma to the nerve.  Arthritis of the spine.  Herniated or ruptured disk.  Slipping of the vertebrae (spondylolisthesis).  Pressure from soft tissues, such as muscles or ligament-like tissue (fascia). RISK INCREASES WITH:  Sports that place pressure or stress on the spine (football or weightlifting).  Poor strength and flexibility.  Failure to warm up properly before activity.  Family history of low back pain or disk disorders.  Previous back injury or surgery.  Poor body mechanics, especially when lifting, or poor posture. PREVENTION   Warm up and stretch properly before activity.  Maintain physical fitness:  Strength, flexibility, and endurance.  Cardiovascular  fitness.  Learn and use proper technique, especially with posture and lifting. When possible, have coach correct improper technique.  Avoid activities that place stress on the spine. PROGNOSIS If treated properly, then sciatica usually resolves within 6 weeks. However, occasionally surgery is necessary.  RELATED COMPLICATIONS   Permanent nerve damage, including pain, numbness, tingle, or weakness.  Chronic back pain.  Risks of surgery: infection, bleeding, nerve damage, or damage to surrounding tissues. TREATMENT Treatment initially involves resting from any activities that aggravate your symptoms. The use of ice and medication may help reduce pain and inflammation. The use of strengthening and stretching exercises may help reduce pain with activity. These exercises may be performed at home or with referral to a therapist. A therapist may recommend further treatments, such as transcutaneous electronic nerve stimulation (TENS) or ultrasound. Your caregiver may recommend corticosteroid injections to help reduce inflammation of the sciatic nerve. If symptoms persist despite non-surgical (conservative) treatment, then surgery may be recommended. MEDICATION  If pain medication is necessary, then nonsteroidal anti-inflammatory medications, such as aspirin and ibuprofen, or other minor pain relievers, such as acetaminophen, are often recommended.  Do not take pain medication for 7 days before surgery.  Prescription pain relievers may be given if deemed necessary by your caregiver. Use only as directed and only as much as you need.  Ointments applied to the skin may be helpful.  Corticosteroid injections may be given by your caregiver. These injections should be reserved for the most serious cases, because they may only be given a certain number of times. HEAT AND COLD  Cold treatment (icing) relieves pain and reduces inflammation. Cold treatment should be applied for 10 to 15 minutes every 2 to  3 hours for inflammation and pain and immediately after any activity that aggravates your symptoms. Use ice packs or massage the area with a piece of ice (ice massage).  Heat treatment may be used prior to performing the stretching and strengthening activities prescribed by your caregiver, physical therapist, or athletic trainer. Use a heat pack or soak the injury in warm water. SEEK MEDICAL CARE IF:  Treatment seems to offer no benefit, or the condition worsens.  Any medications produce adverse side effects. EXERCISES  RANGE OF MOTION (ROM) AND STRETCHING EXERCISES - Sciatica Most people with sciatic will find that their symptoms worsen with either excessive bending forward (flexion) or arching at the low back (extension). The exercises which will help resolve your symptoms will focus on the opposite motion. Your physician, physical therapist or athletic trainer will help you determine which exercises will be most helpful to resolve your low back pain. Do not complete any exercises without first consulting with your clinician. Discontinue any exercises which worsen your symptoms until you speak to your clinician. If you have pain, numbness or tingling which travels down into your buttocks, leg or foot, the goal of the therapy is for these symptoms to move closer to your back and eventually resolve. Occasionally, these leg symptoms will get better, but your low back pain may worsen; this is typically an indication of  progress in your rehabilitation. Be certain to be very alert to any changes in your symptoms and the activities in which you participated in the 24 hours prior to the change. Sharing this information with your clinician will allow him/her to most efficiently treat your condition. These exercises may help you when beginning to rehabilitate your injury. Your symptoms may resolve with or without further involvement from your physician, physical therapist or athletic trainer. While completing  these exercises, remember:   Restoring tissue flexibility helps normal motion to return to the joints. This allows healthier, less painful movement and activity.  An effective stretch should be held for at least 30 seconds.  A stretch should never be painful. You should only feel a gentle lengthening or release in the stretched tissue. FLEXION RANGE OF MOTION AND STRETCHING EXERCISES: STRETCH - Flexion, Single Knee to Chest   Lie on a firm bed or floor with both legs extended in front of you.  Keeping one leg in contact with the floor, bring your opposite knee to your chest. Hold your leg in place by either grabbing behind your thigh or at your knee.  Pull until you feel a gentle stretch in your low back. Hold __________ seconds.  Slowly release your grasp and repeat the exercise with the opposite side. Repeat __________ times. Complete this exercise __________ times per day.  STRETCH - Flexion, Double Knee to Chest  Lie on a firm bed or floor with both legs extended in front of you.  Keeping one leg in contact with the floor, bring your opposite knee to your chest.  Tense your stomach muscles to support your back and then lift your other knee to your chest. Hold your legs in place by either grabbing behind your thighs or at your knees.  Pull both knees toward your chest until you feel a gentle stretch in your low back. Hold __________ seconds.  Tense your stomach muscles and slowly return one leg at a time to the floor. Repeat __________ times. Complete this exercise __________ times per day.  STRETCH - Low Trunk Rotation   Lie on a firm bed or floor. Keeping your legs in front of you, bend your knees so they are both pointed toward the ceiling and your feet are flat on the floor.  Extend your arms out to the side. This will stabilize your upper body by keeping your shoulders in contact with the floor.  Gently and slowly drop both knees together to one side until you feel a  gentle stretch in your low back. Hold for __________ seconds.  Tense your stomach muscles to support your low back as you bring your knees back to the starting position. Repeat the exercise to the other side. Repeat __________ times. Complete this exercise __________ times per day  EXTENSION RANGE OF MOTION AND FLEXIBILITY EXERCISES: STRETCH - Extension, Prone on Elbows  Lie on your stomach on the floor, a bed will be too soft. Place your palms about shoulder width apart and at the height of your head.  Place your elbows under your shoulders. If this is too painful, stack pillows under your chest.  Allow your body to relax so that your hips drop lower and make contact more completely with the floor.  Hold this position for __________ seconds.  Slowly return to lying flat on the floor. Repeat __________ times. Complete this exercise __________ times per day.  RANGE OF MOTION - Extension, Prone Press Ups  Lie on your stomach on the  floor, a bed will be too soft. Place your palms about shoulder width apart and at the height of your head.  Keeping your back as relaxed as possible, slowly straighten your elbows while keeping your hips on the floor. You may adjust the placement of your hands to maximize your comfort. As you gain motion, your hands will come more underneath your shoulders.  Hold this position __________ seconds.  Slowly return to lying flat on the floor. Repeat __________ times. Complete this exercise __________ times per day.  STRENGTHENING EXERCISES - Sciatica  These exercises may help you when beginning to rehabilitate your injury. These exercises should be done near your "sweet spot." This is the neutral, low-back arch, somewhere between fully rounded and fully arched, that is your least painful position. When performed in this safe range of motion, these exercises can be used for people who have either a flexion or extension based injury. These exercises may resolve your  symptoms with or without further involvement from your physician, physical therapist or athletic trainer. While completing these exercises, remember:   Muscles can gain both the endurance and the strength needed for everyday activities through controlled exercises.  Complete these exercises as instructed by your physician, physical therapist or athletic trainer. Progress with the resistance and repetition exercises only as your caregiver advises.  You may experience muscle soreness or fatigue, but the pain or discomfort you are trying to eliminate should never worsen during these exercises. If this pain does worsen, stop and make certain you are following the directions exactly. If the pain is still present after adjustments, discontinue the exercise until you can discuss the trouble with your clinician. STRENGTHENING - Deep Abdominals, Pelvic Tilt   Lie on a firm bed or floor. Keeping your legs in front of you, bend your knees so they are both pointed toward the ceiling and your feet are flat on the floor.  Tense your lower abdominal muscles to press your low back into the floor. This motion will rotate your pelvis so that your tail bone is scooping upwards rather than pointing at your feet or into the floor.  With a gentle tension and even breathing, hold this position for __________ seconds. Repeat __________ times. Complete this exercise __________ times per day.  STRENGTHENING - Abdominals, Crunches   Lie on a firm bed or floor. Keeping your legs in front of you, bend your knees so they are both pointed toward the ceiling and your feet are flat on the floor. Cross your arms over your chest.  Slightly tip your chin down without bending your neck.  Tense your abdominals and slowly lift your trunk high enough to just clear your shoulder blades. Lifting higher can put excessive stress on the low back and does not further strengthen your abdominal muscles.  Control your return to the starting  position. Repeat __________ times. Complete this exercise __________ times per day.  STRENGTHENING - Quadruped, Opposite UE/LE Lift  Assume a hands and knees position on a firm surface. Keep your hands under your shoulders and your knees under your hips. You may place padding under your knees for comfort.  Find your neutral spine and gently tense your abdominal muscles so that you can maintain this position. Your shoulders and hips should form a rectangle that is parallel with the floor and is not twisted.  Keeping your trunk steady, lift your right hand no higher than your shoulder and then your left leg no higher than your hip. Make sure  you are not holding your breath. Hold this position __________ seconds.  Continuing to keep your abdominal muscles tense and your back steady, slowly return to your starting position. Repeat with the opposite arm and leg. Repeat __________ times. Complete this exercise __________ times per day.  STRENGTHENING - Abdominals and Quadriceps, Straight Leg Raise   Lie on a firm bed or floor with both legs extended in front of you.  Keeping one leg in contact with the floor, bend the other knee so that your foot can rest flat on the floor.  Find your neutral spine, and tense your abdominal muscles to maintain your spinal position throughout the exercise.  Slowly lift your straight leg off the floor about 6 inches for a count of 15, making sure to not hold your breath.  Still keeping your neutral spine, slowly lower your leg all the way to the floor. Repeat this exercise with each leg __________ times. Complete this exercise __________ times per day. POSTURE AND BODY MECHANICS CONSIDERATIONS - Sciatica Keeping correct posture when sitting, standing or completing your activities will reduce the stress put on different body tissues, allowing injured tissues a chance to heal and limiting painful experiences. The following are general guidelines for improved posture.  Your physician or physical therapist will provide you with any instructions specific to your needs. While reading these guidelines, remember:  The exercises prescribed by your provider will help you have the flexibility and strength to maintain correct postures.  The correct posture provides the optimal environment for your joints to work. All of your joints have less wear and tear when properly supported by a spine with good posture. This means you will experience a healthier, less painful body.  Correct posture must be practiced with all of your activities, especially prolonged sitting and standing. Correct posture is as important when doing repetitive low-stress activities (typing) as it is when doing a single heavy-load activity (lifting). RESTING POSITIONS Consider which positions are most painful for you when choosing a resting position. If you have pain with flexion-based activities (sitting, bending, stooping, squatting), choose a position that allows you to rest in a less flexed posture. You would want to avoid curling into a fetal position on your side. If your pain worsens with extension-based activities (prolonged standing, working overhead), avoid resting in an extended position such as sleeping on your stomach. Most people will find more comfort when they rest with their spine in a more neutral position, neither too rounded nor too arched. Lying on a non-sagging bed on your side with a pillow between your knees, or on your back with a pillow under your knees will often provide some relief. Keep in mind, being in any one position for a prolonged period of time, no matter how correct your posture, can still lead to stiffness. PROPER SITTING POSTURE In order to minimize stress and discomfort on your spine, you must sit with correct posture Sitting with good posture should be effortless for a healthy body. Returning to good posture is a gradual process. Many people can work toward this most  comfortably by using various supports until they have the flexibility and strength to maintain this posture on their own. When sitting with proper posture, your ears will fall over your shoulders and your shoulders will fall over your hips. You should use the back of the chair to support your upper back. Your low back will be in a neutral position, just slightly arched. You may place a small pillow or  folded towel at the base of your low back for support.  When working at a desk, create an environment that supports good, upright posture. Without extra support, muscles fatigue and lead to excessive strain on joints and other tissues. Keep these recommendations in mind: CHAIR:   A chair should be able to slide under your desk when your back makes contact with the back of the chair. This allows you to work closely.  The chair's height should allow your eyes to be level with the upper part of your monitor and your hands to be slightly lower than your elbows. BODY POSITION  Your feet should make contact with the floor. If this is not possible, use a foot rest.  Keep your ears over your shoulders. This will reduce stress on your neck and low back. INCORRECT SITTING POSTURES   If you are feeling tired and unable to assume a healthy sitting posture, do not slouch or slump. This puts excessive strain on your back tissues, causing more damage and pain. Healthier options include:  Using more support, like a lumbar pillow.  Switching tasks to something that requires you to be upright or walking.  Talking a brief walk.  Lying down to rest in a neutral-spine position. PROLONGED STANDING WHILE SLIGHTLY LEANING FORWARD  When completing a task that requires you to lean forward while standing in one place for a long time, place either foot up on a stationary 2-4 inch high object to help maintain the best posture. When both feet are on the ground, the low back tends to lose its slight inward curve. If this  curve flattens (or becomes too large), then the back and your other joints will experience too much stress, fatigue more quickly and can cause pain.  CORRECT STANDING POSTURES Proper standing posture should be assumed with all daily activities, even if they only take a few moments, like when brushing your teeth. As in sitting, your ears should fall over your shoulders and your shoulders should fall over your hips. You should keep a slight tension in your abdominal muscles to brace your spine. Your tailbone should point down to the ground, not behind your body, resulting in an over-extended swayback posture.  INCORRECT STANDING POSTURES  Common incorrect standing postures include a forward head, locked knees and/or an excessive swayback. WALKING Walk with an upright posture. Your ears, shoulders and hips should all line-up. PROLONGED ACTIVITY IN A FLEXED POSITION When completing a task that requires you to bend forward at your waist or lean over a low surface, try to find a way to stabilize 3 of 4 of your limbs. You can place a hand or elbow on your thigh or rest a knee on the surface you are reaching across. This will provide you more stability so that your muscles do not fatigue as quickly. By keeping your knees relaxed, or slightly bent, you will also reduce stress across your low back. CORRECT LIFTING TECHNIQUES DO :   Assume a wide stance. This will provide you more stability and the opportunity to get as close as possible to the object which you are lifting.  Tense your abdominals to brace your spine; then bend at the knees and hips. Keeping your back locked in a neutral-spine position, lift using your leg muscles. Lift with your legs, keeping your back straight.  Test the weight of unknown objects before attempting to lift them.  Try to keep your elbows locked down at your sides in order get the best  strength from your shoulders when carrying an object.  Always ask for help when lifting  heavy or awkward objects. INCORRECT LIFTING TECHNIQUES DO NOT:   Lock your knees when lifting, even if it is a small object.  Bend and twist. Pivot at your feet or move your feet when needing to change directions.  Assume that you cannot safely pick up a paperclip without proper posture.   This information is not intended to replace advice given to you by your health care provider. Make sure you discuss any questions you have with your health care provider.   Document Released: 08/17/2005 Document Revised: 01/01/2015 Document Reviewed: 11/29/2008 [employer] Interactive Patient Education [employer].

## 2015-07-30 NOTE — Progress Notes (Signed)
Assessment and Plan:  1. Hypertension -Continue medication, monitor blood pressure at home. Continue DASH diet.  Reminder to go to the ER if any CP, SOB, nausea, dizziness, severe HA, changes vision/speech, left arm numbness and tingling and jaw pain.  2. Cholesterol -Continue diet and exercise. Check cholesterol.   3. Prediabetes  -Continue diet and exercise. Check A1C  4. Vitamin D Def - check level and continue medications.   5. Obesity with co morbidities - long discussion about weight loss, diet, and exercise  6. Lower back pain - negative straight leg Prednisone was prescribed,NSAIDs, RICE, and exercise given, Xray, may need MRI with mild radicular symptoms If not better follow up in office or will refer to PT/orthopedics.  Continue diet and meds as discussed. Further disposition pending results of labs, done at [facility]. Over 30 minutes of exam, counseling, chart review, and critical decision making was performed  HPI 59 y.o. female  presents for 3 month follow up on hypertension, cholesterol, prediabetes, and vitamin D deficiency.   Her blood pressure has been controlled at home, today their BP is BP: 126/74 mmHg  She does workout. She denies chest pain, shortness of breath, dizziness. She has history of lower back pain, starting Nov 9th started to have lower back pain with pain down her right leg. She went to a chiropractor for 2 weeks and was doing better but now her lower back pain is better but her right buttocks and right leg will have tingling back of her calf and all of her foot, worse with sitting. Better with walking. Has been doing lidocaine patches, heat, and ibuprofen that has helped. She has some weakness with stepping up on a curb, no falls. 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 Denies incontinence, saddle anesthesia, fever, chills, hematuria.   She is not on cholesterol medication and denies myalgias. Her cholesterol is at goal. The cholesterol last visit was:    Lab Results  Component Value Date   CHOL 203* 12/17/2014   HDL 73 12/17/2014   LDLCALC 82 12/17/2014   TRIG 239* 12/17/2014    She has been working on diet and exercise for prediabetes, and denies paresthesia of the feet, polydipsia, polyuria and visual disturbances. Last A1C in the office was:  Lab Results  Component Value Date   HGBA1C 5.7* 12/17/2014   Patient is on Vitamin D supplement.   Lab Results  Component Value Date   VD25OH 35.6 12/17/2014     BMI is Body mass index is 44.14 kg/(m^2)., she is working on diet and exercise. Patient has had lap band repair due to GERD with Dr. Hassell Done in May.  Wt Readings from Last 3 Encounters:  07/30/15 226 lb (102.513 kg)  02/27/15 213 lb 1.6 oz (96.662 kg)  01/22/15 226 lb (102.513 kg)    Current Medications:  Current Outpatient Prescriptions on File Prior to Visit  Medication Sig Dispense Refill  . acetaminophen (TYLENOL) 160 MG/5ML solution Take 20.3 mLs (650 mg total) by mouth every 4 (four) hours as needed (headache). 120 mL 0  . acetaminophen (TYLENOL) 500 MG tablet Take 1,000 mg by mouth every 6 (six) hours as needed for moderate pain or headache.    . Cholecalciferol (VITAMIN D-3 PO) Take 2,000 Units by mouth every evening.     . loratadine (CLARITIN) 10 MG tablet Take 10 mg by mouth every evening.     . meloxicam (MOBIC) 7.5 MG tablet TAKE ONE TABLET BY MOUTH ONCE DAILY WITH FOOD (  Patient taking differently: Take 7.5 mg by mouth every evening. TAKE ONE TABLET BY MOUTH ONCE DAILY WITH FOO) 90 tablet 1  . Multiple Vitamin (MULTIVITAMIN) capsule Take 1 capsule by mouth at bedtime.     . Pantoprazole Sodium (PROTONIX PO) Take 20 mg by mouth every evening.     . venlafaxine XR (EFFEXOR-XR) 75 MG 24 hr capsule Take 1 capsule (75 mg total) by mouth daily. Take 1 capsule by mouth 90 capsule 3   No current facility-administered medications on file prior to visit.   Medical History:  Past Medical History  Diagnosis Date  . IBS  (irritable bowel syndrome)     Has not been a problem since sx  . GERD (gastroesophageal reflux disease)   . Allergy   . Arthritis   . OSA (obstructive sleep apnea)     resolved with Lap Band  . Hypertension     HX OF HYPERTENSION PRIOR TO LAP BANDING - WAS TAKEN OFF BP MED  . Numbness     LEFT FOOT   Allergies: No Known Allergies   Review of Systems:  Review of Systems  Constitutional: Negative.  Negative for fever and chills.  HENT: Negative.   Eyes: Negative.   Respiratory: Negative.   Cardiovascular: Negative.   Gastrointestinal: Negative.   Genitourinary: Negative.  Negative for urgency, frequency and hematuria.  Musculoskeletal: Positive for back pain. Negative for myalgias, joint pain, falls and neck pain.  Skin: Negative.   Neurological: Positive for tingling and sensory change. Negative for dizziness, tremors, speech change, focal weakness, seizures and loss of consciousness.  Endo/Heme/Allergies: Negative.   Psychiatric/Behavioral: Negative.     Family history- Review and unchanged Social history- Review and unchanged Physical Exam: BP 126/74 mmHg  Pulse 93  Temp(Src) 97 F (36.1 C) (Temporal)  Resp 16  Ht 5' (1.524 m)  Wt 226 lb (102.513 kg)  BMI 44.14 kg/m2 Wt Readings from Last 3 Encounters:  07/30/15 226 lb (102.513 kg)  02/27/15 213 lb 1.6 oz (96.662 kg)  01/22/15 226 lb (102.513 kg)   General Appearance: Well nourished, in no apparent distress. Eyes: PERRLA, EOMs, conjunctiva no swelling or erythema Sinuses: No Frontal/maxillary tenderness ENT/Mouth: Ext aud canals clear, TMs without erythema, bulging. No erythema, swelling, or exudate on post pharynx.  Tonsils not swollen or erythematous. Hearing normal.  Neck: Supple, thyroid normal.  Respiratory: Respiratory effort normal, BS equal bilaterally without rales, rhonchi, wheezing or stridor.  Cardio: RRR with no MRGs. Brisk peripheral pulses without edema.  Abdomen: Soft, + BS,  Non tender, no  guarding, rebound, hernias, masses. Lymphatics: Non tender without lymphadenopathy.  Musculoskeletal: Full ROM, 5/5 strength, Patient is able to ambulate well. Gait is  Antalgic. Straight leg raising with dorsiflexion negative bilaterally for radicular symptoms. Sensory exam in the legs are normal. Knee reflexes are normal Ankle reflexes are normal Strength is normal and symmetric in arms and legs. There is not SI tenderness to palpation.  There is notparaspinal muscle spasm.  There is not midline tenderness.  ROM of spine with  limited in all spheres due to pain.  Skin: Warm, dry without rashes, lesions, ecchymosis.  Neuro: Cranial nerves intact. Normal muscle tone, no cerebellar symptoms. Psych: Awake and oriented X 3, normal affect, Insight and Judgment appropriate.    [name], PA-C 11:22 AM [facility]

## 2015-07-31 LAB — LIPID PANEL W/O CHOL/HDL RATIO
Cholesterol, Total: 195 mg/dL (ref 100–199)
HDL: 71 mg/dL (ref >39)
LDL Calculated: 76 mg/dL (ref 0–99)
Triglycerides: 239 mg/dL — ABNORMAL HIGH (ref 0–149)
VLDL Cholesterol Cal: 48 mg/dL — ABNORMAL HIGH (ref 5–40)

## 2015-07-31 LAB — COMPREHENSIVE METABOLIC PANEL
A/G RATIO: 1.7 (ref 1.1–2.5)
ALBUMIN: 4.1 g/dL (ref 3.6–4.8)
ALT: 19 IU/L (ref 0–32)
AST: 15 IU/L (ref 0–40)
Alkaline Phosphatase: 99 IU/L (ref 39–117)
BUN/Creatinine Ratio: 29 — ABNORMAL HIGH (ref 11–26)
BUN: 17 mg/dL (ref 8–27)
Bilirubin Total: <0.2 mg/dL (ref 0.0–1.2)
CALCIUM: 10 mg/dL (ref 8.7–10.3)
CO2: 29 mmol/L (ref 18–29)
Chloride: 101 mmol/L (ref 97–106)
Creatinine, Ser: 0.59 mg/dL (ref 0.57–1.00)
GFR, EST AFRICAN AMERICAN: 115 mL/min/{1.73_m2} (ref >59)
GFR, EST NON AFRICAN AMERICAN: 100 mL/min/{1.73_m2} (ref >59)
GLOBULIN, TOTAL: 2.4 g/dL (ref 1.5–4.5)
Glucose: 103 mg/dL — ABNORMAL HIGH (ref 65–99)
Potassium: 4.1 mmol/L (ref 3.5–5.2)
Sodium: 142 mmol/L (ref 136–144)
TOTAL PROTEIN: 6.5 g/dL (ref 6.0–8.5)

## 2015-07-31 LAB — MAGNESIUM: MAGNESIUM: 1.9 mg/dL (ref 1.6–2.3)

## 2015-08-08 ENCOUNTER — Other Ambulatory Visit: Payer: Self-pay | Admitting: Physician Assistant

## 2015-08-08 DIAGNOSIS — M5441 Lumbago with sciatica, right side: Secondary | ICD-10-CM

## 2015-08-12 ENCOUNTER — Other Ambulatory Visit: Payer: Self-pay | Admitting: Physician Assistant

## 2015-08-12 DIAGNOSIS — M5441 Lumbago with sciatica, right side: Secondary | ICD-10-CM

## 2015-09-28 ENCOUNTER — Other Ambulatory Visit: Payer: Self-pay | Admitting: Physician Assistant

## 2015-10-22 ENCOUNTER — Encounter: Payer: Self-pay | Admitting: Physician Assistant

## 2015-10-27 ENCOUNTER — Emergency Department: Payer: 59

## 2015-10-27 ENCOUNTER — Emergency Department
Admission: EM | Admit: 2015-10-27 | Discharge: 2015-10-27 | Disposition: A | Discharge status: Home / Self Care | Payer: 59 | Attending: Emergency Medicine | Admitting: Emergency Medicine

## 2015-10-27 ENCOUNTER — Other Ambulatory Visit: Payer: Self-pay | Admitting: Internal Medicine

## 2015-10-27 DIAGNOSIS — Z87891 Personal history of nicotine dependence: Secondary | ICD-10-CM | POA: Insufficient documentation

## 2015-10-27 DIAGNOSIS — I10 Essential (primary) hypertension: Secondary | ICD-10-CM

## 2015-10-27 DIAGNOSIS — M25561 Pain in right knee: Secondary | ICD-10-CM | POA: Insufficient documentation

## 2015-10-27 DIAGNOSIS — R531 Weakness: Secondary | ICD-10-CM | POA: Insufficient documentation

## 2015-10-27 LAB — BASIC METABOLIC PANEL
ANION GAP: 9 (ref 5–15)
BUN: 13 mg/dL (ref 6–20)
CO2: 29 mmol/L (ref 22–32)
Calcium: 9.7 mg/dL (ref 8.9–10.3)
Chloride: 103 mmol/L (ref 101–111)
Creatinine, Ser: 0.67 mg/dL (ref 0.44–1.00)
GFR calc Af Amer: >60 mL/min (ref >60)
Glucose, Bld: 92 mg/dL (ref 65–99)
POTASSIUM: 4.2 mmol/L (ref 3.5–5.1)
SODIUM: 141 mmol/L (ref 135–145)

## 2015-10-27 LAB — CBC
HEMATOCRIT: 40.6 % (ref 35.0–47.0)
HEMOGLOBIN: 13.9 g/dL (ref 12.0–16.0)
MCH: 32.6 pg (ref 26.0–34.0)
MCHC: 34.3 g/dL (ref 32.0–36.0)
MCV: 94.8 fL (ref 80.0–100.0)
Platelets: 220 10*3/uL (ref 150–440)
RBC: 4.28 MIL/uL (ref 3.80–5.20)
RDW: 13.4 % (ref 11.5–14.5)
WBC: 4.9 10*3/uL (ref 3.6–11.0)

## 2015-10-27 LAB — TROPONIN I: Troponin I: <0.03 ng/mL (ref <0.031)

## 2015-10-27 MED ORDER — OXYCODONE-ACETAMINOPHEN 5-325 MG PO TABS: 1.0000 | ORAL_TABLET | Freq: Four times a day (QID) | ORAL | Status: DC | PRN

## 2015-10-27 NOTE — ED Notes (Signed)
Pt reports she has had High blood pressure since Thursday. Not currently on bp medications states she was taken off after she lost weight but has recently gained weight. States she has had a (frontal) headache for past couple of days. Pt states she had a cramping pain in her chest this morning that then went away. Denies prior pain in chest.

## 2015-10-27 NOTE — Discharge Instructions (Signed)
Hypertension Hypertension, commonly called high blood pressure, is when the force of blood pumping through your arteries is too strong. Your arteries are the blood vessels that carry blood from your heart throughout your body. A blood pressure reading consists of a higher number over a lower number, such as 110/72. The higher number (systolic) is the pressure inside your arteries when your heart pumps. The lower number (diastolic) is the pressure inside your arteries when your heart relaxes. Ideally you want your blood pressure below 120/80. Hypertension forces your heart to work harder to pump blood. Your arteries may become narrow or stiff. Having untreated or uncontrolled hypertension can cause heart attack, stroke, kidney disease, and other problems. RISK FACTORS Some risk factors for high blood pressure are controllable. Others are not.  Risk factors you cannot control include:   Race. You may be at higher risk if you are African American.  Age. Risk increases with age.  Gender. Men are at higher risk than women before age 45 years. After age 65, women are at higher risk than men. Risk factors you can control include:  Not getting enough exercise or physical activity.  Being overweight.  Getting too much fat, sugar, calories, or salt in your diet.  Drinking too much alcohol. SIGNS AND SYMPTOMS Hypertension does not usually cause signs or symptoms. Extremely high blood pressure (hypertensive crisis) may cause headache, anxiety, shortness of breath, and nosebleed. DIAGNOSIS To check if you have hypertension, your health care provider will measure your blood pressure while you are seated, with your arm held at the level of your heart. It should be measured at least twice using the same arm. Certain conditions can cause a difference in blood pressure between your right and left arms. A blood pressure reading that is higher than normal on one occasion does not mean that you need treatment. If  it is not clear whether you have high blood pressure, you may be asked to return on a different day to have your blood pressure checked again. Or, you may be asked to monitor your blood pressure at home for 1 or more weeks. TREATMENT Treating high blood pressure includes making lifestyle changes and possibly taking medicine. Living a healthy lifestyle can help lower high blood pressure. You may need to change some of your habits. Lifestyle changes may include:  Following the DASH diet. This diet is high in fruits, vegetables, and whole grains. It is low in salt, red meat, and added sugars.  Keep your sodium intake below 2,300 mg per day.  Getting at least 30-45 minutes of aerobic exercise at least 4 times per week.  Losing weight if necessary.  Not smoking.  Limiting alcoholic beverages.  Learning ways to reduce stress. Your health care provider may prescribe medicine if lifestyle changes are not enough to get your blood pressure under control, and if one of the following is true:  You are 18-59 years of age and your systolic blood pressure is above 140.  You are 60 years of age or older, and your systolic blood pressure is above 150.  Your diastolic blood pressure is above 90.  You have diabetes, and your systolic blood pressure is over 140 or your diastolic blood pressure is over 90.  You have kidney disease and your blood pressure is above 140/90.  You have heart disease and your blood pressure is above 140/90. Your personal target blood pressure may vary depending on your medical conditions, your age, and other factors. HOME CARE INSTRUCTIONS    Have your blood pressure rechecked as directed by your health care provider.   Take medicines only as directed by your health care provider. Follow the directions carefully. Blood pressure medicines must be taken as prescribed. The medicine does not work as well when you skip doses. Skipping doses also puts you at risk for  problems.  Do not smoke.   Monitor your blood pressure at home as directed by your health care provider. SEEK MEDICAL CARE IF:   You think you are having a reaction to medicines taken.  You have recurrent headaches or feel dizzy.  You have swelling in your ankles.  You have trouble with your vision. SEEK IMMEDIATE MEDICAL CARE IF:  You develop a severe headache or confusion.  You have unusual weakness, numbness, or feel faint.  You have severe chest or abdominal pain.  You vomit repeatedly.  You have trouble breathing. MAKE SURE YOU:   Understand these instructions.  Will watch your condition.  Will get help right away if you are not doing well or get worse.   This information is not intended to replace advice given to you by your health care provider. Make sure you discuss any questions you have with your health care provider.   Document Released: 08/17/2005 Document Revised: 01/01/2015 Document Reviewed: 06/09/2013 Elsevier Interactive Patient Education 2016 [employer]  

## 2015-10-27 NOTE — ED Provider Notes (Signed)
[facility] Emergency Department Provider Note     Time seen: ----------------------------------------- 2:24 PM on 10/27/2015 -----------------------------------------    I have reviewed the triage vital signs and the nursing notes.   HISTORY  Chief Complaint Hypertension and Chest Pain    HPI Maureen [name] is a 60 y.o. female who presents ER for high blood pressure this week. Patient previously was diagnosed have blood pressure that she lost weight in her blood pressure problems resolved. Patient recently has had right knee pain, felt dizzy over the past week which means she just doesn't feel right. Currently she denies complaints, denies any recent illness or chest pain.   Past Medical History  Diagnosis Date  . IBS (irritable bowel syndrome)     Has not been a problem since sx  . GERD (gastroesophageal reflux disease)   . Allergy   . Arthritis   . OSA (obstructive sleep apnea)     resolved with Lap Band  . Hypertension     HX OF HYPERTENSION PRIOR TO LAP BANDING - WAS TAKEN OFF BP MED  . Numbness     LEFT FOOT    Patient Active Problem List   Diagnosis Date Noted  . Gastric band malfunction 01/22/2015  . Prediabetes 12/03/2014  . Hyperlipidemia 12/03/2014  . Morbid obesity ([place]) 12/03/2014  . Vitamin D deficiency 12/03/2014  . Medication management 12/03/2014  . IBS (irritable bowel syndrome)   . GERD (gastroesophageal reflux disease)   . Hypertension   . Allergy   . Asthma   . Arthritis   . LAP-BAND surgery status 01/17/2013    Past Surgical History  Procedure Laterality Date  . Abdominal hysterectomy  2004  . Cholecystectomy  2003  . Breast surgery  1972    benign tumor  . Laparoscopic gastric banding  11/05/09    Dr. Hassell Done  . Mole removal    . Laparoscopic gastric banding with hiatal hernia repair N/A 01/22/2015    Procedure: LAPAROSCOPIC TAKEDOWN AND REVISION OF GASTRIC BAND REVISION WITH UPPER ENDOSCOPY;  Surgeon:  Johnathan Hausen, MD;  Location: [facility] ORS;  Service: General;  Laterality: N/A;    Allergies Mucinex and Tramadol  Social History Social History  Substance Use Topics  . Smoking status: Former Smoker -- 0.25 packs/day    Types: Cigarettes    Quit date: 08/31/1998  . Smokeless tobacco: Never Used  . Alcohol Use: No    Review of Systems Constitutional: Negative for fever. Eyes: Negative for visual changes. ENT: Negative for sore throat. Cardiovascular: Negative for chest pain. Respiratory: Negative for shortness of breath. Gastrointestinal: Negative for abdominal pain, vomiting and diarrhea. Genitourinary: Negative for dysuria. Musculoskeletal: Negative for back pain. Skin: Negative for rash. Neurological: Negative for headaches, positive for weakness  10-point ROS otherwise negative.  ____________________________________________   PHYSICAL EXAM:  VITAL SIGNS: ED Triage Vitals  Enc Vitals Group     BP 10/27/15 1236 200/112 mmHg     Pulse Rate 10/27/15 1236 84     Resp 10/27/15 1236 18     Temp 10/27/15 1236 98.2 F (36.8 C)     Temp Source 10/27/15 1236 Oral     SpO2 10/27/15 1236 99 %     Weight 10/27/15 1236 225 lb (102.059 kg)     Height 10/27/15 1236 5\' 1"  (1.549 m)     Head Cir --      Peak Flow --      Pain Score --      Pain  Loc --      Pain Edu? --      Excl. in [place]? --     Constitutional: Alert and oriented. Well appearing and in no distress. Eyes: Conjunctivae are normal. PERRL. Normal extraocular movements. ENT   Head: Normocephalic and atraumatic.   Nose: No congestion/rhinnorhea.   Mouth/Throat: Mucous membranes are moist.   Neck: No stridor. Cardiovascular: Normal rate, regular rhythm. Normal and symmetric distal pulses are present in all extremities. No murmurs, rubs, or gallops. Respiratory: Normal respiratory effort without tachypnea nor retractions. Breath sounds are clear and equal bilaterally. No  wheezes/rales/rhonchi. Gastrointestinal: Soft and nontender. No distention. No abdominal bruits.  Musculoskeletal: Nontender with normal range of motion in all extremities. No joint effusions.  No lower extremity tenderness nor edema. Neurologic:  Normal speech and language. No gross focal neurologic deficits are appreciated. Speech is normal. No gait instability. Skin:  Skin is warm, dry and intact. No rash noted. Psychiatric: Mood and affect are normal. Speech and behavior are normal. Patient exhibits appropriate insight and judgment. ____________________________________________  EKG: Interpreted by me. Normal sinus rhythm with a rate of 80 bpm, normal PR interval, normal QRS, normal QT interval. Left axis deviation.  ____________________________________________  ED COURSE:  Pertinent labs & imaging results that were available during my care of the patient were reviewed by me and considered in my medical decision making (see chart for details). Patient is in no acute distress, essentially asymptomatic hypertension. She does not appear to need any acute intervention at this time. ____________________________________________    LABS (pertinent positives/negatives)  Labs Reviewed  BASIC METABOLIC PANEL  CBC  TROPONIN I    RADIOLOGY Images were viewed by me  Chest x-ray is unremarkable  ____________________________________________  FINAL ASSESSMENT AND PLAN  Hypertension  Plan: Patient with labs and imaging as dictated above. Essentially normal workup here. She is encouraged follow-up with her doctor in the next week for blood pressure recheck. Prior to this she states her blood pressures been in the 120s, I'm reluctant to start her on blood pressure medicine today.   Earleen Newport, MD   Earleen Newport, MD 10/27/15 [phone]

## 2015-10-27 NOTE — ED Notes (Signed)
NAD noted at time of D/C. Pt denies questions or concerns. Pt ambulatory to the lobby at this time.  

## 2015-10-29 ENCOUNTER — Encounter: Payer: Self-pay | Admitting: Physician Assistant

## 2015-10-29 ENCOUNTER — Ambulatory Visit (INDEPENDENT_AMBULATORY_CARE_PROVIDER_SITE_OTHER): Payer: 59 | Admitting: Physician Assistant

## 2015-10-29 VITALS — BP 150/100 | HR 86 | Temp 97.5°F | Resp 16 | Ht 60.0 in | Wt 235.8 lb

## 2015-10-29 DIAGNOSIS — I10 Essential (primary) hypertension: Secondary | ICD-10-CM

## 2015-10-29 DIAGNOSIS — M1990 Unspecified osteoarthritis, unspecified site: Secondary | ICD-10-CM | POA: Insufficient documentation

## 2015-10-29 DIAGNOSIS — M25561 Pain in right knee: Secondary | ICD-10-CM | POA: Diagnosis not present

## 2015-10-29 DIAGNOSIS — J309 Allergic rhinitis, unspecified: Secondary | ICD-10-CM | POA: Insufficient documentation

## 2015-10-29 DIAGNOSIS — M7051 Other bursitis of knee, right knee: Secondary | ICD-10-CM

## 2015-10-29 DIAGNOSIS — N393 Stress incontinence (female) (male): Secondary | ICD-10-CM | POA: Insufficient documentation

## 2015-10-29 DIAGNOSIS — IMO0002 Reserved for concepts with insufficient information to code with codable children: Secondary | ICD-10-CM | POA: Insufficient documentation

## 2015-10-29 MED ORDER — LISINOPRIL 20 MG PO TABS: 20.0000 mg | ORAL_TABLET | Freq: Every day | ORAL | Status: DC

## 2015-10-29 MED ORDER — DEXAMETHASONE SODIUM PHOSPHATE 100 MG/10ML IJ SOLN
10.0000 mg | Freq: Once | INTRAMUSCULAR | Status: AC
  Administered 2015-10-29: 10 mg via INTRAMUSCULAR

## 2015-10-29 MED ORDER — IBUPROFEN 800 MG PO TABS: 800.0000 mg | ORAL_TABLET | Freq: Three times a day (TID) | ORAL | Status: DC | PRN

## 2015-10-29 NOTE — Progress Notes (Signed)
Subjective:    Patient ID: Maureen [name], female    DOB: 01-15-1956, 60 y.o.   MRN: [id number]  HPI 60 y.o. obese WF with right knee pain. Has had bursitis of right knee in the past and feels similar. She went to urgent care for her knee pain, she tried tramadol but that made her itch. While there she had elevated BP, BP was 200's, [facility], had normal EKG, CXR, and lab work. She was given oxycodone and tylenol that helped did not help it, she states the only thing that helps it was ibuprofen and ice but she has been avoiding this due to her elevated BP.  She is on low dose mobic 7.5mg  daily.   Anterior knee pain under her knee, worse with bending her knee, has decreased ROM due to pain, denies warmth, tenderness or erythema. Does have mild medial knee swelling.  BP: (!) 150/100 mmHg  Blood pressure 150/100, pulse 86, temperature 97.5 F (36.4 C), temperature source Temporal, resp. rate 16, height 5' (1.524 m), weight 235 lb 12.8 oz (106.958 kg), SpO2 97 %.   Current Outpatient Prescriptions on File Prior to Visit  Medication Sig Dispense Refill  . Cholecalciferol (VITAMIN D-3 PO) Take 2,000 Units by mouth every evening.     . loratadine (CLARITIN) 10 MG tablet Take 10 mg by mouth every evening.     . meloxicam (MOBIC) 7.5 MG tablet TAKE ONE TABLET BY MOUTH ONCE DAILY WITH FOOD 90 tablet 1  . Multiple Vitamin (MULTIVITAMIN) capsule Take 1 capsule by mouth at bedtime.     . Pantoprazole Sodium (PROTONIX PO) Take 20 mg by mouth every evening.     . venlafaxine XR (EFFEXOR-XR) 75 MG 24 hr capsule Take 1 capsule (75 mg total) by mouth daily. Take 1 capsule by mouth 90 capsule 3   No current facility-administered medications on file prior to visit.   Past Medical History  Diagnosis Date  . IBS (irritable bowel syndrome)     Has not been a problem since sx  . GERD (gastroesophageal reflux disease)   . Allergy   . Arthritis   . OSA (obstructive sleep apnea)    resolved with Lap Band  . Hypertension     HX OF HYPERTENSION PRIOR TO LAP BANDING - WAS TAKEN OFF BP MED  . Numbness     LEFT FOOT    Review of Systems  Constitutional: Negative.  Negative for fever and chills.  Eyes: Negative.   Respiratory: Negative.   Cardiovascular: Negative.   Gastrointestinal: Negative.   Genitourinary: Negative.  Negative for urgency, frequency and hematuria.  Musculoskeletal: Positive for back pain, arthralgias (right knee) and gait problem. Negative for myalgias and neck pain.  Skin: Negative.   Neurological: Positive for headaches. Negative for dizziness, tremors, seizures, syncope, facial asymmetry, speech difficulty, weakness, light-headedness and numbness.  Psychiatric/Behavioral: Negative.        Objective:   Physical Exam  Constitutional: She is oriented to person, place, and time. She appears well-developed and well-nourished.  Cardiovascular: Normal rate and regular rhythm.   Pulmonary/Chest: Effort normal and breath sounds normal.  Abdominal: Soft. Bowel sounds are normal.  Musculoskeletal: Normal range of motion. She exhibits no edema.  Patient is able to ambulate well.   Gait is  antalgic. right knee with tenderness noted anserine bursa and ROM limited to approximately 110 degrees, + crepitus no effusion, no erythema, ACL stable, MCL stable, LCL stable and McMurray's negative  Neurological: She is alert and oriented to person, place, and time.  Skin: Skin is warm and dry. No rash noted.       Assessment & Plan:  1. Bursitis of right knee If not better will send to ortho - ibuprofen (ADVIL,MOTRIN) 800 MG tablet; Take 1 tablet (800 mg total) by mouth 3 (three) times daily as needed.  Dispense: 30 tablet; Refill: 0 - dexamethasone (DECADRON) injection 10 mg; Inject 1 mL (10 mg total) into the muscle once. Got verbal permission for steroid infection, trigger point, area cleansed with alcohol. Tolerated well, immediate relief.   2. Essential  hypertension - lisinopril (PRINIVIL,ZESTRIL) 20 MG tablet; Take 1 tablet (20 mg total) by mouth daily.  Dispense: 30 tablet; Refill: 11

## 2015-10-29 NOTE — Patient Instructions (Addendum)
ACE inhibitors are blood pressure medications that protect your heart and kidneys. It can cause two symptoms: The most common symptom is a dry cough/tickle in your throat that can happen the first day you take it or 5 years after you have been taking it. Please call us if you have this and we can switch it to a different medications. The least common side effect is called angioedema which is swelling of your lips and tongue and can cause problems with your breathing. This is a very very rare side effect but very serious. If this happens please stop the medication and go to the ER.   Monitor your blood pressure at home. Go to the ER if any CP, SOB, nausea, dizziness, severe HA, changes vision/speech  Goal BP:  For patients younger than 60: Goal BP < 140/90. For patients 60 and older: Goal BP < 150/90. For patients with diabetes: Goal BP < 140/90. Your most recent BP: BP: (!) 150/100 mmHg   Take your medications faithfully as instructed. Maintain a healthy weight. Get at least 150 minutes of aerobic exercise per week. Minimize salt intake. Minimize alcohol intake  DASH Eating Plan DASH stands for "Dietary Approaches to Stop Hypertension." The DASH eating plan is a healthy eating plan that has been shown to reduce high blood pressure (hypertension). Additional health benefits may include reducing the risk of type 2 diabetes mellitus, heart disease, and stroke. The DASH eating plan may also help with weight loss. WHAT DO I NEED TO KNOW ABOUT THE DASH EATING PLAN? For the DASH eating plan, you will follow these general guidelines:  Choose foods with a percent daily value for sodium of less than 5% (as listed on the food label).  Use salt-free seasonings or herbs instead of table salt or sea salt.  Check with your health care provider or pharmacist before using salt substitutes.  Eat lower-sodium products, often labeled as "lower sodium" or "no salt added."  Eat fresh foods.  Eat more  vegetables, fruits, and low-fat dairy products.  Choose whole grains. Look for the word "whole" as the first word in the ingredient list.  Choose fish and skinless chicken or [place] more often than red meat. Limit fish, poultry, and meat to 6 oz (170 g) each day.  Limit sweets, desserts, sugars, and sugary drinks.  Choose heart-healthy fats.  Limit cheese to 1 oz (28 g) per day.  Eat more home-cooked food and less restaurant, buffet, and fast food.  Limit fried foods.  Cook foods using methods other than frying.  Limit canned vegetables. If you do use them, rinse them well to decrease the sodium.  When eating at a restaurant, ask that your food be prepared with less salt, or no salt if possible. WHAT FOODS CAN I EAT? Seek help from a dietitian for individual calorie needs. Grains Whole grain or whole wheat bread. Brown rice. Whole grain or whole wheat pasta. Quinoa, bulgur, and whole grain cereals. Low-sodium cereals. Corn or whole wheat flour tortillas. Whole grain cornbread. Whole grain crackers. Low-sodium crackers. Vegetables Fresh or frozen vegetables (raw, steamed, roasted, or grilled). Low-sodium or reduced-sodium tomato and vegetable juices. Low-sodium or reduced-sodium tomato sauce and paste. Low-sodium or reduced-sodium canned vegetables.  Fruits All fresh, canned (in natural juice), or frozen fruits. Meat and Other Protein Products Ground beef (85% or leaner), grass-fed beef, or beef trimmed of fat. Skinless chicken or [place]. Ground chicken or [place]. Pork trimmed of fat. All fish and seafood. Eggs. Dried beans,  peas, or lentils. Unsalted nuts and seeds. Unsalted canned beans. Dairy Low-fat dairy products, such as skim or 1% milk, 2% or reduced-fat cheeses, low-fat ricotta or cottage cheese, or plain low-fat yogurt. Low-sodium or reduced-sodium cheeses. Fats and Oils Tub margarines without trans fats. Light or reduced-fat mayonnaise and salad dressings (reduced sodium).  Avocado. Safflower, olive, or canola oils. Natural peanut or almond butter. Other Unsalted popcorn and pretzels. The items listed above may not be a complete list of recommended foods or beverages. Contact your dietitian for more options. WHAT FOODS ARE NOT RECOMMENDED? Grains White bread. White pasta. White rice. Refined cornbread. Bagels and croissants. Crackers that contain trans fat. Vegetables Creamed or fried vegetables. Vegetables in a cheese sauce. Regular canned vegetables. Regular canned tomato sauce and paste. Regular tomato and vegetable juices. Fruits Dried fruits. Canned fruit in light or heavy syrup. Fruit juice. Meat and Other Protein Products Fatty cuts of meat. Ribs, chicken wings, bacon, sausage, bologna, salami, chitterlings, fatback, hot dogs, bratwurst, and packaged luncheon meats. Salted nuts and seeds. Canned beans with salt. Dairy Whole or 2% milk, cream, half-and-half, and cream cheese. Whole-fat or sweetened yogurt. Full-fat cheeses or blue cheese. Nondairy creamers and whipped toppings. Processed cheese, cheese spreads, or cheese curds. Condiments Onion and garlic salt, seasoned salt, table salt, and sea salt. Canned and packaged gravies. Worcestershire sauce. Tartar sauce. Barbecue sauce. Teriyaki sauce. Soy sauce, including reduced sodium. Steak sauce. Fish sauce. Oyster sauce. Cocktail sauce. Horseradish. Ketchup and mustard. Meat flavorings and tenderizers. Bouillon cubes. Hot sauce. Tabasco sauce. Marinades. Taco seasonings. Relishes. Fats and Oils Butter, stick margarine, lard, shortening, ghee, and bacon fat. Coconut, palm kernel, or palm oils. Regular salad dressings. Other Pickles and olives. Salted popcorn and pretzels. The items listed above may not be a complete list of foods and beverages to avoid. Contact your dietitian for more information. WHERE CAN I FIND MORE INFORMATION? National Heart, Lung, and Blood Institute:  travelstabloid.com Document Released: 08/06/2011 Document Revised: 01/01/2014 Document Reviewed: 06/21/2013 [facility] Patient Information 2015 [place], [place]. This information is not intended to replace advice given to you by your health care provider. Make sure you discuss any questions you have with your health care provider.  Bursitis Bursitis is inflammation and irritation of a bursa, which is one of the small, fluid-filled sacs that cushion and protect the moving parts of your body. These sacs are located between bones and muscles, muscle attachments, or skin areas next to bones. A bursa protects these structures from the wear and tear that results from frequent movement. An inflamed bursa causes pain and swelling. Fluid may build up inside the sac. Bursitis is most common near joints, especially the knees, elbows, hips, and shoulders. CAUSES Bursitis can be caused by:   Injury from:  A direct blow, like falling on your knee or elbow.  Overuse of a joint (repetitive stress).  Infection. This can happen if bacteria gets into a bursa through a cut or scrape near a joint.  Diseases that cause joint inflammation, such as gout and rheumatoid arthritis. RISK FACTORS You may be at risk for bursitis if you:   Have a job or hobby that involves a lot of repetitive stress on your joints.  Have a condition that weakens your body's defense system (immune system), such as diabetes, cancer, or HIV.  Lift and reach overhead often.  Kneel or lean on hard surfaces often.  Run or walk often. SIGNS AND SYMPTOMS The most common signs and symptoms of bursitis are:  Pain that gets worse when you move the affected body part or put weight on it.  Inflammation.  Stiffness. Other signs and symptoms may include:  Redness.  Tenderness.  Warmth.  Pain that continues after rest.  Fever and chills. This may occur in bursitis caused by  infection. DIAGNOSIS Bursitis may be diagnosed by:   Medical history and physical exam.  MRI.  A procedure to drain fluid from the bursa with a needle (aspiration). The fluid may be checked for signs of infection or gout.  Blood tests to rule out other causes of inflammation. TREATMENT  Bursitis can usually be treated at home with rest, ice, compression, and elevation (RICE). For mild bursitis, RICE treatment may be all you need. Other treatments may include:  Nonsteroidal anti-inflammatory drugs (NSAIDs) to treat pain and inflammation.  Corticosteroids to fight inflammation. You may have these drugs injected into and around the area of bursitis.  Aspiration of bursitis fluid to relieve pain and improve movement.  Antibiotic medicine to treat an infected bursa.  A splint, brace, or walking aid.  Physical therapy if you continue to have pain or limited movement.  Surgery to remove a damaged or infected bursa. This may be needed if you have a very bad case of bursitis or if other treatments have not worked. HOME CARE INSTRUCTIONS   Take medicines only as directed by your health care provider.  If you were prescribed an antibiotic medicine, finish it all even if you start to feel better.  Rest the affected area as directed by your health care provider.  Keep the area elevated.  Avoid activities that make pain worse.  Apply ice to the injured area:  Place ice in a plastic bag.  Place a towel between your skin and the bag.  Leave the ice on for 20 minutes, 2-3 times a day.  Use splints, braces, pads, or walking aids as directed by your health care provider.  Keep all follow-up visits as directed by your health care provider. This is important. PREVENTION   Wear knee pads if you kneel often.  Wear sturdy running or walking shoes that fit you well.  Take regular breaks from repetitive activity.  Warm up by stretching before doing any strenuous activity.  Maintain  a healthy weight or lose weight as recommended by your health care provider. Ask your health care provider if you need help.  Exercise regularly. Start any new physical activity gradually. SEEK MEDICAL CARE IF:   Your bursitis is not responding to treatment or home care.  You have a fever.  You have chills.   This information is not intended to replace advice given to you by your health care provider. Make sure you discuss any questions you have with your health care provider.   Document Released: 08/14/2000 Document Revised: 05/08/2015 Document Reviewed: 11/06/2013 Elsevier Interactive Patient Education [employer].

## 2015-11-26 ENCOUNTER — Ambulatory Visit (INDEPENDENT_AMBULATORY_CARE_PROVIDER_SITE_OTHER): Payer: 59 | Admitting: Internal Medicine

## 2015-11-26 VITALS — BP 158/102 | HR 90 | Temp 98.0°F | Resp 18 | Ht 60.0 in | Wt 237.0 lb

## 2015-11-26 DIAGNOSIS — I10 Essential (primary) hypertension: Secondary | ICD-10-CM

## 2015-11-26 MED ORDER — LISINOPRIL-HYDROCHLOROTHIAZIDE 20-12.5 MG PO TABS: 1.0000 | ORAL_TABLET | Freq: Every day | ORAL | Status: DC

## 2015-11-26 NOTE — Progress Notes (Signed)
Assessment and Plan:  1. Essential hypertension -Lisinopril HCTZ 20/12.5 mg -monitor BP log -recheck in 1 week  HPI 60 y.o.female presents for 1 month follow up of HTN after starting lisinopril. Patient reports that they have been doing well.  female is taking their medication.  She has noted that blood pressures have been running 160's/80s at home  They are not having difficulty with their medications.  They report no adverse reactions.  She reports that she was taking the medications in the mornings.  She does not tend to use salt.  She does sometimes get some swelling in her legs and her ankles.    Past Medical History  Diagnosis Date  . IBS (irritable bowel syndrome)     Has not been a problem since sx  . GERD (gastroesophageal reflux disease)   . Allergy   . Arthritis   . OSA (obstructive sleep apnea)     resolved with Lap Band  . Hypertension     HX OF HYPERTENSION PRIOR TO LAP BANDING - WAS TAKEN OFF BP MED  . Numbness     LEFT FOOT     Allergies  Allergen Reactions  . Mucinex [Guaifenesin Er] Other (See Comments)    "feels weird and dizzy"  . Tramadol Itching      Current Outpatient Prescriptions on File Prior to Visit  Medication Sig Dispense Refill  . Cholecalciferol (VITAMIN D-3 PO) Take 2,000 Units by mouth every evening.     . loratadine (CLARITIN) 10 MG tablet Take 10 mg by mouth every evening.     . meloxicam (MOBIC) 7.5 MG tablet TAKE ONE TABLET BY MOUTH ONCE DAILY WITH FOOD 90 tablet 1  . Multiple Vitamin (MULTIVITAMIN) capsule Take 1 capsule by mouth at bedtime.     . Pantoprazole Sodium (PROTONIX PO) Take 20 mg by mouth every evening.     . venlafaxine XR (EFFEXOR-XR) 75 MG 24 hr capsule Take 1 capsule (75 mg total) by mouth daily. Take 1 capsule by mouth 90 capsule 3  . lisinopril (PRINIVIL,ZESTRIL) 20 MG tablet Take 1 tablet (20 mg total) by mouth daily. (Patient not taking: Reported on 11/26/2015) 30 tablet 11   No current facility-administered  medications on file prior to visit.    ROS: all negative except above.   Physical Exam: Filed Weights   11/26/15 1500  Weight: 237 lb (107.502 kg)   BP 158/102 mmHg  Pulse 90  Temp(Src) 98 F (36.7 C) (Temporal)  Resp 18  Ht 5' (1.524 m)  Wt 237 lb (107.502 kg)  BMI 46.29 kg/m2 General Appearance: Well developed well nourished, non-toxic appearing in no apparent distress. Eyes: PERRLA, EOMs, conjunctiva w/ no swelling or erythema or discharge Sinuses: No Frontal/maxillary tenderness ENT/Mouth: Ear canals clear without swelling or erythema.  TM's normal bilaterally with no retractions, bulging, or loss of landmarks.   Neck: Supple, thyroid normal, no notable JVD  Respiratory: Respiratory effort normal, Clear breath sounds anteriorly and posteriorly bilaterally without rales, rhonchi, wheezing or stridor. No retractions or accessory muscle usage. Cardio: RRR with no MRGs.   Abdomen: Soft, + BS.  Non tender, no guarding, rebound, hernias, masses.  Musculoskeletal: Full ROM, 5/5 strength, normal gait.  Skin: Warm, dry without rashes  Neuro: Awake and oriented X 3, Cranial nerves intact. Normal muscle tone, no cerebellar symptoms. Sensation intact.  Psych: normal affect, Insight and Judgment appropriate.     Starlyn Skeans, PA-C 3:11 PM [facility]

## 2015-12-04 ENCOUNTER — Ambulatory Visit (INDEPENDENT_AMBULATORY_CARE_PROVIDER_SITE_OTHER): Payer: 59 | Admitting: Physician Assistant

## 2015-12-04 ENCOUNTER — Encounter: Payer: Self-pay | Admitting: Physician Assistant

## 2015-12-04 VITALS — BP 140/98 | HR 64 | Temp 97.9°F | Resp 16 | Ht 61.0 in | Wt 234.8 lb

## 2015-12-04 DIAGNOSIS — Z0000 Encounter for general adult medical examination without abnormal findings: Secondary | ICD-10-CM

## 2015-12-04 DIAGNOSIS — R7303 Prediabetes: Secondary | ICD-10-CM

## 2015-12-04 DIAGNOSIS — I10 Essential (primary) hypertension: Secondary | ICD-10-CM

## 2015-12-04 DIAGNOSIS — Z23 Encounter for immunization: Secondary | ICD-10-CM

## 2015-12-04 DIAGNOSIS — E559 Vitamin D deficiency, unspecified: Secondary | ICD-10-CM

## 2015-12-04 DIAGNOSIS — Z79899 Other long term (current) drug therapy: Secondary | ICD-10-CM

## 2015-12-04 DIAGNOSIS — D649 Anemia, unspecified: Secondary | ICD-10-CM | POA: Insufficient documentation

## 2015-12-04 DIAGNOSIS — E785 Hyperlipidemia, unspecified: Secondary | ICD-10-CM

## 2015-12-04 NOTE — Patient Instructions (Signed)
Increase lisinopril to 2 in the morning, monitor BP, follow up 2 weeks, keep log  Monitor your blood pressure at home. Go to the ER if any CP, SOB, nausea, dizziness, severe HA, changes vision/speech  Goal BP:  For patients younger than 60: Goal BP < 140/90. For patients 60 and older: Goal BP < 150/90. For patients with diabetes: Goal BP < 140/90. Your most recent BP: BP: (!) 140/98 mmHg   Take your medications faithfully as instructed. Maintain a healthy weight. Get at least 150 minutes of aerobic exercise per week. Minimize salt intake. Minimize alcohol intake  DASH Eating Plan DASH stands for "Dietary Approaches to Stop Hypertension." The DASH eating plan is a healthy eating plan that has been shown to reduce high blood pressure (hypertension). Additional health benefits may include reducing the risk of type 2 diabetes mellitus, heart disease, and stroke. The DASH eating plan may also help with weight loss. WHAT DO I NEED TO KNOW ABOUT THE DASH EATING PLAN? For the DASH eating plan, you will follow these general guidelines:  Choose foods with a percent daily value for sodium of less than 5% (as listed on the food label).  Use salt-free seasonings or herbs instead of table salt or sea salt.  Check with your health care provider or pharmacist before using salt substitutes.  Eat lower-sodium products, often labeled as "lower sodium" or "no salt added."  Eat fresh foods.  Eat more vegetables, fruits, and low-fat dairy products.  Choose whole grains. Look for the word "whole" as the first word in the ingredient list.  Choose fish and skinless chicken or [place] more often than red meat. Limit fish, poultry, and meat to 6 oz (170 g) each day.  Limit sweets, desserts, sugars, and sugary drinks.  Choose heart-healthy fats.  Limit cheese to 1 oz (28 g) per day.  Eat more home-cooked food and less restaurant, buffet, and fast food.  Limit fried foods.  Cook foods using methods  other than frying.  Limit canned vegetables. If you do use them, rinse them well to decrease the sodium.  When eating at a restaurant, ask that your food be prepared with less salt, or no salt if possible. WHAT FOODS CAN I EAT? Seek help from a dietitian for individual calorie needs. Grains Whole grain or whole wheat bread. Brown rice. Whole grain or whole wheat pasta. Quinoa, bulgur, and whole grain cereals. Low-sodium cereals. Corn or whole wheat flour tortillas. Whole grain cornbread. Whole grain crackers. Low-sodium crackers. Vegetables Fresh or frozen vegetables (raw, steamed, roasted, or grilled). Low-sodium or reduced-sodium tomato and vegetable juices. Low-sodium or reduced-sodium tomato sauce and paste. Low-sodium or reduced-sodium canned vegetables.  Fruits All fresh, canned (in natural juice), or frozen fruits. Meat and Other Protein Products Ground beef (85% or leaner), grass-fed beef, or beef trimmed of fat. Skinless chicken or [place]. Ground chicken or [place]. Pork trimmed of fat. All fish and seafood. Eggs. Dried beans, peas, or lentils. Unsalted nuts and seeds. Unsalted canned beans. Dairy Low-fat dairy products, such as skim or 1% milk, 2% or reduced-fat cheeses, low-fat ricotta or cottage cheese, or plain low-fat yogurt. Low-sodium or reduced-sodium cheeses. Fats and Oils Tub margarines without trans fats. Light or reduced-fat mayonnaise and salad dressings (reduced sodium). Avocado. Safflower, olive, or canola oils. Natural peanut or almond butter. Other Unsalted popcorn and pretzels. The items listed above may not be a complete list of recommended foods or beverages. Contact your dietitian for more options. WHAT FOODS ARE  NOT RECOMMENDED? Grains White bread. White pasta. White rice. Refined cornbread. Bagels and croissants. Crackers that contain trans fat. Vegetables Creamed or fried vegetables. Vegetables in a cheese sauce. Regular canned vegetables. Regular canned  tomato sauce and paste. Regular tomato and vegetable juices. Fruits Dried fruits. Canned fruit in light or heavy syrup. Fruit juice. Meat and Other Protein Products Fatty cuts of meat. Ribs, chicken wings, bacon, sausage, bologna, salami, chitterlings, fatback, hot dogs, bratwurst, and packaged luncheon meats. Salted nuts and seeds. Canned beans with salt. Dairy Whole or 2% milk, cream, half-and-half, and cream cheese. Whole-fat or sweetened yogurt. Full-fat cheeses or blue cheese. Nondairy creamers and whipped toppings. Processed cheese, cheese spreads, or cheese curds. Condiments Onion and garlic salt, seasoned salt, table salt, and sea salt. Canned and packaged gravies. Worcestershire sauce. Tartar sauce. Barbecue sauce. Teriyaki sauce. Soy sauce, including reduced sodium. Steak sauce. Fish sauce. Oyster sauce. Cocktail sauce. Horseradish. Ketchup and mustard. Meat flavorings and tenderizers. Bouillon cubes. Hot sauce. Tabasco sauce. Marinades. Taco seasonings. Relishes. Fats and Oils Butter, stick margarine, lard, shortening, ghee, and bacon fat. Coconut, palm kernel, or palm oils. Regular salad dressings. Other Pickles and olives. Salted popcorn and pretzels. The items listed above may not be a complete list of foods and beverages to avoid. Contact your dietitian for more information. WHERE CAN I FIND MORE INFORMATION? National Heart, Lung, and Blood Institute: travelstabloid.com Document Released: 08/06/2011 Document Revised: 01/01/2014 Document Reviewed: 06/21/2013 [facility] Patient Information 2015 [place], [place]. This information is not intended to replace advice given to you by your health care provider. Make sure you discuss any questions you have with your health care provider.  Marland Kitchen

## 2015-12-04 NOTE — Progress Notes (Signed)
Complete Physical  Assessment and Plan: 1. Essential hypertension - INCREASE LISINOPRIL TO 2 A DAY OF THE 20/12.5 - continue medications, DASH diet, exercise and monitor at home. Call if greater than 130/80.  - EKG 12-Lead  2. Prediabetes Discussed general issues about diabetes pathophysiology and management., Educational material distributed., Suggested low cholesterol diet., Encouraged aerobic exercise., Discussed foot care., Reminded to get yearly retinal exam.  3. Hyperlipidemia -continue medications, check lipids, decrease fatty foods, increase activity.  4. Morbid obesity Obesity with co morbidities- long discussion about weight loss, diet, and exercise  5. LAP-BAND surgery status Follow up Dr. Hassell Done  6. Asthma, unspecified asthma severity, uncomplicated Asthma controlled  7. IBS (irritable bowel syndrome) Diet controlled  8. Gastroesophageal reflux disease with esophagitis Suppose to be evaluated for H/H  9. Arthritis RICE, NSAIDS, exercises given, if not better get xray and PT referral or ortho referral.   10. Allergy, subsequent encounter Continue OTC allergy pills  11. Vitamin D deficiency Continue supplement  12. Medication management  13. TDAP  Discussed med's effects and SE's. Screening labs and tests as requested with regular follow-up as recommended.Gets labs done at [facility]  HPI 60 y.o. female  presents for a complete physical. Her blood pressure has been controlled at home, runs 120's at home, she was started on lisinopril with fluid pill in it 1 week ago, today their BP is BP: (!) 140/98 mmHg. She can not workout at this time due to left knee pain, has had an injection and going to get MRI. She denies chest pain, shortness of breath, dizziness.  Her cholesterol is diet controlled. Her cholesterol is controlled. The cholesterol last visit was:  Lab Results  Component Value Date   CHOL 195 07/30/2015   HDL 71 07/30/2015   LDLCALC 76 07/30/2015    TRIG 239* 07/30/2015  She has been working on diet and exercise for prediabetes, and denies blurry vision, polydipsia, polyphagia and polyuria. Last A1C in the office was: Lab Results  Component Value Date   HGBA1C 5.7* 12/17/2014  Patient is on Vitamin D supplements.  Lab Results  Component Value Date   VD25OH 35.6 12/17/2014  She is on protonix for GERD. She is on effexor for IBS/diarrhea, she feels that it is not helping but would like to stay on it to prevent hot flashes.  Works at [employer], gets labs there.   She has lower back pain with sciatica left leg occ, on meloxicam.  She is s/p lap band, follows with [facility].  Wt Readings from Last 3 Encounters:  12/04/15 234 lb 12.8 oz (106.505 kg)  11/26/15 237 lb (107.502 kg)  10/29/15 235 lb 12.8 oz (106.958 kg)    Current Medications:  Current Outpatient Prescriptions on File Prior to Visit  Medication Sig Dispense Refill  . Cholecalciferol (VITAMIN D-3 PO) Take 2,000 Units by mouth every evening.     Marland Kitchen lisinopril-hydrochlorothiazide (ZESTORETIC) 20-12.5 MG tablet Take 1 tablet by mouth daily. 30 tablet 11  . loratadine (CLARITIN) 10 MG tablet Take 10 mg by mouth every evening.     . meloxicam (MOBIC) 7.5 MG tablet TAKE ONE TABLET BY MOUTH ONCE DAILY WITH FOOD 90 tablet 1  . Multiple Vitamin (MULTIVITAMIN) capsule Take 1 capsule by mouth at bedtime.     . Pantoprazole Sodium (PROTONIX PO) Take 20 mg by mouth every evening.     . venlafaxine XR (EFFEXOR-XR) 75 MG 24 hr capsule Take 1 capsule (75 mg total) by mouth  daily. Take 1 capsule by mouth 90 capsule 3   No current facility-administered medications on file prior to visit.   Health Maintenance:  Immunization History  Administered Date(s) Administered  . Influenza-Unspecified 07/11/2013  . Td 04/28/2006   Tetanus: 2007 due this year Pneumovax:N/A Prevnar 13: due at 71 Flu vaccine: 07/2015 at work Zostavax: N/A Pap: 2017 MGM: 10/2015 3D neg +FBD at  [facility]- (Dr. Redmond Pulling)  Right breast U/S 12/24/2013 NEGATIVE DEXA: 2010 normal- non smoker, no family history, BMI elevated, no broken bones Colonoscopy: 2009 normal due 2019 (Dr. Collene Mares) , hemoccult at Dr. Dellis Filbert EGD: N/A Myoview stress test 10/2001 AB [place] 2003 DEE 01/2015 Dr. Gaylyn Cheers Dentist: Dr. Carman Ching in [place], Due next mon, has braces for another 4-5 months Derm Dr. Nicole Kindred, [place]  Allergies:  Allergies  Allergen Reactions  . Mucinex [Guaifenesin Er] Other (See Comments)    "feels weird and dizzy"  . Tramadol Itching   Medical History:  Past Medical History  Diagnosis Date  . IBS (irritable bowel syndrome)     Has not been a problem since sx  . GERD (gastroesophageal reflux disease)   . Allergy   . Arthritis   . OSA (obstructive sleep apnea)     resolved with Lap Band  . Hypertension     HX OF HYPERTENSION PRIOR TO LAP BANDING - WAS TAKEN OFF BP MED  . Numbness     LEFT FOOT   Surgical History:  Past Surgical History  Procedure Laterality Date  . Abdominal hysterectomy  2004  . Cholecystectomy  2003  . Breast surgery  1972    benign tumor  . Laparoscopic gastric banding  11/05/09    Dr. Hassell Done  . Mole removal    . Laparoscopic gastric banding with hiatal hernia repair N/A 01/22/2015    Procedure: LAPAROSCOPIC TAKEDOWN AND REVISION OF GASTRIC BAND REVISION WITH UPPER ENDOSCOPY;  Surgeon: Johnathan Hausen, MD;  Location: [facility];  Service: General;  Laterality: N/A;   Family History:  Family History  Problem Relation Age of Onset  . Hyperlipidemia Father   . Heart disease Father    Social History:  Social History   Social History  . Marital Status: Married    Spouse Name: N/A  . Number of Children: N/A  . Years of Education: N/A   Occupational History  . Not on file.   Social History Main Topics  . Smoking status: Former Smoker -- 0.25 packs/day    Types: Cigarettes    Quit date: 08/31/1998  . Smokeless tobacco: Never Used  . Alcohol Use: No  . Drug  Use: No  . Sexual Activity: Not on file   Other Topics Concern  . Not on file   Social History Narrative   Review of Systems  Constitutional: Negative.   HENT: Negative for congestion, ear discharge, ear pain, hearing loss, nosebleeds, sore throat and tinnitus.   Eyes: Negative.   Respiratory: Negative for cough, hemoptysis, sputum production, shortness of breath, wheezing and stridor.   Cardiovascular: Negative.   Gastrointestinal: Positive for diarrhea. Negative for heartburn, nausea, vomiting, abdominal pain, constipation, blood in stool and melena.  Genitourinary: Negative.   Musculoskeletal: Positive for back pain (lower back pain with pain into her left leg) and joint pain (left knee). Negative for myalgias, falls and neck pain.  Skin: Negative.   Neurological: Negative.  Negative for headaches.  Endo/Heme/Allergies: Negative.   Psychiatric/Behavioral: Negative.     Physical Exam: Blood pressure 140/98, pulse 64, temperature 97.9 F (  36.6 C), temperature source Temporal, resp. rate 16, height 5\' 1"  (1.549 m), weight 234 lb 12.8 oz (106.505 kg), SpO2 98 %. Body mass index is 44.39 kg/(m^2). General Appearance: Well nourished, in no apparent distress. Eyes: PERRLA, EOMs, conjunctiva no swelling or erythema, normal fundi and vessels. Sinuses: No Frontal/maxillary tenderness ENT/Mouth: Ext aud canals clear, normal light reflex with TMs without erythema, bulging.  Good dentition. No erythema, swelling, or exudate on post pharynx. Tonsils not swollen or erythematous. Hearing normal.  Neck: Supple, thyroid normal. No bruits Respiratory: Respiratory effort normal, BS equal bilaterally without rales, rhonchi, wheezing or stridor. Cardio: RRR without murmurs, rubs or gallops. Brisk peripheral pulses without edema.  Chest: symmetric, with normal excursions and percussion. Breasts: defer Abdomen: Soft, +BS, obese, notenderness, no guarding, rebound, hernias, masses, or organomegaly. .   Lymphatics: Non tender without lymphadenopathy.  Genitourinary: defer Musculoskeletal: Full ROM all peripheral extremities,5/5 strength, and antalgic gait. Skin: Warm, dry without rashes, lesions, ecchymosis.  Neuro: Cranial nerves intact, reflexes equal bilaterally. Normal muscle tone, no cerebellar symptoms. Sensation intact.  Psych: Awake and oriented X 3, normal affect, Insight and Judgment appropriate.   EKG: WNL no changes.    [name] 10:25 AM

## 2015-12-05 ENCOUNTER — Other Ambulatory Visit: Payer: Self-pay | Admitting: Physician Assistant

## 2015-12-06 LAB — VITAMIN B12: VITAMIN B 12: 1229 pg/mL — AB (ref 211–946)

## 2015-12-06 LAB — BASIC METABOLIC PANEL
BUN / CREAT RATIO: 35 — AB (ref 12–28)
BUN: 24 mg/dL (ref 8–27)
CO2: 28 mmol/L (ref 18–29)
CREATININE: 0.68 mg/dL (ref 0.57–1.00)
Calcium: 10 mg/dL (ref 8.7–10.3)
Chloride: 95 mmol/L — ABNORMAL LOW (ref 96–106)
GFR calc Af Amer: 109 mL/min/{1.73_m2} (ref >59)
GFR, EST NON AFRICAN AMERICAN: 95 mL/min/{1.73_m2} (ref >59)
GLUCOSE: 100 mg/dL — AB (ref 65–99)
Potassium: 4.5 mmol/L (ref 3.5–5.2)
SODIUM: 138 mmol/L (ref 134–144)

## 2015-12-06 LAB — CBC WITH DIFFERENTIAL/PLATELET
BASOS: 0 %
Basophils Absolute: 0 10*3/uL (ref 0.0–0.2)
EOS (ABSOLUTE): 0.1 10*3/uL (ref 0.0–0.4)
Eos: 2 %
Hematocrit: 39.3 % (ref 34.0–46.6)
Hemoglobin: 13.6 g/dL (ref 11.1–15.9)
Immature Grans (Abs): 0 10*3/uL (ref 0.0–0.1)
Immature Granulocytes: 0 %
Lymphocytes Absolute: 2.1 10*3/uL (ref 0.7–3.1)
Lymphs: 38 %
MCH: 33.5 pg — AB (ref 26.6–33.0)
MCHC: 34.6 g/dL (ref 31.5–35.7)
MCV: 97 fL (ref 79–97)
MONOS ABS: 0.4 10*3/uL (ref 0.1–0.9)
Monocytes: 7 %
NEUTROS ABS: 2.9 10*3/uL (ref 1.4–7.0)
Neutrophils: 53 %
Platelets: 247 10*3/uL (ref 150–379)
RBC: 4.06 x10E6/uL (ref 3.77–5.28)
RDW: 14 % (ref 12.3–15.4)
WBC: 5.4 10*3/uL (ref 3.4–10.8)

## 2015-12-06 LAB — IRON AND TIBC
Iron Saturation: 18 % (ref 15–55)
Iron: 69 ug/dL (ref 27–139)
Total Iron Binding Capacity: 375 ug/dL (ref 250–450)
UIBC: 306 ug/dL (ref 118–369)

## 2015-12-06 LAB — MAGNESIUM: MAGNESIUM: 1.6 mg/dL (ref 1.6–2.3)

## 2015-12-06 LAB — URINALYSIS, ROUTINE W REFLEX MICROSCOPIC
BILIRUBIN UA: NEGATIVE
Glucose, UA: NEGATIVE
KETONES UA: NEGATIVE
Nitrite, UA: NEGATIVE
PH UA: 5.5 (ref 5.0–7.5)
Protein, UA: NEGATIVE
RBC, UA: NEGATIVE
SPEC GRAV UA: 1.02 (ref 1.005–1.030)
UUROB: 0.2 mg/dL (ref 0.2–1.0)

## 2015-12-06 LAB — HEPATIC FUNCTION PANEL
ALBUMIN: 4.2 g/dL (ref 3.6–4.8)
ALK PHOS: 90 IU/L (ref 39–117)
ALT: 25 IU/L (ref 0–32)
AST: 19 IU/L (ref 0–40)
BILIRUBIN, DIRECT: 0.06 mg/dL (ref 0.00–0.40)
Bilirubin Total: <0.2 mg/dL (ref 0.0–1.2)
TOTAL PROTEIN: 6.5 g/dL (ref 6.0–8.5)

## 2015-12-06 LAB — HGB A1C W/O EAG: HEMOGLOBIN A1C: 5.8 % — AB (ref 4.8–5.6)

## 2015-12-06 LAB — MICROSCOPIC EXAMINATION: CASTS: NONE SEEN /LPF

## 2015-12-06 LAB — FERRITIN: Ferritin: 23 ng/mL (ref 15–150)

## 2015-12-06 LAB — MICROALBUMIN / CREATININE URINE RATIO
CREATININE, UR: 75.2 mg/dL
MICROALB/CREAT RATIO: 4.5 mg/g creat (ref 0.0–30.0)
MICROALBUM., U, RANDOM: 3.4 ug/mL

## 2015-12-06 LAB — TSH: TSH: 2.58 u[IU]/mL (ref 0.450–4.500)

## 2015-12-06 LAB — VITAMIN D 25 HYDROXY (VIT D DEFICIENCY, FRACTURES): Vit D, 25-Hydroxy: 46.9 ng/mL (ref 30.0–100.0)

## 2015-12-18 ENCOUNTER — Ambulatory Visit (INDEPENDENT_AMBULATORY_CARE_PROVIDER_SITE_OTHER): Payer: 59 | Admitting: Physician Assistant

## 2015-12-18 ENCOUNTER — Encounter: Payer: Self-pay | Admitting: Physician Assistant

## 2015-12-18 VITALS — BP 110/80 | HR 94 | Temp 97.5°F | Resp 16 | Ht 61.0 in | Wt 238.4 lb

## 2015-12-18 DIAGNOSIS — I10 Essential (primary) hypertension: Secondary | ICD-10-CM

## 2015-12-18 DIAGNOSIS — Z79899 Other long term (current) drug therapy: Secondary | ICD-10-CM | POA: Diagnosis not present

## 2015-12-18 NOTE — Progress Notes (Signed)
Assessment and Plan: Fatigue- normal labs- ? Allergies versus dehydration- check BMP- may need to do lisinopril 40/12.5 HTN- check BMP continue lisinopril 40, will call in med with new BMP  HPI 60 y.o.female presents for BP check. States that she has been tired for several months, worse the last 6 weeks. She is on lisinopril to 20/12.5mg  BID. Has had HA, some fatigue, allergy symtoms.  BP Readings from Last 3 Encounters:  12/18/15 110/80  12/04/15 140/98  11/26/15 158/102   Lab Results  Component Value Date   TSH 2.580 12/05/2015    Past Medical History  Diagnosis Date  . IBS (irritable bowel syndrome)     Has not been a problem since sx  . GERD (gastroesophageal reflux disease)   . Allergy   . Arthritis   . OSA (obstructive sleep apnea)     resolved with Lap Band  . Hypertension     HX OF HYPERTENSION PRIOR TO LAP BANDING - WAS TAKEN OFF BP MED  . Numbness     LEFT FOOT     Allergies  Allergen Reactions  . Mucinex [Guaifenesin Er] Other (See Comments)    "feels weird and dizzy"  . Tramadol Itching      Current Outpatient Prescriptions on File Prior to Visit  Medication Sig Dispense Refill  . Cholecalciferol (VITAMIN D-3 PO) Take 2,000 Units by mouth every evening.     Marland Kitchen lisinopril-hydrochlorothiazide (ZESTORETIC) 20-12.5 MG tablet Take 1 tablet by mouth daily. 30 tablet 11  . loratadine (CLARITIN) 10 MG tablet Take 10 mg by mouth every evening.     . meloxicam (MOBIC) 7.5 MG tablet TAKE ONE TABLET BY MOUTH ONCE DAILY WITH FOOD 90 tablet 1  . Multiple Vitamin (MULTIVITAMIN) capsule Take 1 capsule by mouth at bedtime.     Marland Kitchen oxyCODONE-acetaminophen (PERCOCET/ROXICET) 5-325 MG tablet     . Pantoprazole Sodium (PROTONIX PO) Take 20 mg by mouth every evening.     . venlafaxine XR (EFFEXOR-XR) 75 MG 24 hr capsule Take 1 capsule (75 mg total) by mouth daily. Take 1 capsule by mouth 90 capsule 3   No current facility-administered medications on file prior to visit.     ROS: all negative except above.   Physical Exam: Filed Weights   12/18/15 1606  Weight: 238 lb 6.4 oz (108.138 kg)   BP 110/80 mmHg  Pulse 94  Temp(Src) 97.5 F (36.4 C) (Temporal)  Resp 16  Ht 5\' 1"  (1.549 m)  Wt 238 lb 6.4 oz (108.138 kg)  BMI 45.07 kg/m2  SpO2 99% General Appearance: Well nourished, in no apparent distress. Eyes: PERRLA, EOMs, conjunctiva no swelling or erythema Sinuses: No Frontal/maxillary tenderness ENT/Mouth: Ext aud canals clear, TMs without erythema, bulging. No erythema, swelling, or exudate on post pharynx.  Tonsils not swollen or erythematous. Hearing normal.  Neck: Supple, thyroid normal.  Respiratory: Respiratory effort normal, BS equal bilaterally without rales, rhonchi, wheezing or stridor.  Cardio: RRR with no MRGs. Brisk peripheral pulses without edema.  Abdomen: Soft, + BS.  Non tender, no guarding, rebound, hernias, masses. Lymphatics: Non tender without lymphadenopathy.  Musculoskeletal: Full ROM, 5/5 strength, normal gait.  Skin: Warm, dry without rashes, lesions, ecchymosis.  Neuro: Cranial nerves intact. Normal muscle tone, no cerebellar symptoms. Sensation intact.  Psych: Awake and oriented X 3, normal affect, Insight and Judgment appropriate.     [name], PA-C 4:24 PM [facility]

## 2015-12-18 NOTE — Patient Instructions (Addendum)
Add on low dose iron, every other day with 65mg  slow release.   Please pick one of the over the counter allergy medications below and take it once daily for allergies.  Claritin or loratadine cheapest but likely the weakest  Zyrtec or certizine at night because it can make you sleepy The strongest is allegra or fexafinadine  Cheapest at [employer], [employer], [employer]  Fatigue Fatigue is feeling tired all of the time, a lack of energy, or a lack of motivation. Occasional or mild fatigue is often a normal response to activity or life in general. However, long-lasting (chronic) or extreme fatigue may indicate an underlying medical condition. HOME CARE INSTRUCTIONS  Watch your fatigue for any changes. The following actions may help to lessen any discomfort you are feeling:  Talk to your health care provider about how much sleep you need each night. Try to get the required amount every night.  Take medicines only as directed by your health care provider.  Eat a healthy and nutritious diet. Ask your health care provider if you need help changing your diet.  Drink enough fluid to keep your urine clear or pale yellow.  Practice ways of relaxing, such as yoga, meditation, massage therapy, or acupuncture.  Exercise regularly.   Change situations that cause you stress. Try to keep your work and personal routine reasonable.  Do not abuse illegal drugs.  Limit alcohol intake to no more than 1 drink per day for nonpregnant women and 2 drinks per day for men. One drink equals 12 ounces of beer, 5 ounces of wine, or 1 ounces of hard liquor.  Take a multivitamin, if directed by your health care provider. SEEK MEDICAL CARE IF:   Your fatigue does not get better.  You have a fever.   You have unintentional weight loss or gain.  You have headaches.   You have difficulty:   Falling asleep.  Sleeping throughout the night.  You feel angry, guilty, anxious, or sad.   You are unable to have a  bowel movement (constipation).   You skin is dry.   Your legs or another part of your body is swollen.  SEEK IMMEDIATE MEDICAL CARE IF:   You feel confused.   Your vision is blurry.  You feel faint or pass out.   You have a severe headache.   You have severe abdominal, pelvic, or back pain.   You have chest pain, shortness of breath, or an irregular or fast heartbeat.   You are unable to urinate or you urinate less than normal.   You develop abnormal bleeding, such as bleeding from the rectum, vagina, nose, lungs, or nipples.  You vomit blood.   You have thoughts about harming yourself or committing suicide.   You are worried that you might harm someone else.    This information is not intended to replace advice given to you by your health care provider. Make sure you discuss any questions you have with your health care provider.   Document Released: 06/14/2007 Document Revised: 09/07/2014 Document Reviewed: 12/19/2013 Elsevier Interactive Patient Education [employer].

## 2015-12-19 LAB — BASIC METABOLIC PANEL WITH GFR
BUN: 24 mg/dL (ref 7–25)
CHLORIDE: 99 mmol/L (ref 98–110)
CO2: 26 mmol/L (ref 20–31)
CREATININE: 0.75 mg/dL (ref 0.50–0.99)
Calcium: 9.2 mg/dL (ref 8.6–10.4)
GFR, Est African American: >89 mL/min (ref >=60)
GFR, Est Non African American: 86 mL/min (ref >=60)
Glucose, Bld: 135 mg/dL — ABNORMAL HIGH (ref 65–99)
POTASSIUM: 3.8 mmol/L (ref 3.5–5.3)
SODIUM: 139 mmol/L (ref 135–146)

## 2015-12-19 LAB — MAGNESIUM: MAGNESIUM: 1.7 mg/dL (ref 1.5–2.5)

## 2015-12-19 MED ORDER — LISINOPRIL 40 MG PO TABS: 40.0000 mg | ORAL_TABLET | Freq: Every day | ORAL | Status: DC

## 2015-12-19 MED ORDER — HYDROCHLOROTHIAZIDE 12.5 MG PO TABS: 12.5000 mg | ORAL_TABLET | Freq: Every day | ORAL | Status: DC

## 2015-12-19 NOTE — Addendum Note (Signed)
Addended by: [name] R on: 12/19/2015 07:26 AM   Modules accepted: Orders, Medications

## 2016-01-02 ENCOUNTER — Other Ambulatory Visit: Payer: Self-pay | Admitting: Orthopedic Surgery

## 2016-01-19 ENCOUNTER — Encounter: Payer: Self-pay | Admitting: *Deleted

## 2016-02-07 ENCOUNTER — Encounter (HOSPITAL_COMMUNITY)
Admission: RE | Admit: 2016-02-07 | Discharge: 2016-02-07 | Disposition: A | Discharge status: Home / Self Care | Payer: 59 | Source: Ambulatory Visit | Attending: Orthopedic Surgery | Admitting: Orthopedic Surgery

## 2016-02-07 ENCOUNTER — Encounter (HOSPITAL_COMMUNITY): Payer: Self-pay

## 2016-02-07 DIAGNOSIS — M1711 Unilateral primary osteoarthritis, right knee: Secondary | ICD-10-CM | POA: Diagnosis not present

## 2016-02-07 DIAGNOSIS — Z01812 Encounter for preprocedural laboratory examination: Secondary | ICD-10-CM | POA: Insufficient documentation

## 2016-02-07 DIAGNOSIS — Z0183 Encounter for blood typing: Secondary | ICD-10-CM | POA: Insufficient documentation

## 2016-02-07 HISTORY — DX: Other specified postprocedural states: Z98.890

## 2016-02-07 HISTORY — DX: Nausea with vomiting, unspecified: R11.2

## 2016-02-07 LAB — BASIC METABOLIC PANEL
ANION GAP: 6 (ref 5–15)
BUN: 16 mg/dL (ref 6–20)
CHLORIDE: 103 mmol/L (ref 101–111)
CO2: 29 mmol/L (ref 22–32)
CREATININE: 0.61 mg/dL (ref 0.44–1.00)
Calcium: 9.7 mg/dL (ref 8.9–10.3)
GFR calc non Af Amer: >60 mL/min (ref >60)
Glucose, Bld: 129 mg/dL — ABNORMAL HIGH (ref 65–99)
POTASSIUM: 4 mmol/L (ref 3.5–5.1)
SODIUM: 138 mmol/L (ref 135–145)

## 2016-02-07 LAB — CBC WITH DIFFERENTIAL/PLATELET
BASOS PCT: 0 %
Basophils Absolute: 0 10*3/uL (ref 0.0–0.1)
EOS ABS: 0.1 10*3/uL (ref 0.0–0.7)
Eosinophils Relative: 2 %
HCT: 40.1 % (ref 36.0–46.0)
HEMOGLOBIN: 13 g/dL (ref 12.0–15.0)
LYMPHS ABS: 2.1 10*3/uL (ref 0.7–4.0)
Lymphocytes Relative: 40 %
MCH: 31.9 pg (ref 26.0–34.0)
MCHC: 32.4 g/dL (ref 30.0–36.0)
MCV: 98.3 fL (ref 78.0–100.0)
MONOS PCT: 7 %
Monocytes Absolute: 0.4 10*3/uL (ref 0.1–1.0)
NEUTROS ABS: 2.7 10*3/uL (ref 1.7–7.7)
NEUTROS PCT: 51 %
Platelets: 233 10*3/uL (ref 150–400)
RBC: 4.08 MIL/uL (ref 3.87–5.11)
RDW: 13.3 % (ref 11.5–15.5)
WBC: 5.3 10*3/uL (ref 4.0–10.5)

## 2016-02-07 LAB — SURGICAL PCR SCREEN
MRSA, PCR: NEGATIVE
STAPHYLOCOCCUS AUREUS: NEGATIVE

## 2016-02-07 LAB — TYPE AND SCREEN
ABO/RH(D): O NEG
Antibody Screen: NEGATIVE
Weak D: POSITIVE

## 2016-02-07 LAB — URINALYSIS, ROUTINE W REFLEX MICROSCOPIC
Bilirubin Urine: NEGATIVE
Glucose, UA: NEGATIVE mg/dL
Hgb urine dipstick: NEGATIVE
Ketones, ur: NEGATIVE mg/dL
LEUKOCYTES UA: NEGATIVE
NITRITE: NEGATIVE
PH: 6 (ref 5.0–8.0)
Protein, ur: NEGATIVE mg/dL
SPECIFIC GRAVITY, URINE: 1.01 (ref 1.005–1.030)

## 2016-02-07 LAB — PROTIME-INR
INR: 0.93 (ref 0.00–1.49)
PROTHROMBIN TIME: 12.7 s (ref 11.6–15.2)

## 2016-02-07 LAB — ABO/RH: ABO/RH(D): O NEG

## 2016-02-07 LAB — APTT: aPTT: 27 seconds (ref 24–37)

## 2016-02-07 NOTE — Progress Notes (Signed)
PCP - Dr. Melford Aase; [name] Cardiologist - denies  EKG - 12/04/15 CXR - 10/27/15  Echo-denies Stress test - 2003 Cardiac Cath - denies  Patient denies chest pain and shortness of breath at PAT appointment.

## 2016-02-07 NOTE — Pre-Procedure Instructions (Signed)
Maureen [name]  02/07/2016      [id number] [address], [place] [address] Phone: [phone] Fax: [phone]  [place], [address] [place] [address] Phone: [phone] Fax: [phone]  [facility] [place], [place] - [address] [place] [address] Phone: [phone] Fax: [phone]    Your procedure is scheduled on Monday, June 19th, 2017.  Report to [facility] Admitting at 5:30 A.M.   Call this number if you have problems the morning of surgery:  [phone]   Remember:  Do not eat food or drink liquids after midnight.  Take these medicines the morning of surgery with A SIP OF WATER: Omeprazole (Prilosec), Venlafaxine XR (Effexor-XR).   7 days prior to surgery, stop taking: Meloxicam (Mobic), Aspirin, NSAIDS, Aleve, Naproxen, Ibuprofen, Advil, Motrin, BC's, Goody's, Fish oil, all herbal medications, and all vitamins.    Do not wear jewelry, make-up or nail polish.  Do not wear lotions, powders, or perfumes.    Do not shave 48 hours prior to surgery.    Do not bring valuables to the hospital.   [facility] is not responsible for any belongings or valuables.  Contacts, dentures or bridgework may not be worn into surgery.  Leave your suitcase in the car.  After surgery it may be brought to your room.  For patients admitted to the hospital, discharge time will be determined by your treatment team.  Patients discharged the day of surgery will not be allowed to drive home.   Special instructions:  See attached.   Please read over the following fact sheets that you were given. Blood Transfusion Information and MRSA Information     [place]- Preparing For Surgery  Before surgery, you can play an important role. Because skin is not sterile, your skin needs to be as free of germs as  possible. You can reduce the number of germs on your skin by washing with CHG (chlorahexidine gluconate) Soap before surgery.  CHG is an antiseptic cleaner which kills germs and bonds with the skin to continue killing germs even after washing.  Please do not use if you have an allergy to CHG or antibacterial soaps. If your skin becomes reddened/irritated stop using the CHG.  Do not shave (including legs and underarms) for at least 48 hours prior to first CHG shower. It is OK to shave your face.  Please follow these instructions carefully.   1. Shower the NIGHT BEFORE SURGERY and the MORNING OF SURGERY with CHG.   2. If you chose to wash your hair, wash your hair first as usual with your normal shampoo.  3. After you shampoo, rinse your hair and body thoroughly to remove the shampoo.  4. Use CHG as you would any other liquid soap. You can apply CHG directly to the skin and wash gently with a scrungie or a clean washcloth.   5. Apply the CHG Soap to your body ONLY FROM THE NECK DOWN.  Do not use on open wounds or open sores. Avoid contact with your eyes, ears, mouth and genitals (private parts). Wash genitals (private parts) with your normal soap.  6. Wash thoroughly, paying special attention to the area where your surgery will be performed.  7. Thoroughly rinse your body with warm water from the neck down.  8. DO NOT shower/wash with  your normal soap after using and rinsing off the CHG Soap.  9. Pat yourself dry with a CLEAN TOWEL.   10. Wear CLEAN PAJAMAS   11. Place CLEAN SHEETS on your bed the night of your first shower and DO NOT SLEEP WITH PETS.    Day of Surgery: Do not apply any deodorants/lotions. Please wear clean clothes to the hospital/surgery center.

## 2016-02-13 NOTE — H&P (Signed)
TOTAL KNEE ADMISSION H&P  Patient is being admitted for right total knee arthroplasty.  Subjective:  Chief Complaint:right knee pain.  HPI: Maureen [name], 60 y.o. female, has a history of pain and functional disability in the right knee due to arthritis and has failed non-surgical conservative treatments for greater than 12 weeks to includeNSAID's and/or analgesics, corticosteriod injections, flexibility and strengthening excercises, weight reduction as appropriate and activity modification.  Onset of symptoms was gradual, starting 2 years ago with gradually worsening course since that time. The patient noted no past surgery on the right knee(s).  Patient currently rates pain in the right knee(s) at 10 out of 10 with activity. Patient has night pain, worsening of pain with activity and weight bearing, pain that interferes with activities of daily living, pain with passive range of motion and joint swelling.  Patient has evidence of joint subluxation and joint space narrowing by imaging studies.   There is no active infection.  Patient Active Problem List   Diagnosis Date Noted  . Anemia 12/04/2015  . Female genuine stress incontinence 10/29/2015  . Arthritis, degenerative 10/29/2015  . Allergic rhinitis 10/29/2015  . Prediabetes 12/03/2014  . Hyperlipidemia 12/03/2014  . Morbid obesity ([place]) 12/03/2014  . Vitamin D deficiency 12/03/2014  . Medication management 12/03/2014  . IBS (irritable bowel syndrome)   . GERD (gastroesophageal reflux disease)   . Hypertension   . Asthma   . LAP-BAND surgery status 01/17/2013   Past Medical History  Diagnosis Date  . IBS (irritable bowel syndrome)   . Allergy     seasonal  . Arthritis   . Hypertension     HX OF HYPERTENSION PRIOR TO LAP BANDING - WAS TAKEN OFF BP MED  . Numbness     LEFT FOOT  . PONV (postoperative nausea and vomiting)     PONV with gallbladder - no PONV since that procedure  . OSA (obstructive sleep apnea)     resolved  with Lap Band  . GERD (gastroesophageal reflux disease)     Past Surgical History  Procedure Laterality Date  . Abdominal hysterectomy  2004  . Cholecystectomy  2003  . Breast surgery  1972    benign tumor  . Laparoscopic gastric banding  11/05/09    Dr. Hassell Done  . Mole removal    . Laparoscopic gastric banding with hiatal hernia repair N/A 01/22/2015    Procedure: LAPAROSCOPIC TAKEDOWN AND REVISION OF GASTRIC BAND REVISION WITH UPPER ENDOSCOPY;  Surgeon: Johnathan Hausen, MD;  Location: [facility];  Service: General;  Laterality: N/A;  . Toe surgery Left     "little toe"  . Colonoscopy    . Esophagogastroduodenoscopy      No prescriptions prior to admission   Allergies  Allergen Reactions  . Mucinex [Guaifenesin Er] Other (See Comments)    "feels weird and dizzy"  . Tramadol Itching    Social History  Substance Use Topics  . Smoking status: Former Smoker -- 0.25 packs/day    Types: Cigarettes    Quit date: 08/31/1998  . Smokeless tobacco: Never Used  . Alcohol Use: No    Family History  Problem Relation Age of Onset  . Hyperlipidemia Father   . Heart disease Father      Review of Systems  Constitutional: Negative.   HENT: Negative.   Eyes: Negative.   Respiratory: Negative.   Cardiovascular: Negative.   Gastrointestinal: Negative.   Genitourinary:       Poor bladder control  Musculoskeletal: Positive for  joint pain.  Skin: Negative.   Neurological: Negative.   Psychiatric/Behavioral: Negative.     Objective:  Physical Exam  Constitutional: She is oriented to person, place, and time. She appears well-developed and well-nourished.  HENT:  Head: Normocephalic and atraumatic.  Neck: Normal range of motion. Neck supple.  Cardiovascular: Intact distal pulses.   Respiratory: Effort normal.  Musculoskeletal: She exhibits tenderness.  There is tenderness along the medial joint line of the right knee, no palpable effusion, 5 varus deformity.  Range of motion 5/115  limited by adipose tissue, pain on extreme flexion.  Collateral ligaments are stable.    Neurological: She is alert and oriented to person, place, and time.  Skin: Skin is warm and dry.  Psychiatric: She has a normal mood and affect. Her behavior is normal. Judgment and thought content normal.    Vital signs in last 24 hours:    Labs:   Estimated body mass index is 45.07 kg/(m^2) as calculated from the following:   Height as of 12/18/15: 5\' 1"  (1.549 m).   Weight as of 12/18/15: 108.138 kg (238 lb 6.4 oz).   Imaging Review Plain radiographs demonstrate bone-on-bone medial compartment arthritis of the right knee with mild lateral subluxation of the tibia.   Assessment/Plan:  End stage arthritis, right knee   The patient history, physical examination, clinical judgment of the provider and imaging studies are consistent with end stage degenerative joint disease of the right knee(s) and total knee arthroplasty is deemed medically necessary. The treatment options including medical management, injection therapy arthroscopy and arthroplasty were discussed at length. The risks and benefits of total knee arthroplasty were presented and reviewed. The risks due to aseptic loosening, infection, stiffness, patella tracking problems, thromboembolic complications and other imponderables were discussed. The patient acknowledged the explanation, agreed to proceed with the plan and consent was signed. Patient is being admitted for inpatient treatment for surgery, pain control, PT, OT, prophylactic antibiotics, VTE prophylaxis, progressive ambulation and ADL's and discharge planning. The patient is planning to be discharged home with home health services

## 2016-02-16 DIAGNOSIS — M1711 Unilateral primary osteoarthritis, right knee: Secondary | ICD-10-CM | POA: Diagnosis present

## 2016-02-16 MED ORDER — BUPIVACAINE LIPOSOME 1.3 % IJ SUSP
20.0000 mL | INTRAMUSCULAR | Status: AC
  Administered 2016-02-17: 20 mL
  Filled 2016-02-16 (×2): qty 20

## 2016-02-16 MED ORDER — TRANEXAMIC ACID 1000 MG/10ML IV SOLN
2000.0000 mg | INTRAVENOUS | Status: AC
  Administered 2016-02-17: 2000 mg via TOPICAL
  Filled 2016-02-16 (×2): qty 20

## 2016-02-16 MED ORDER — TRANEXAMIC ACID 1000 MG/10ML IV SOLN
1000.0000 mg | INTRAVENOUS | Status: AC
  Administered 2016-02-17: 1000 mg via INTRAVENOUS
  Filled 2016-02-16 (×2): qty 10

## 2016-02-16 MED ORDER — DEXTROSE 5 % IV SOLN
3.0000 g | INTRAVENOUS | Status: AC
  Administered 2016-02-17: 2 g via INTRAVENOUS
  Filled 2016-02-16 (×2): qty 3000

## 2016-02-17 ENCOUNTER — Inpatient Hospital Stay (HOSPITAL_COMMUNITY): Payer: 59 | Admitting: Anesthesiology

## 2016-02-17 ENCOUNTER — Encounter (HOSPITAL_COMMUNITY): Payer: Self-pay | Admitting: Anesthesiology

## 2016-02-17 ENCOUNTER — Encounter (HOSPITAL_COMMUNITY)
Admission: RE | Disposition: A | Discharge status: Home Health | Payer: Self-pay | Source: Ambulatory Visit | Attending: Orthopedic Surgery

## 2016-02-17 ENCOUNTER — Inpatient Hospital Stay (HOSPITAL_COMMUNITY)
Admission: RE | Admit: 2016-02-17 | Discharge: 2016-02-19 | DRG: 470 | Disposition: A | Discharge status: Home Health | Payer: 59 | Source: Ambulatory Visit | Attending: Orthopedic Surgery | Admitting: Orthopedic Surgery

## 2016-02-17 DIAGNOSIS — M25561 Pain in right knee: Secondary | ICD-10-CM | POA: Diagnosis present

## 2016-02-17 DIAGNOSIS — J45909 Unspecified asthma, uncomplicated: Secondary | ICD-10-CM | POA: Diagnosis present

## 2016-02-17 DIAGNOSIS — E662 Morbid (severe) obesity with alveolar hypoventilation: Secondary | ICD-10-CM | POA: Diagnosis present

## 2016-02-17 DIAGNOSIS — E559 Vitamin D deficiency, unspecified: Secondary | ICD-10-CM | POA: Diagnosis present

## 2016-02-17 DIAGNOSIS — M1711 Unilateral primary osteoarthritis, right knee: Secondary | ICD-10-CM | POA: Diagnosis present

## 2016-02-17 DIAGNOSIS — Z87891 Personal history of nicotine dependence: Secondary | ICD-10-CM | POA: Diagnosis not present

## 2016-02-17 DIAGNOSIS — Z6841 Body Mass Index (BMI) 40.0 and over, adult: Secondary | ICD-10-CM | POA: Diagnosis not present

## 2016-02-17 DIAGNOSIS — K589 Irritable bowel syndrome without diarrhea: Secondary | ICD-10-CM | POA: Diagnosis present

## 2016-02-17 DIAGNOSIS — Z9884 Bariatric surgery status: Secondary | ICD-10-CM | POA: Diagnosis not present

## 2016-02-17 DIAGNOSIS — K219 Gastro-esophageal reflux disease without esophagitis: Secondary | ICD-10-CM | POA: Diagnosis present

## 2016-02-17 DIAGNOSIS — I10 Essential (primary) hypertension: Secondary | ICD-10-CM | POA: Diagnosis present

## 2016-02-17 DIAGNOSIS — D62 Acute posthemorrhagic anemia: Secondary | ICD-10-CM | POA: Diagnosis not present

## 2016-02-17 DIAGNOSIS — R7303 Prediabetes: Secondary | ICD-10-CM | POA: Diagnosis present

## 2016-02-17 DIAGNOSIS — E785 Hyperlipidemia, unspecified: Secondary | ICD-10-CM | POA: Diagnosis present

## 2016-02-17 HISTORY — DX: Personal history of other diseases of the digestive system: Z87.19

## 2016-02-17 HISTORY — PX: TOTAL KNEE ARTHROPLASTY: SHX125

## 2016-02-17 LAB — GLUCOSE, CAPILLARY: Glucose-Capillary: 88 mg/dL (ref 65–99)

## 2016-02-17 SURGERY — ARTHROPLASTY, KNEE, TOTAL
Anesthesia: Spinal | Site: Knee | Laterality: Right

## 2016-02-17 MED ORDER — PHENYLEPHRINE HCL 10 MG/ML IJ SOLN
INTRAMUSCULAR | Status: DC | PRN
  Administered 2016-02-17: 80 ug via INTRAVENOUS
  Administered 2016-02-17: 8 ug via INTRAVENOUS

## 2016-02-17 MED ORDER — BUPIVACAINE-EPINEPHRINE (PF) 0.25% -1:200000 IJ SOLN
INTRAMUSCULAR | Status: DC | PRN
  Administered 2016-02-17: 50 mL

## 2016-02-17 MED ORDER — PROPOFOL 10 MG/ML IV BOLUS
INTRAVENOUS | Status: DC | PRN
  Administered 2016-02-17: 20 mg via INTRAVENOUS

## 2016-02-17 MED ORDER — OXYCODONE HCL 5 MG PO TABS
5.0000 mg | ORAL_TABLET | ORAL | Status: DC | PRN
  Administered 2016-02-17 (×3): 10 mg via ORAL
  Filled 2016-02-17 (×5): qty 2

## 2016-02-17 MED ORDER — METOCLOPRAMIDE HCL 5 MG PO TABS: 5.0000 mg | ORAL_TABLET | Freq: Three times a day (TID) | ORAL | Status: DC | PRN

## 2016-02-17 MED ORDER — MELOXICAM 7.5 MG PO TABS
7.5000 mg | ORAL_TABLET | Freq: Two times a day (BID) | ORAL | Status: DC
  Administered 2016-02-17 – 2016-02-19 (×4): 7.5 mg via ORAL
  Filled 2016-02-17 (×4): qty 1

## 2016-02-17 MED ORDER — BUPIVACAINE-EPINEPHRINE (PF) 0.25% -1:200000 IJ SOLN
INTRAMUSCULAR | Status: AC
  Filled 2016-02-17: qty 60

## 2016-02-17 MED ORDER — ONDANSETRON HCL 4 MG/2ML IJ SOLN: 4.0000 mg | Freq: Four times a day (QID) | INTRAMUSCULAR | Status: DC | PRN

## 2016-02-17 MED ORDER — LACTATED RINGERS IV SOLN
INTRAVENOUS | Status: DC | PRN
  Administered 2016-02-17 (×2): via INTRAVENOUS

## 2016-02-17 MED ORDER — ASPIRIN EC 325 MG PO TBEC
325.0000 mg | DELAYED_RELEASE_TABLET | Freq: Every day | ORAL | Status: DC
  Administered 2016-02-18 – 2016-02-19 (×2): 325 mg via ORAL
  Filled 2016-02-17 (×3): qty 1

## 2016-02-17 MED ORDER — DEXTROSE-NACL 5-0.45 % IV SOLN: INTRAVENOUS | Status: DC

## 2016-02-17 MED ORDER — FENTANYL CITRATE (PF) 250 MCG/5ML IJ SOLN
INTRAMUSCULAR | Status: AC
  Filled 2016-02-17: qty 5

## 2016-02-17 MED ORDER — OXYCODONE HCL 5 MG PO TABS: 5.0000 mg | ORAL_TABLET | Freq: Once | ORAL | Status: DC | PRN

## 2016-02-17 MED ORDER — FLEET ENEMA 7-19 GM/118ML RE ENEM: 1.0000 | ENEMA | Freq: Once | RECTAL | Status: DC | PRN

## 2016-02-17 MED ORDER — BUPIVACAINE HCL (PF) 0.5 % IJ SOLN
INTRAMUSCULAR | Status: AC
  Filled 2016-02-17: qty 10

## 2016-02-17 MED ORDER — METHOCARBAMOL 500 MG PO TABS
500.0000 mg | ORAL_TABLET | Freq: Four times a day (QID) | ORAL | Status: DC | PRN
  Administered 2016-02-17 – 2016-02-18 (×3): 500 mg via ORAL
  Filled 2016-02-17 (×3): qty 1

## 2016-02-17 MED ORDER — SODIUM CHLORIDE 0.9 % IJ SOLN
INTRAMUSCULAR | Status: DC | PRN
  Administered 2016-02-17: 50 mL

## 2016-02-17 MED ORDER — MIDAZOLAM HCL 5 MG/5ML IJ SOLN
INTRAMUSCULAR | Status: DC | PRN
  Administered 2016-02-17: 2 mg via INTRAVENOUS

## 2016-02-17 MED ORDER — METHOCARBAMOL 500 MG PO TABS: 500.0000 mg | ORAL_TABLET | Freq: Two times a day (BID) | ORAL | Status: DC

## 2016-02-17 MED ORDER — VITAMIN D-3 25 MCG (1000 UT) PO CAPS: 2000.0000 [IU] | ORAL_CAPSULE | Freq: Every evening | ORAL | Status: DC

## 2016-02-17 MED ORDER — CEFUROXIME SODIUM 750 MG IJ SOLR
INTRAMUSCULAR | Status: AC
  Filled 2016-02-17: qty 1500

## 2016-02-17 MED ORDER — IRON (FERROUS SULFATE) 142 (45 FE) MG PO TBCR: EXTENDED_RELEASE_TABLET | Freq: Every day | ORAL | Status: DC

## 2016-02-17 MED ORDER — FENTANYL CITRATE (PF) 100 MCG/2ML IJ SOLN
INTRAMUSCULAR | Status: DC | PRN
  Administered 2016-02-17: 50 ug via INTRAVENOUS

## 2016-02-17 MED ORDER — MULTIVITAMINS PO CAPS: 1.0000 | ORAL_CAPSULE | Freq: Every day | ORAL | Status: DC

## 2016-02-17 MED ORDER — PHENOL 1.4 % MT LIQD: 1.0000 | OROMUCOSAL | Status: DC | PRN

## 2016-02-17 MED ORDER — ACETAMINOPHEN 650 MG RE SUPP: 650.0000 mg | Freq: Four times a day (QID) | RECTAL | Status: DC | PRN

## 2016-02-17 MED ORDER — MENTHOL 3 MG MT LOZG
1.0000 | LOZENGE | OROMUCOSAL | Status: DC | PRN
Start: 2016-02-17 — End: 2016-02-19

## 2016-02-17 MED ORDER — HYDROCHLOROTHIAZIDE 25 MG PO TABS
12.5000 mg | ORAL_TABLET | Freq: Every day | ORAL | Status: DC
Start: 2016-02-17 — End: 2016-02-19
  Administered 2016-02-17 – 2016-02-19 (×3): 12.5 mg via ORAL
  Filled 2016-02-17 (×3): qty 1

## 2016-02-17 MED ORDER — HYDROMORPHONE HCL 1 MG/ML IJ SOLN: 0.2500 mg | INTRAMUSCULAR | Status: DC | PRN

## 2016-02-17 MED ORDER — BISACODYL 5 MG PO TBEC: 5.0000 mg | DELAYED_RELEASE_TABLET | Freq: Every day | ORAL | Status: DC | PRN

## 2016-02-17 MED ORDER — LIDOCAINE HCL (CARDIAC) 20 MG/ML IV SOLN
INTRAVENOUS | Status: DC | PRN
Start: 2016-02-17 — End: 2016-02-17
  Administered 2016-02-17: 30 mg via INTRAVENOUS

## 2016-02-17 MED ORDER — ONDANSETRON HCL 4 MG PO TABS: 4.0000 mg | ORAL_TABLET | Freq: Four times a day (QID) | ORAL | Status: DC | PRN

## 2016-02-17 MED ORDER — PHENYLEPHRINE 40 MCG/ML (10ML) SYRINGE FOR IV PUSH (FOR BLOOD PRESSURE SUPPORT)
PREFILLED_SYRINGE | INTRAVENOUS | Status: AC
  Filled 2016-02-17: qty 30

## 2016-02-17 MED ORDER — OMEPRAZOLE MAGNESIUM 20 MG PO TBEC: 20.0000 mg | DELAYED_RELEASE_TABLET | Freq: Every day | ORAL | Status: DC

## 2016-02-17 MED ORDER — SODIUM CHLORIDE 0.9 % IR SOLN
Status: DC | PRN
  Administered 2016-02-17 (×2): 1000 mL

## 2016-02-17 MED ORDER — OXYCODONE-ACETAMINOPHEN 5-325 MG PO TABS: 1.0000 | ORAL_TABLET | ORAL | Status: DC | PRN

## 2016-02-17 MED ORDER — DOCUSATE SODIUM 100 MG PO CAPS
100.0000 mg | ORAL_CAPSULE | Freq: Two times a day (BID) | ORAL | Status: DC
  Administered 2016-02-17 – 2016-02-19 (×4): 100 mg via ORAL
  Filled 2016-02-17 (×4): qty 1

## 2016-02-17 MED ORDER — 0.9 % SODIUM CHLORIDE (POUR BTL) OPTIME
TOPICAL | Status: DC | PRN
  Administered 2016-02-17: 1000 mL

## 2016-02-17 MED ORDER — PROPOFOL 500 MG/50ML IV EMUL
INTRAVENOUS | Status: DC | PRN
  Administered 2016-02-17: 50 ug/kg/min via INTRAVENOUS

## 2016-02-17 MED ORDER — MAGNESIUM 250 MG PO TABS: 1.0000 | ORAL_TABLET | Freq: Every day | ORAL | Status: DC

## 2016-02-17 MED ORDER — METOCLOPRAMIDE HCL 5 MG/ML IJ SOLN: 5.0000 mg | Freq: Three times a day (TID) | INTRAMUSCULAR | Status: DC | PRN

## 2016-02-17 MED ORDER — DIPHENHYDRAMINE HCL 12.5 MG/5ML PO ELIX: 12.5000 mg | ORAL_SOLUTION | ORAL | Status: DC | PRN

## 2016-02-17 MED ORDER — ONDANSETRON HCL 4 MG/2ML IJ SOLN: 4.0000 mg | Freq: Once | INTRAMUSCULAR | Status: DC | PRN

## 2016-02-17 MED ORDER — PANTOPRAZOLE SODIUM 40 MG PO TBEC
40.0000 mg | DELAYED_RELEASE_TABLET | Freq: Every day | ORAL | Status: DC
  Administered 2016-02-17 – 2016-02-18 (×2): 40 mg via ORAL
  Filled 2016-02-17 (×2): qty 1

## 2016-02-17 MED ORDER — SENNOSIDES-DOCUSATE SODIUM 8.6-50 MG PO TABS: 1.0000 | ORAL_TABLET | Freq: Every evening | ORAL | Status: DC | PRN

## 2016-02-17 MED ORDER — VENLAFAXINE HCL ER 75 MG PO CP24
75.0000 mg | ORAL_CAPSULE | Freq: Every day | ORAL | Status: DC
  Administered 2016-02-17 – 2016-02-19 (×3): 75 mg via ORAL
  Filled 2016-02-17 (×3): qty 1

## 2016-02-17 MED ORDER — MIDAZOLAM HCL 2 MG/2ML IJ SOLN
INTRAMUSCULAR | Status: AC
  Filled 2016-02-17: qty 2

## 2016-02-17 MED ORDER — ALUM & MAG HYDROXIDE-SIMETH 200-200-20 MG/5ML PO SUSP: 30.0000 mL | ORAL | Status: DC | PRN

## 2016-02-17 MED ORDER — KCL IN DEXTROSE-NACL 20-5-0.45 MEQ/L-%-% IV SOLN
INTRAVENOUS | Status: DC
  Administered 2016-02-17: 13:00:00 via INTRAVENOUS
  Filled 2016-02-17: qty 1000

## 2016-02-17 MED ORDER — METHOCARBAMOL 1000 MG/10ML IJ SOLN: 500.0000 mg | Freq: Four times a day (QID) | INTRAVENOUS | Status: DC | PRN

## 2016-02-17 MED ORDER — LISINOPRIL 40 MG PO TABS
40.0000 mg | ORAL_TABLET | Freq: Every day | ORAL | Status: DC
  Administered 2016-02-17 – 2016-02-19 (×3): 40 mg via ORAL
  Filled 2016-02-17 (×3): qty 1

## 2016-02-17 MED ORDER — TRANEXAMIC ACID 1000 MG/10ML IV SOLN
1000.0000 mg | Freq: Once | INTRAVENOUS | Status: AC
  Administered 2016-02-17: 1000 mg via INTRAVENOUS
  Filled 2016-02-17: qty 10

## 2016-02-17 MED ORDER — ASPIRIN EC 325 MG PO TBEC: 325.0000 mg | DELAYED_RELEASE_TABLET | Freq: Two times a day (BID) | ORAL | Status: DC

## 2016-02-17 MED ORDER — ACETAMINOPHEN 325 MG PO TABS: 650.0000 mg | ORAL_TABLET | Freq: Four times a day (QID) | ORAL | Status: DC | PRN

## 2016-02-17 MED ORDER — CHLORHEXIDINE GLUCONATE 4 % EX LIQD: 60.0000 mL | Freq: Once | CUTANEOUS | Status: DC

## 2016-02-17 MED ORDER — EPHEDRINE SULFATE 50 MG/ML IJ SOLN
INTRAMUSCULAR | Status: DC | PRN
  Administered 2016-02-17: 10 mg via INTRAVENOUS

## 2016-02-17 MED ORDER — HYDROMORPHONE HCL 1 MG/ML IJ SOLN
0.5000 mg | INTRAMUSCULAR | Status: DC | PRN
  Administered 2016-02-17 – 2016-02-18 (×8): 1 mg via INTRAVENOUS
  Filled 2016-02-17 (×9): qty 1

## 2016-02-17 MED ORDER — BUPIVACAINE HCL (PF) 0.75 % IJ SOLN
INTRAMUSCULAR | Status: DC | PRN
  Administered 2016-02-17: 2 mL via INTRATHECAL

## 2016-02-17 MED ORDER — SODIUM CHLORIDE 0.9 % IV SOLN
10.0000 mg | INTRAVENOUS | Status: DC | PRN
  Administered 2016-02-17: 25 ug/min via INTRAVENOUS

## 2016-02-17 MED ORDER — CEFUROXIME SODIUM 1.5 G IJ SOLR
INTRAMUSCULAR | Status: DC | PRN
  Administered 2016-02-17: 1.5 g

## 2016-02-17 MED ORDER — LORATADINE 10 MG PO TABS
10.0000 mg | ORAL_TABLET | Freq: Every evening | ORAL | Status: DC
  Administered 2016-02-17 – 2016-02-18 (×2): 10 mg via ORAL
  Filled 2016-02-17 (×2): qty 1

## 2016-02-17 MED ORDER — PROPOFOL 500 MG/50ML IV EMUL
INTRAVENOUS | Status: AC
Start: 2016-02-17 — End: 2016-02-17
  Filled 2016-02-17: qty 50

## 2016-02-17 MED ORDER — OXYCODONE HCL 5 MG/5ML PO SOLN: 5.0000 mg | Freq: Once | ORAL | Status: DC | PRN

## 2016-02-17 SURGICAL SUPPLY — 60 items
BAG DECANTER FOR FLEXI CONT (MISCELLANEOUS) ×2 IMPLANT
BANDAGE ESMARK 6X9 LF (GAUZE/BANDAGES/DRESSINGS) ×1 IMPLANT
BLADE SAG 18X100X1.27 (BLADE) ×3 IMPLANT
BLADE SAW SGTL 13X75X1.27 (BLADE) ×3 IMPLANT
BLADE SURG ROTATE 9660 (MISCELLANEOUS) IMPLANT
BNDG CMPR 9X6 STRL LF SNTH (GAUZE/BANDAGES/DRESSINGS) ×1
BNDG CMPR MED 10X6 ELC LF (GAUZE/BANDAGES/DRESSINGS) ×1
BNDG CMPR MED 15X6 ELC VLCR LF (GAUZE/BANDAGES/DRESSINGS) ×1
BNDG ELASTIC 6X10 VLCR STRL LF (GAUZE/BANDAGES/DRESSINGS) ×3 IMPLANT
BNDG ELASTIC 6X15 VLCR STRL LF (GAUZE/BANDAGES/DRESSINGS) ×2 IMPLANT
BNDG ESMARK 6X9 LF (GAUZE/BANDAGES/DRESSINGS) ×3
BOWL SMART MIX CTS (DISPOSABLE) ×3 IMPLANT
CAPT KNEE TOTAL 3 ATTUNE ×2 IMPLANT
CEMENT HV SMART SET (Cement) ×6 IMPLANT
COVER SURGICAL LIGHT HANDLE (MISCELLANEOUS) ×3 IMPLANT
CUFF TOURNIQUET SINGLE 34IN LL (TOURNIQUET CUFF) ×1 IMPLANT
CUFF TOURNIQUET SINGLE 44IN (TOURNIQUET CUFF) ×2 IMPLANT
DRAPE EXTREMITY T 121X128X90 (DRAPE) ×3 IMPLANT
DRAPE U-SHAPE 47X51 STRL (DRAPES) ×3 IMPLANT
DRSG AQUACEL AG ADV 3.5X10 (GAUZE/BANDAGES/DRESSINGS) ×3 IMPLANT
DRSG AQUACEL AG ADV 3.5X14 (GAUZE/BANDAGES/DRESSINGS) ×2 IMPLANT
DURAPREP 26ML APPLICATOR (WOUND CARE) ×6 IMPLANT
ELECT REM PT RETURN 9FT ADLT (ELECTROSURGICAL) ×3
ELECTRODE REM PT RTRN 9FT ADLT (ELECTROSURGICAL) ×1 IMPLANT
EVACUATOR 1/8 PVC DRAIN (DRAIN) IMPLANT
GLOVE BIO SURGEON STRL SZ7.5 (GLOVE) ×3 IMPLANT
GLOVE BIO SURGEON STRL SZ8.5 (GLOVE) ×3 IMPLANT
GLOVE BIOGEL PI IND STRL 8 (GLOVE) ×1 IMPLANT
GLOVE BIOGEL PI IND STRL 9 (GLOVE) ×1 IMPLANT
GLOVE BIOGEL PI INDICATOR 8 (GLOVE) ×4
GLOVE BIOGEL PI INDICATOR 9 (GLOVE) ×2
GOWN STRL REUS W/ TWL LRG LVL3 (GOWN DISPOSABLE) ×1 IMPLANT
GOWN STRL REUS W/ TWL XL LVL3 (GOWN DISPOSABLE) ×2 IMPLANT
GOWN STRL REUS W/TWL LRG LVL3 (GOWN DISPOSABLE) ×3
GOWN STRL REUS W/TWL XL LVL3 (GOWN DISPOSABLE) ×6
HANDPIECE INTERPULSE COAX TIP (DISPOSABLE) ×3
HOOD PEEL AWAY FACE SHEILD DIS (HOOD) ×6 IMPLANT
KIT BASIN OR (CUSTOM PROCEDURE TRAY) ×3 IMPLANT
KIT ROOM TURNOVER OR (KITS) ×3 IMPLANT
MANIFOLD NEPTUNE II (INSTRUMENTS) ×3 IMPLANT
NDL SPNL 18GX3.5 QUINCKE PK (NEEDLE) IMPLANT
NEEDLE SPNL 18GX3.5 QUINCKE PK (NEEDLE) IMPLANT
NS IRRIG 1000ML POUR BTL (IV SOLUTION) ×3 IMPLANT
PACK TOTAL JOINT (CUSTOM PROCEDURE TRAY) ×3 IMPLANT
PAD ARMBOARD 7.5X6 YLW CONV (MISCELLANEOUS) ×6 IMPLANT
SET HNDPC FAN SPRY TIP SCT (DISPOSABLE) ×1 IMPLANT
SUT VIC AB 0 CT1 27 (SUTURE) ×3
SUT VIC AB 0 CT1 27XBRD ANBCTR (SUTURE) ×1 IMPLANT
SUT VIC AB 1 CTX 36 (SUTURE) ×3
SUT VIC AB 1 CTX36XBRD ANBCTR (SUTURE) ×1 IMPLANT
SUT VIC AB 2-0 CT1 27 (SUTURE)
SUT VIC AB 2-0 CT1 TAPERPNT 27 (SUTURE) IMPLANT
SUT VIC AB 3-0 CT1 27 (SUTURE) ×3
SUT VIC AB 3-0 CT1 TAPERPNT 27 (SUTURE) ×1 IMPLANT
SYR 50ML LL SCALE MARK (SYRINGE) ×3 IMPLANT
SYR CONTROL 10ML LL (SYRINGE) ×6 IMPLANT
TOWEL OR 17X24 6PK STRL BLUE (TOWEL DISPOSABLE) ×3 IMPLANT
TOWEL OR 17X26 10 PK STRL BLUE (TOWEL DISPOSABLE) ×3 IMPLANT
TRAY CATH 16FR W/PLASTIC CATH (SET/KITS/TRAYS/PACK) IMPLANT
WATER STERILE IRR 1000ML POUR (IV SOLUTION) ×3 IMPLANT

## 2016-02-17 NOTE — Op Note (Signed)
PATIENT ID:      Maureen [name]  MRN:     [id number] DOB/AGE:    [birth year] / 61 y.o.       OPERATIVE REPORT    DATE OF PROCEDURE:  02/17/2016       PREOPERATIVE DIAGNOSIS:   RIGHT KNEE OSTEOARTHRITIS      Estimated body mass index is 46.09 kg/(m^2) as calculated from the following:   Height as of this encounter: 5' (1.524 m).   Weight as of this encounter: 107.049 kg (236 lb).                                                        POSTOPERATIVE DIAGNOSIS:   RIGHT KNEE OSTEOARTHRITIS                                                                      PROCEDURE:  Procedure(s): TOTAL KNEE ARTHROPLASTY Using DepuyAttune RP implants #4R Femur, #5Tibia, 5 mm Attune RP bearing, 32 Patella     SURGEON: Laureano Hetzer J    ASSISTANT:   [employer]   (Present and scrubbed throughout the case, critical for assistance with exposure, retraction, instrumentation, and closure.)         ANESTHESIA: Spinal, 20cc Exparel, 50cc 0.25% Marcaine  EBL: 300  FLUID REPLACEMENT: 1600 crystalloid  TOURNIQUET TIME: 1min  Drains: None  Tranexamic Acid: 1gm iv, 2gm topical   COMPLICATIONS:  None         INDICATIONS FOR PROCEDURE: The patient has  RIGHT KNEE OSTEOARTHRITIS, Var deformities, XR shows bone on bone arthritis, lateral subluxation of tibia. Patient has failed all conservative measures including anti-inflammatory medicines, narcotics, attempts at  exercise and weight loss, cortisone injections and viscosupplementation.  Risks and benefits of surgery have been discussed, questions answered.   DESCRIPTION OF PROCEDURE: The patient identified by armband, received  IV antibiotics, in the holding area at [facility]. Patient taken to the operating room, appropriate anesthetic  monitors were attached, and Spinal anesthesia was  induced. Tourniquet  applied high to the operative thigh. Lateral post and foot positioner  applied to the table, the lower extremity was then prepped and draped  in  usual sterile fashion from the toes to the tourniquet. Time-out procedure was performed. We began the operation, with the knee flexed 120 degrees, by making the anterior midline incision starting at handbreadth above the patella going over the patella 1 cm medial to and 4 cm distal to the tibial tubercle. Small bleeders in the skin and the  subcutaneous tissue identified and cauterized. Transverse retinaculum was incised and reflected medially and a medial parapatellar arthrotomy was accomplished. the patella was everted and theprepatellar fat pad resected. The superficial medial collateral  ligament was then elevated from anterior to posterior along the proximal  flare of the tibia and anterior half of the menisci resected. The knee was hyperflexed exposing bone on bone arthritis. Peripheral and notch osteophytes as well as the cruciate ligaments were then resected. We continued to  work our way around posteriorly along the proximal tibia,  and externally  rotated the tibia subluxing it out from underneath the femur. A McHale  retractor was placed through the notch and a lateral Hohmann retractor  placed, and we then drilled through the proximal tibia in line with the  axis of the tibia followed by an intramedullary guide rod and 2-degree  posterior slope cutting guide. The tibial cutting guide, 3 degree posterior sloped, was pinned into place allowing resection of 5 mm of bone medially and 12 mm of bone laterally. Satisfied with the tibial resection, we then  entered the distal femur 2 mm anterior to the PCL origin with the  intramedullary guide rod and applied the distal femoral cutting guide  set at 9 mm, with 5 degrees of valgus. This was pinned along the  epicondylar axis. At this point, the distal femoral cut was accomplished without difficulty. We then sized for a #4R femoral component and pinned the guide in 3 degrees of external rotation. The chamfer cutting guide was pinned into place. The  anterior, posterior, and chamfer cuts were accomplished without difficulty followed by  the Attune RP box cutting guide and the box cut. We also removed posterior osteophytes from the posterior femoral condyles. At this  time, the knee was brought into full extension. We checked our  extension and flexion gaps and found them symmetric for a 5 mm bearing. Distracting in extension with a lamina spreader, the posterior horns of the menisci were removed, and Exparel, diluted to 60 cc, with 20cc NS, and 20cc 0.5% Marcaine,was injected into the capsule and synovium of the knee. The posterior patella cut was accomplished with the 9.5 mm Attune cutting guide, sized for a 70mm dome, and the fixation pegs drilled.The knee  was then once again hyperflexed exposing the proximal tibia. We sized for a # 5 tibial base plate, applied the smokestack and the conical reamer followed by the the Delta fin keel punch. We then hammered into place the Attune RP trial femoral component, drilled the lugs, inserted a  5 mm trial bearing, trial patellar button, and took the knee through range of motion from 0-130 degrees. No thumb pressure was required for patellar Tracking. At this point, the limb was wrapped with an Esmarch bandage and the tourniquet inflated to 350 mmHg. All trial components were removed, mating surfaces irrigated with pulse lavage, and dried with suction and sponges. A double batch of DePuy HV cement with 1500 mg of Zinacef was mixed and applied to all bony metallic mating surfaces except for the posterior condyles of the femur itself. In order, we  hammered into place the tibial tray and removed excess cement, the femoral component and removed excess cement. The final Attune RP bearing  was inserted, and the knee brought to full extension with compression.  The patellar button was clamped into place, and excess cement  removed. While the cement cured the wound was irrigated out with normal saline solution pulse  lavage. Ligament stability and patellar tracking were checked and found to be excellent. The parapatellar arthrotomy was closed with  running #1 Vicryl suture. The subcutaneous tissue with 0 and 2-0 undyed  Vicryl suture, and the skin with running 3-0 SQ vicryl. A dressing of Xeroform,  4 x 4, dressing sponges, Webril, and Ace wrap applied. The patient  awakened, and taken to recovery room without difficulty.   Calhoun Reichardt J 02/17/2016, 9:00 AM

## 2016-02-17 NOTE — Anesthesia Procedure Notes (Signed)
Spinal  Start time: 02/17/2016 7:20 AM End time: 02/17/2016 7:25 AM Staffing Anesthesiologist: Naydeen Speirs Performed by: anesthesiologist  Preanesthetic Checklist Completed: patient identified, site marked, surgical consent, pre-op evaluation, timeout performed, IV checked, risks and benefits discussed and monitors and equipment checked Spinal Block Patient position: sitting Prep: ChloraPrep Patient monitoring: heart rate, cardiac monitor, continuous pulse ox and blood pressure Approach: midline Location: L3-4 Needle Needle type: Pencan  Needle gauge: 25 G Assessment Sensory level: T6 Additional Notes Functioning IV was confirmed and monitors were applied. Sterile prep and drape, including hand hygiene, mask and sterile gloves were used. The patient was positioned and the spine was prepped. The skin was anesthetized with lidocaine.  Free flow of clear CSF was obtained prior to injecting local anesthetic into the CSF.  The spinal needle aspirated freely following injection.  The needle was carefully withdrawn.  The patient tolerated the procedure well. Consent was obtained prior to procedure with all questions answered and concerns addressed.  [place] Pink, MD

## 2016-02-17 NOTE — Interval H&P Note (Signed)
History and Physical Interval Note:  02/17/2016 7:12 AM  Maureen [name]  has presented today for surgery, with the diagnosis of RIGHT KNEE OSTEOARTHRITIS  The various methods of treatment have been discussed with the patient and family. After consideration of risks, benefits and other options for treatment, the patient has consented to  Procedure(s): TOTAL KNEE ARTHROPLASTY (Right) as a surgical intervention .  [name] history has been reviewed, patient examined, no change in status, stable for surgery.  I have reviewed the patient's chart and labs.  Questions were answered to the patient's satisfaction.     Kerin Salen

## 2016-02-17 NOTE — Anesthesia Postprocedure Evaluation (Signed)
Anesthesia Post Note  Patient: Maureen [name]  Procedure(s) Performed: Procedure(s) (LRB): TOTAL KNEE ARTHROPLASTY (Right)  Patient location during evaluation: PACU Anesthesia Type: Spinal Level of consciousness: oriented and awake and alert Pain management: pain level controlled Vital Signs Assessment: post-procedure vital signs reviewed and stable Respiratory status: spontaneous breathing, respiratory function stable and patient connected to nasal cannula oxygen Cardiovascular status: blood pressure returned to baseline and stable Postop Assessment: no headache and no backache Anesthetic complications: no    Last Vitals:  Filed Vitals:   02/17/16 1045 02/17/16 1105  BP: 115/71 123/67  Pulse: 65 67  Temp: 36.4 C 36.4 C  Resp: 12 18    Last Pain:  Filed Vitals:   02/17/16 1331  PainSc: 2                  Zenaida Deed

## 2016-02-17 NOTE — Evaluation (Signed)
Physical Therapy Evaluation Patient Details Name: Maureen [name] MRN: [id number] DOB: 1955-10-31 Today's Date: 02/17/2016   History of Present Illness  60 y.o. female now s/p Rt TKA. PMH: hypertension.   Clinical Impression  Pt is s/p TKA resulting in the deficits listed below (see PT Problem List). Pt able to ambulate 5 feet during initial session with rw and min guard assistance. Pt will benefit from skilled PT to increase their independence and safety with mobility to allow discharge to home with family support.       Follow Up Recommendations Home health PT;Supervision for mobility/OOB    Equipment Recommendations  3in1 (PT)    Recommendations for Other Services       Precautions / Restrictions Precautions Precautions: Knee;Fall Precaution Booklet Issued: Yes (comment) Precaution Comments: HEP provided, reviewed knee extension precautions Restrictions Weight Bearing Restrictions: Yes RLE Weight Bearing: Weight bearing as tolerated      Mobility  Bed Mobility Overal bed mobility: Needs Assistance Bed Mobility: Supine to Sit     Supine to sit: Min guard;HOB elevated (using rail to assist)        Transfers Overall transfer level: Needs assistance Equipment used: Rolling walker (2 wheeled) Transfers: Sit to/from Stand Sit to Stand: Min assist         General transfer comment: cues for hand placement  Ambulation/Gait Ambulation/Gait assistance: Min guard Ambulation Distance (Feet): 6 Feet Assistive device: Rolling walker (2 wheeled) Gait Pattern/deviations: Step-to pattern;Decreased weight shift to right;Decreased stance time - right Gait velocity: decreased   General Gait Details: steady pattern, cues for gait sequence. Distance limited by pt feeling mild nausea.   Stairs            Wheelchair Mobility    Modified Rankin (Stroke Patients Only)       Balance Overall balance assessment: Needs assistance Sitting-balance support: No upper  extremity supported Sitting balance-Leahy Scale: Good     Standing balance support: Bilateral upper extremity supported Standing balance-Leahy Scale: Poor Standing balance comment: using rw                             Pertinent Vitals/Pain Pain Assessment: 0-10 Pain Score: 4  Pain Location: Rt knee  Pain Descriptors / Indicators: Aching Pain Intervention(s): Limited activity within patient's tolerance;Monitored during session;Ice applied    Home Living Family/patient expects to be discharged to:: Private residence Living Arrangements: Spouse/significant other Available Help at Discharge: Available 24 hours/day (for this week, mother available following. ) Type of Home: House Home Access: Stairs to enter Entrance Stairs-Rails: Left Entrance Stairs-Number of Steps: 3 Home Layout: Able to live on main level with bedroom/bathroom Home Equipment: Walker - 2 wheels      Prior Function Level of Independence: Independent               Hand Dominance        Extremity/Trunk Assessment   Upper Extremity Assessment: Overall WFL for tasks assessed           Lower Extremity Assessment: RLE deficits/detail RLE Deficits / Details: Min assist with SLR       Communication   Communication: No difficulties  Cognition Arousal/Alertness: Awake/alert Behavior During Therapy: WFL for tasks assessed/performed Overall Cognitive Status: Within Functional Limits for tasks assessed                      General Comments      Exercises Total Joint  Exercises Ankle Circles/Pumps: AROM;Both;10 reps Quad Sets: Strengthening;Both;10 reps Gluteal Sets: Strengthening;Both;10 reps      Assessment/Plan    PT Assessment Patient needs continued PT services  PT Diagnosis Difficulty walking   PT Problem List Decreased strength;Decreased range of motion;Decreased activity tolerance;Decreased balance;Decreased mobility;Decreased coordination  PT Treatment  Interventions DME instruction;Gait training;Stair training;Functional mobility training;Therapeutic activities;Therapeutic exercise;Patient/family education   PT Goals (Current goals can be found in the Care Plan section) Acute Rehab PT Goals Patient Stated Goal: go home from the hospital PT Goal Formulation: With patient Time For Goal Achievement: 03/02/16 Potential to Achieve Goals: Good    Frequency 7X/week   Barriers to discharge        Co-evaluation               End of Session Equipment Utilized During Treatment: Gait belt Activity Tolerance: Patient tolerated treatment well Patient left: in chair;with call bell/phone within reach;with family/visitor present (in knee extension) Nurse Communication: Mobility status         Time: [id number] PT Time Calculation (min) (ACUTE ONLY): 33 min   Charges:   PT Evaluation $PT Eval Moderate Complexity: 1 Procedure PT Treatments $Therapeutic Activity: 8-22 mins   PT G Codes:        Cassell Clement, PT, CSCS Pager [phone] Office [phone]  02/17/2016, 2:05 PM

## 2016-02-17 NOTE — Progress Notes (Signed)
Orthopedic Tech Progress Note Patient Details:  Maureen [name] 11/10/1955 [id number]  CPM Left Knee CPM Left Knee: On Left Knee Flexion (Degrees): 40 Left Knee Extension (Degrees): 0 CPM Right Knee Additional Comments: trapeze bar patient helper Viewed order from doctor's order list  [name] 02/17/2016, 10:25 AM

## 2016-02-17 NOTE — Anesthesia Preprocedure Evaluation (Addendum)
Anesthesia Evaluation  Patient identified by MRN, date of birth, ID band Patient awake    Reviewed: Allergy & Precautions, NPO status , Patient's Chart, lab work & pertinent test results, reviewed documented beta blocker date and time   History of Anesthesia Complications (+) PONV  Airway Mallampati: II  TM Distance: <3 FB Neck ROM: Full    Dental  (+) Dental Advisory Given, Teeth Intact   Pulmonary asthma , sleep apnea , former smoker,    breath sounds clear to auscultation       Cardiovascular hypertension, Pt. on medications  Rhythm:Regular     Neuro/Psych    GI/Hepatic GERD  Medicated,SP Lap Band 2011   Endo/Other    Renal/GU      Musculoskeletal   Abdominal (+) + obese,   Peds  Hematology   Anesthesia Other Findings   Reproductive/Obstetrics                            Anesthesia Physical  Anesthesia Plan  ASA: III  Anesthesia Plan: Spinal   Post-op Pain Management:    Induction:   Airway Management Planned:   Additional Equipment:   Intra-op Plan:   Post-operative Plan: Extubation in OR  Informed Consent: I have reviewed the patients History and Physical, chart, labs and discussed the procedure including the risks, benefits and alternatives for the proposed anesthesia with the patient or authorized representative who has indicated his/her understanding and acceptance.   Dental Advisory Given  Plan Discussed with:   Anesthesia Plan Comments:        Anesthesia Quick Evaluation

## 2016-02-17 NOTE — Transfer of Care (Signed)
Immediate Anesthesia Transfer of Care Note  Patient: Maureen [name]  Procedure(s) Performed: Procedure(s): TOTAL KNEE ARTHROPLASTY (Right)  Patient Location: PACU  Anesthesia Type:Spinal  Level of Consciousness: awake, alert  and oriented  Airway & Oxygen Therapy: Patient Spontanous Breathing  Post-op Assessment: Report given to RN, Post -op Vital signs reviewed and stable and Patient moving all extremities X 4  Post vital signs: Reviewed and stable  Last Vitals:  Filed Vitals:   02/17/16 0617  BP: 134/75  Pulse: 77  Temp: 36.7 C  Resp: 20    Last Pain:  Filed Vitals:   02/17/16 0634  PainSc: 1       Patients Stated Pain Goal: 2 ([id number])  Complications: No apparent anesthesia complications

## 2016-02-17 NOTE — Discharge Instructions (Signed)

## 2016-02-18 ENCOUNTER — Encounter (HOSPITAL_COMMUNITY): Payer: Self-pay | Admitting: Orthopedic Surgery

## 2016-02-18 LAB — BASIC METABOLIC PANEL
Anion gap: 8 (ref 5–15)
BUN: 14 mg/dL (ref 6–20)
CO2: 26 mmol/L (ref 22–32)
CREATININE: 1.3 mg/dL — AB (ref 0.44–1.00)
Calcium: 9.1 mg/dL (ref 8.9–10.3)
Chloride: 100 mmol/L — ABNORMAL LOW (ref 101–111)
GFR, EST AFRICAN AMERICAN: 50 mL/min — AB (ref >60)
GFR, EST NON AFRICAN AMERICAN: 43 mL/min — AB (ref >60)
Glucose, Bld: 119 mg/dL — ABNORMAL HIGH (ref 65–99)
POTASSIUM: 4.3 mmol/L (ref 3.5–5.1)
SODIUM: 134 mmol/L — AB (ref 135–145)

## 2016-02-18 LAB — CBC
HEMATOCRIT: 37.5 % (ref 36.0–46.0)
HEMOGLOBIN: 12 g/dL (ref 12.0–15.0)
MCH: 32.3 pg (ref 26.0–34.0)
MCHC: 32 g/dL (ref 30.0–36.0)
MCV: 101.1 fL — ABNORMAL HIGH (ref 78.0–100.0)
Platelets: 235 10*3/uL (ref 150–400)
RBC: 3.71 MIL/uL — AB (ref 3.87–5.11)
RDW: 13.7 % (ref 11.5–15.5)
WBC: 8.5 10*3/uL (ref 4.0–10.5)

## 2016-02-18 MED ORDER — HYDROMORPHONE HCL 4 MG PO TABS: 2.0000 mg | ORAL_TABLET | ORAL | Status: DC | PRN

## 2016-02-18 MED ORDER — HYDROMORPHONE HCL 2 MG PO TABS
2.0000 mg | ORAL_TABLET | ORAL | Status: DC | PRN
  Administered 2016-02-18 – 2016-02-19 (×5): 2 mg via ORAL
  Filled 2016-02-18 (×5): qty 1

## 2016-02-18 NOTE — Progress Notes (Signed)
Physical Therapy Treatment Patient Details Name: Maureen [name] MRN: [id number] DOB: [birth year] Today's Date: 02/18/2016    History of Present Illness 60 y.o. female now s/p Rt TKA. PMH: hypertension.     PT Comments    Patient progressing with able to practice stairs this pm appropriate for home entry.  Also educated spouse how to assist verbally as not present during session.  Feel can review verbally tomorrow if pt wishes and review HEP prior to d/c home.  Follow Up Recommendations  Home health PT;Supervision for mobility/OOB     Equipment Recommendations  3in1 (PT)    Recommendations for Other Services       Precautions / Restrictions Precautions Precautions: Knee;Fall Restrictions Weight Bearing Restrictions: Yes RLE Weight Bearing: Weight bearing as tolerated    Mobility  Bed Mobility Overal bed mobility: Needs Assistance Bed Mobility: Supine to Sit     Supine to sit: Supervision Sit to supine: Supervision   General bed mobility comments: elevated HOB and used rail to sit up, used my belt to lift leg into bed for return to supine  Transfers Overall transfer level: Needs assistance Equipment used: Rolling walker (2 wheeled) Transfers: Sit to/from Stand Sit to Stand: Supervision         General transfer comment: cues for pushing from bed  Ambulation/Gait Ambulation/Gait assistance: Supervision Ambulation Distance (Feet): 180 Feet   Gait Pattern/deviations: Step-through pattern;Decreased stride length;Antalgic     General Gait Details: obtained shorter walker with improved fit   Stairs Stairs: Yes Stairs assistance: Min guard Stair Management: One rail Left;With cane;Forwards;Step to pattern Number of Stairs: 2 General stair comments: cues for sequencing and assist for safety; discussed with spouse how to assist  Wheelchair Mobility    Modified Rankin (Stroke Patients Only)       Balance Overall balance assessment: Needs  assistance Sitting-balance support: No upper extremity supported;Feet supported Sitting balance-Leahy Scale: Good     Standing balance support: Single extremity supported;Bilateral upper extremity supported;No upper extremity supported;During functional activity Standing balance-Leahy Scale: Fair Standing balance comment: stood at sink to brush teeth                    Cognition Arousal/Alertness: Awake/alert Behavior During Therapy: WFL for tasks assessed/performed Overall Cognitive Status: Within Functional Limits for tasks assessed                      Exercises Total Joint Exercises Long Arc Quad: AROM;Right;10 reps;Seated Knee Flexion: AROM;Right;10 reps;Seated    General Comments        Pertinent Vitals/Pain Pain Assessment: 0-10 Pain Score: 8  Pain Location: R knee in bed Pain Descriptors / Indicators: Sore Pain Intervention(s): Monitored during session;Repositioned    Home Living Family/patient expects to be discharged to:: Private residence Living Arrangements: Spouse/significant other Available Help at Discharge: Available 24 hours/day Type of Home: House Home Access: Stairs to enter Entrance Stairs-Rails: Left Home Layout: Able to live on main level with bedroom/bathroom Home Equipment: Walker - 2 wheels;Cane - single point;Grab bars - tub/shower      Prior Function Level of Independence: Independent          PT Goals (current goals can now be found in the care plan section) Acute Rehab PT Goals Patient Stated Goal: go home Progress towards PT goals: Progressing toward goals    Frequency  7X/week    PT Plan Current plan remains appropriate    Co-evaluation  End of Session Equipment Utilized During Treatment: Gait belt Activity Tolerance: Patient tolerated treatment well Patient left: in bed;with call bell/phone within reach     Time: 1525-1549 PT Time Calculation (min) (ACUTE ONLY): 24 min  Charges:  $Gait  Training: 8-22 mins $Therapeutic Activity: 8-22 mins                    G Codes:      Reginia Naas February 22, 2016, 4:51 PM  Magda Kiel, [place] 22-Feb-2016

## 2016-02-18 NOTE — Progress Notes (Signed)
Patient ID: LAYALI OKABE, female   DOB: 01-25-1956, 60 y.o.   MRN: [id number] PATIENT ID: NYESHIA TAVANI  MRN: [id number]  DOB/AGE:  07/30/1956 / 60 y.o.  1 Day Post-Op Procedure(s) (LRB): TOTAL KNEE ARTHROPLASTY (Right)    PROGRESS NOTE Subjective: Patient is alert, oriented, no Nausea, no Vomiting, yes passing gas. Taking PO well. Denies SOB, Chest or Calf Pain. Using Incentive Spirometer, PAS in place. Ambulate 60 with physical therapy yesterday and to the bathroom last night weightbearing as tolerated, CPM 0-40 Patient reports pain as 3/10. Oxycodone does not help with her pain but a lot of it seems to work well she would like to use the Dilaudid orally at home.  Objective: Vital signs in last 24 hours: Filed Vitals:   02/17/16 1105 02/17/16 2120 02/18/16 0120 02/18/16 0520  BP: 123/67 113/63 115/60 113/58  Pulse: 67 85 85 82  Temp: 97.5 F (36.4 C) 98.3 F (36.8 C) 98.5 F (36.9 C) 97.9 F (36.6 C)  TempSrc: Oral Oral Oral Oral  Resp: 18 16 15 16   Height:      Weight:      SpO2: 98% 97% 95% 94%      Intake/Output from previous day: I/O last 3 completed shifts: In: 2256.7 [P.O.:240; I.V.:1873.3; IV Piggyback:143.3] Out: 1250 [Urine:1000; Blood:250]   Intake/Output this shift:     LABORATORY DATA:  Recent Labs  02/17/16 0942 02/18/16 0403  WBC  --  8.5  HGB  --  12.0  HCT  --  37.5  PLT  --  235  NA  --  134*  K  --  4.3  CL  --  100*  CO2  --  26  BUN  --  14  CREATININE  --  1.30*  GLUCOSE  --  119*  GLUCAP 88  --   CALCIUM  --  9.1    Examination: Neurologically intact ABD soft Neurovascular intact Sensation intact distally Intact pulses distally Dorsiflexion/Plantar flexion intact Incision: dressing C/D/I No cellulitis present Compartment soft}  Assessment:   1 Day Post-Op Procedure(s) (LRB): TOTAL KNEE ARTHROPLASTY (Right) ADDITIONAL DIAGNOSIS: Expected Acute Blood Loss Anemia, morbid obesity, hypertension, GERD, history of lap band  procedure  Plan: PT/OT WBAT, CPM 5/hrs day until ROM 0-90 degrees, then D/C CPM, DC oxycodone start 2 mg Dilaudid. DVT Prophylaxis:  SCDx72hrs, ASA 325 mg BID x 2 weeks DISCHARGE PLAN: Home DISCHARGE NEEDS: HHPT, CPM, Walker and 3-in-1 comode seat     Katyra Tomassetti J 02/18/2016, 7:22 AM

## 2016-02-18 NOTE — Progress Notes (Signed)
Orthopedic Tech Progress Note Patient Details:  Maureen [name] 10-30-1955 [id number]  Patient ID: Maureen [name], female   DOB: 1955/11/24, 60 y.o.   MRN: [id number] Placed pt's rle on cpm @0 -55 degrees @1340   [name] 02/18/2016, 1:41 PM

## 2016-02-18 NOTE — Progress Notes (Signed)
Physical Therapy Treatment Patient Details Name: Maureen [name] MRN: [id number] DOB: [birth year] Today's Date: 02/18/2016    History of Present Illness 60 y.o. female now s/p Rt TKA. PMH: hypertension.     PT Comments    Patient progressing with ambulation in hallway and performed exercises with min A.  Feel continued skilled PT in the acute setting needed to progress home with HHPT and spouse assist.   Follow Up Recommendations  Home health PT;Supervision for mobility/OOB     Equipment Recommendations  3in1 (PT)    Recommendations for Other Services       Precautions / Restrictions Precautions Precautions: Knee;Fall Precaution Comments: HEP provided, reviewed knee extension precautions Restrictions Weight Bearing Restrictions: Yes RLE Weight Bearing: Weight bearing as tolerated    Mobility  Bed Mobility Overal bed mobility: Needs Assistance Bed Mobility: Sit to Supine       Sit to supine: Min guard   General bed mobility comments: for R LE  Transfers Overall transfer level: Needs assistance Equipment used: Rolling walker (2 wheeled) Transfers: Sit to/from Stand Sit to Stand: Min guard         General transfer comment: cues for hand placement and steadying assist  Ambulation/Gait Ambulation/Gait assistance: Supervision;Min guard Ambulation Distance (Feet): 150 Feet Assistive device: Rolling walker (2 wheeled) Gait Pattern/deviations: Step-to pattern;Step-through pattern;Decreased stride length;Antalgic     General Gait Details: difficulty with walker due to too tall, encouraged breathing, relaxing shoulders    Stairs            Wheelchair Mobility    Modified Rankin (Stroke Patients Only)       Balance Overall balance assessment: Needs assistance         Standing balance support: No upper extremity supported Standing balance-Leahy Scale: Fair Standing balance comment: can stand without UE support, but walker for ambulation                      Cognition Arousal/Alertness: Awake/alert Behavior During Therapy: WFL for tasks assessed/performed Overall Cognitive Status: Within Functional Limits for tasks assessed                      Exercises Total Joint Exercises Ankle Circles/Pumps: AROM;Both;10 reps;Seated Quad Sets: Strengthening;Both;10 reps;Seated Short Arc Quad: AROM;10 reps;Right;Seated Heel Slides: AAROM;Right;10 reps;Seated (one set with footrest up on recliner, one set with feet on floor) Hip ABduction/ADduction: AROM;10 reps;Right;Seated Straight Leg Raises: AAROM;Right;10 reps;Seated    General Comments        Pertinent Vitals/Pain Pain Score: 10-Worst pain ever Pain Location: R knee after exercise Pain Descriptors / Indicators: Sore;Aching Pain Intervention(s): Monitored during session;Ice applied;RN gave pain meds during session    Home Living                      Prior Function            PT Goals (current goals can now be found in the care plan section) Progress towards PT goals: Progressing toward goals    Frequency  7X/week    PT Plan Current plan remains appropriate    Co-evaluation             End of Session Equipment Utilized During Treatment: Gait belt Activity Tolerance: Patient tolerated treatment well Patient left: in bed;with call bell/phone within reach;with family/visitor present     Time: [id number] PT Time Calculation (min) (ACUTE ONLY): 24 min  Charges:  $Gait Training: 8-22 mins $Therapeutic Exercise:  8-22 mins                    G Codes:      Reginia Naas 13-Mar-2016, 11:35 AM  Magda Kiel, PT [phone] 03-13-2016

## 2016-02-18 NOTE — Evaluation (Signed)
Occupational Therapy Evaluation Patient Details Name: Maureen [name] MRN: [id number] DOB: April 08, 1956 Today's Date: 02/18/2016    History of Present Illness 60 y.o. female now s/p Rt TKA. PMH: hypertension.    Clinical Impression   Pt with decline in function and safety with ADLs and ADL mobility with decreased balance and endurance. Pt would benefit from acute OT services to address impairments to increase level of function and safety to return home. Pt's husband will be at home with her 24/7 for the first week    Follow Up Recommendations  Home health OT    Equipment Recommendations  None recommended by OT    Recommendations for Other Services       Precautions / Restrictions Precautions Precautions: Knee;Fall Precaution Comments: HEP provided, reviewed knee extension precautions Restrictions Weight Bearing Restrictions: Yes RLE Weight Bearing: Weight bearing as tolerated      Mobility Bed Mobility Overal bed mobility: Needs Assistance Bed Mobility: Sit to Supine;Supine to Sit     Supine to sit: Min guard;HOB elevated Sit to supine: Min assist   General bed mobility comments: min A with R LE onto bed  Transfers Overall transfer level: Needs assistance Equipment used: Rolling walker (2 wheeled) Transfers: Sit to/from Stand Sit to Stand: Min guard         General transfer comment: cues for correct hand placement     Balance Overall balance assessment: Needs assistance Sitting-balance support: No upper extremity supported;Feet supported Sitting balance-Leahy Scale: Good     Standing balance support: Single extremity supported;Bilateral upper extremity supported;No upper extremity supported;During functional activity Standing balance-Leahy Scale: Fair Standing balance comment: can stand without UE support, but walker for ambulation                             ADL Overall ADL's : Needs assistance/impaired     Grooming: Wash/dry hands;Wash/dry  face;Standing;Supervision/safety;Min guard   Upper Body Bathing: Set up;Supervision/ safety;Sitting   Lower Body Bathing: Moderate assistance   Upper Body Dressing : Supervision/safety;Set up;Sitting   Lower Body Dressing: Maximal assistance   Toilet Transfer: RW;Comfort height toilet;Ambulation;Grab bars;Min guard;Cueing for safety   Toileting- Clothing Manipulation and Hygiene: Minimal assistance;Sit to/from stand   Tub/ Shower Transfer: 3 in 1;Rolling walker;Ambulation;Grab bars;Cueing for safety   Functional mobility during ADLs: Min guard;Cueing for safety General ADL Comments: Pt and husband educated on ADL A/E for home use     Vision  wears glasses, no change from baseline              Pertinent Vitals/Pain Pain Assessment: 0-10 Pain Score: 5  Pain Location: R knee Pain Descriptors / Indicators: Aching Pain Intervention(s): Limited activity within patient's tolerance;Repositioned     Hand Dominance Right   Extremity/Trunk Assessment Upper Extremity Assessment Upper Extremity Assessment: Overall WFL for tasks assessed   Lower Extremity Assessment Lower Extremity Assessment: Defer to PT evaluation       Communication Communication Communication: No difficulties   Cognition Arousal/Alertness: Awake/alert Behavior During Therapy: WFL for tasks assessed/performed Overall Cognitive Status: Within Functional Limits for tasks assessed                     General Comments   pt very pleasant and cooperative, husband very supportive                 Home Living Family/patient expects to be discharged to:: Private residence Living Arrangements: Spouse/significant other Available Help  at Discharge: Available 24 hours/day Type of Home: House Home Access: Stairs to enter [employer] of Steps: 3 Entrance Stairs-Rails: Left Home Layout: Able to live on main level with bedroom/bathroom     Bathroom Shower/Tub: Radiographer, therapeutic: Standard     Home Equipment: Environmental consultant - 2 wheels;Cane - single point;Grab bars - tub/shower          Prior Functioning/Environment Level of Independence: Independent             OT Diagnosis: Acute pain   OT Problem List: Decreased activity tolerance;Impaired balance (sitting and/or standing);Pain;Decreased knowledge of use of DME or AE   OT Treatment/Interventions: Self-care/ADL training;Patient/family education;Therapeutic activities;DME and/or AE instruction    OT Goals(Current goals can be found in the care plan section) Acute Rehab OT Goals Patient Stated Goal: go home OT Goal Formulation: With patient/family Time For Goal Achievement: 02/25/16 Potential to Achieve Goals: Good ADL Goals Pt Will Perform Grooming: with supervision;standing;with set-up Pt Will Perform Upper Body Bathing: with set-up;sitting Pt Will Perform Lower Body Bathing: with min assist;sitting/lateral leans;sit to/from stand;with caregiver independent in assisting Pt Will Perform Upper Body Dressing: with set-up;sitting Pt Will Perform Lower Body Dressing: with min assist;sitting/lateral leans;sit to/from stand;with caregiver independent in assisting;with adaptive equipment Pt Will Transfer to Toilet: with supervision;ambulating;grab bars (3 in 1 over toilet) Pt Will Perform Toileting - Clothing Manipulation and hygiene: with min guard assist;with supervision;sit to/from stand Pt Will Perform Tub/Shower Transfer: with supervision;shower seat;3 in 1;grab bars  OT Frequency: Min 2X/week   Barriers to D/C:    none                     End of Session Equipment Utilized During Treatment: Rolling walker;Other (comment) (3 in 1) CPM Right Knee CPM Right Knee: Off  Activity Tolerance: Patient tolerated treatment well Patient left: in bed;with call bell/phone within reach   Time: 1249-1317 OT Time Calculation (min): 28 min Charges:  OT General Charges $OT Visit: 1 Procedure OT  Evaluation $OT Eval Moderate Complexity: 1 Procedure OT Treatments $Therapeutic Activity: 8-22 mins G-Codes:    Britt Bottom 02/18/2016, 1:55 PM

## 2016-02-19 LAB — CBC
HEMATOCRIT: 34.3 % — AB (ref 36.0–46.0)
HEMOGLOBIN: 11.2 g/dL — AB (ref 12.0–15.0)
MCH: 32.1 pg (ref 26.0–34.0)
MCHC: 32.7 g/dL (ref 30.0–36.0)
MCV: 98.3 fL (ref 78.0–100.0)
Platelets: 218 10*3/uL (ref 150–400)
RBC: 3.49 MIL/uL — ABNORMAL LOW (ref 3.87–5.11)
RDW: 13.6 % (ref 11.5–15.5)
WBC: 7.2 10*3/uL (ref 4.0–10.5)

## 2016-02-19 NOTE — Discharge Summary (Signed)
Patient ID: Maureen [name] MRN: [id number] DOB/AGE: 61-31-1957 61 y.o.  Admit date: 02/17/2016 Discharge date: 02/19/2016  Admission Diagnoses:  Principal Problem:   Primary osteoarthritis of right knee   Discharge Diagnoses:  Same  Past Medical History  Diagnosis Date  . IBS (irritable bowel syndrome)   . Allergy     seasonal  . Hypertension     HX OF HYPERTENSION PRIOR TO LAP BANDING - WAS TAKEN OFF BP MED  . Numbness     LEFT FOOT  . GERD (gastroesophageal reflux disease)   . PONV (postoperative nausea and vomiting)     PONV with gallbladder - no PONV since that procedure  . OSA (obstructive sleep apnea)     resolved with Lap Band/2nd test  . History of hiatal hernia   . Arthritis     "knees, back" (02/17/2016)    Surgeries: Procedure(s): TOTAL KNEE ARTHROPLASTY on 02/17/2016   Consultants:    Discharged Condition: Improved  Hospital Course: Maureen [name] is an 60 y.o. female who was admitted 02/17/2016 for operative treatment ofPrimary osteoarthritis of right knee. Patient has severe unremitting pain that affects sleep, daily activities, and work/hobbies. After pre-op clearance the patient was taken to the operating room on 02/17/2016 and underwent  Procedure(s): TOTAL KNEE ARTHROPLASTY.    Patient was given perioperative antibiotics: Anti-infectives    Start     Dose/Rate Route Frequency Ordered Stop   02/17/16 0824  cefUROXime (ZINACEF) injection  Status:  Discontinued       As needed 02/17/16 0825 02/17/16 0934   02/17/16 0700  ceFAZolin (ANCEF) 3 g in dextrose 5 % 50 mL IVPB     3 g 130 mL/hr over 30 Minutes Intravenous To [facility] 02/16/16 1059 02/17/16 0739       Patient was given sequential compression devices, early ambulation, and chemoprophylaxis to prevent DVT.  Patient benefited maximally from hospital stay and there were no complications.    Recent vital signs: Patient Vitals for the past 24 hrs:  BP Temp Temp src Pulse Resp SpO2   02/19/16 0557 (!) 156/76 mmHg 97.8 F (36.6 C) Oral 96 17 96 %  02/18/16 2000 (!) 121/53 mmHg 98.7 F (37.1 C) Oral 91 16 95 %  02/18/16 1300 130/62 mmHg 98.7 F (37.1 C) Oral 86 16 94 %     Recent laboratory studies:  Recent Labs  02/18/16 0403 02/19/16 0540  WBC 8.5 7.2  HGB 12.0 11.2*  HCT 37.5 34.3*  PLT 235 218  NA 134*  --   K 4.3  --   CL 100*  --   CO2 26  --   BUN 14  --   CREATININE 1.30*  --   GLUCOSE 119*  --   CALCIUM 9.1  --      Discharge Medications:     Medication List    STOP taking these medications        meloxicam 7.5 MG tablet  Commonly known as:  MOBIC      TAKE these medications        acetaminophen 500 MG tablet  Commonly known as:  TYLENOL  Take 1,000 mg by mouth every 8 (eight) hours as needed for mild pain or headache.     aspirin EC 325 MG tablet  Take 1 tablet (325 mg total) by mouth 2 (two) times daily.     hydrochlorothiazide 12.5 MG tablet  Commonly known as:  HYDRODIURIL  Take 1 tablet (12.5 mg total) by  mouth daily.     HYDROmorphone 4 MG tablet  Commonly known as:  DILAUDID  Take 0.5-1 tablets (2-4 mg total) by mouth every 4 (four) hours as needed for severe pain.     IRON (FERROUS SULFATE) PO  Take 1 tablet by mouth daily.     lisinopril 40 MG tablet  Commonly known as:  PRINIVIL,ZESTRIL  Take 1 tablet (40 mg total) by mouth daily.     loratadine 10 MG tablet  Commonly known as:  CLARITIN  Take 10 mg by mouth every evening.     Magnesium 250 MG Tabs  Take 1 tablet by mouth daily.     methocarbamol 500 MG tablet  Commonly known as:  ROBAXIN  Take 1 tablet (500 mg total) by mouth 2 (two) times daily with a meal.     multivitamin capsule  Take 1 capsule by mouth at bedtime.     omeprazole 20 MG tablet  Commonly known as:  PRILOSEC OTC  Take 20 mg by mouth daily.     venlafaxine XR 75 MG 24 hr capsule  Commonly known as:  EFFEXOR-XR  Take 1 capsule (75 mg total) by mouth daily. Take 1 capsule by mouth      VITAMIN D-3 PO  Take 2,000 Units by mouth every evening.        Diagnostic Studies: No results found.  Disposition: 01-Home or Self Care      Discharge Instructions    CPM    Complete by:  As directed   Continuous passive motion machine (CPM):      Use the CPM from 0 to 60  for 5 hours per day.      You may increase by 10 degrees per day.  You may break it up into 2 or 3 sessions per day.      Use CPM for 2 weeks or until you are told to stop.     Call MD / Call 911    Complete by:  As directed   If you experience chest pain or shortness of breath, CALL 911 and be transported to the hospital emergency room.  If you develope a fever above 101 F, pus (white drainage) or increased drainage or redness at the wound, or calf pain, call your surgeon's office.     Constipation Prevention    Complete by:  As directed   Drink plenty of fluids.  Prune juice may be helpful.  You may use a stool softener, such as Colace (over the counter) 100 mg twice a day.  Use MiraLax (over the counter) for constipation as needed.     Diet - low sodium heart healthy    Complete by:  As directed      Driving restrictions    Complete by:  As directed   No driving for 2 weeks     Increase activity slowly as tolerated    Complete by:  As directed      Patient may shower    Complete by:  As directed   You may shower without a dressing once there is no drainage.  Do not wash over the wound.  If drainage remains, cover wound with plastic wrap and then shower.           Follow-up Information    Follow up with Kerin Salen, MD In 2 weeks.   Specialty:  Orthopedic Surgery   Contact information:   [place] [address] [phone]  Signed: Barba Solt R 02/19/2016, 7:46 AM

## 2016-02-19 NOTE — Progress Notes (Signed)
Physical Therapy Treatment Patient Details Name: Maureen [name] MRN: [id number] DOB: 30-Mar-1956 Today's Date: 02/19/2016    History of Present Illness 60 y.o. female now s/p Rt TKA. PMH: hypertension.     PT Comments    Pt performed increased gait distance.  Pt required 2nd session as she remains to wait for d/c home this pm.    Follow Up Recommendations  Home health PT;Supervision for mobility/OOB     Equipment Recommendations  3in1 (PT)    Recommendations for Other Services       Precautions / Restrictions Precautions Precautions: Knee;Fall Precaution Booklet Issued: Yes (comment) Precaution Comments: HEP provided, reviewed knee extension precautions Restrictions Weight Bearing Restrictions: Yes RLE Weight Bearing: Weight bearing as tolerated    Mobility  Bed Mobility Overal bed mobility: Needs Assistance Bed Mobility: Supine to Sit     Supine to sit: Supervision Sit to supine: Supervision   General bed mobility comments: Pt required cues for positioning and advancing to [facility], able to Lift RLE into bed with leg lifter.  Pt performed supine to sit with HOB elevated and sit to supine with HOB flat and rail removed.    Transfers Overall transfer level: Needs assistance Equipment used: Rolling walker (2 wheeled) Transfers: Sit to/from Stand Sit to Stand: Supervision         General transfer comment: Cues for hand placement and forward advancement of RLE.    Ambulation/Gait Ambulation/Gait assistance: Supervision Ambulation Distance (Feet): 350 Feet Assistive device: Rolling walker (2 wheeled) Gait Pattern/deviations: Step-through pattern;Decreased stride length;Trunk flexed;Antalgic Gait velocity: decreased   General Gait Details: Cues for upper trunk control and improved gait symmetry noted with step to sequencing.     Stairs Stairs:  (Pt reports she feels confident with previous stair training.  )       General stair comments: cues for sequencing and  assist for safety; discussed with spouse how to assist  Wheelchair Mobility    Modified Rankin (Stroke Patients Only)       Balance Overall balance assessment: Needs assistance   Sitting balance-Leahy Scale: Good       Standing balance-Leahy Scale: Fair                      Cognition Arousal/Alertness: Awake/alert Behavior During Therapy: WFL for tasks assessed/performed Overall Cognitive Status: Within Functional Limits for tasks assessed                      Exercises Total Joint Exercises Ankle Circles/Pumps: AROM;Both;10 reps;Supine Quad Sets: Strengthening;10 reps;Right;Supine Gluteal Sets: Strengthening;10 reps;Right;Supine Towel Squeeze: Both;10 reps;Supine;AROM Short Arc Quad: 10 reps;Right;Supine;AAROM Heel Slides: AAROM;Right;10 reps;Supine Hip ABduction/ADduction: 10 reps;Right;Supine;AAROM Straight Leg Raises: AAROM;Right;10 reps;Supine Long Arc Quad: AAROM;Right;10 reps;Supine Knee Flexion: AAROM;AROM;10 reps;Supine (1x5 AAROM with 10 sec hold and 1x5 AROM with 10 sec hold.  ) Goniometric ROM: 0-72 degrees R knee.      General Comments        Pertinent Vitals/Pain Pain Assessment: 0-10 Pain Score: 6  Pain Location: R knee movement Pain Descriptors / Indicators: Guarding;Grimacing;Tightness Pain Intervention(s): Monitored during session;Repositioned;Ice applied    Home Living                      Prior Function            PT Goals (current goals can now be found in the care plan section) Acute Rehab PT Goals Patient Stated Goal: go home Potential to Achieve  Goals: Good Progress towards PT goals: Progressing toward goals    Frequency  7X/week    PT Plan Current plan remains appropriate    Co-evaluation             End of Session Equipment Utilized During Treatment: Gait belt Activity Tolerance: Patient tolerated treatment well Patient left: with call bell/phone within reach;with family/visitor present;in  bed     Time: [id number] PT Time Calculation (min) (ACUTE ONLY): 20 min  Charges:  $Gait Training: 8-22 mins $Therapeutic Exercise: 8-22 mins                    G Codes:      Cristela Blue 2016/03/14, 2:04 PM Governor Rooks, PTA pager [phone]

## 2016-02-19 NOTE — Progress Notes (Signed)
Physical Therapy Treatment Patient Details Name: Maureen [name] MRN: [id number] DOB: 03-Dec-1955 Today's Date: 02/19/2016    History of Present Illness 60 y.o. female now s/p Rt TKA. PMH: hypertension.     PT Comments    Pt performed increased mobility, slated for d/c this am.  Will f/u in pm if patient remains in hospital.     Follow Up Recommendations  Home health PT;Supervision for mobility/OOB     Equipment Recommendations  3in1 (PT)    Recommendations for Other Services       Precautions / Restrictions Precautions Precautions: Knee;Fall Precaution Booklet Issued: Yes (comment) Precaution Comments: HEP provided, reviewed knee extension precautions Restrictions Weight Bearing Restrictions: Yes RLE Weight Bearing: Weight bearing as tolerated    Mobility  Bed Mobility               General bed mobility comments: Pt received in recliner chair.    Transfers Overall transfer level: Needs assistance Equipment used: Rolling walker (2 wheeled) Transfers: Sit to/from Stand Sit to Stand: Supervision         General transfer comment: Cues for hand placement and forward advancement of RLE.    Ambulation/Gait Ambulation/Gait assistance: Supervision Ambulation Distance (Feet): 220 Feet Assistive device: Rolling walker (2 wheeled) Gait Pattern/deviations: Step-through pattern;Decreased stride length;Trunk flexed;Antalgic Gait velocity: decreased   General Gait Details: Cues for upper trunk control and to soften landing on L heel strike to improve gait symmetry.     Stairs Stairs:  (Pt reports she feels confident with previous stair training.  )       General stair comments: cues for sequencing and assist for safety; discussed with spouse how to assist  Wheelchair Mobility    Modified Rankin (Stroke Patients Only)       Balance Overall balance assessment: Needs assistance   Sitting balance-Leahy Scale: Good       Standing balance-Leahy Scale:  Fair                      Cognition Arousal/Alertness: Awake/alert Behavior During Therapy: WFL for tasks assessed/performed Overall Cognitive Status: Within Functional Limits for tasks assessed                      Exercises Total Joint Exercises Ankle Circles/Pumps: AROM;Both;10 reps;Supine Quad Sets: Strengthening;10 reps;Right;Supine Gluteal Sets: Strengthening;10 reps;Right;Supine Towel Squeeze: Both;10 reps;Supine;AROM Short Arc Quad: AROM;10 reps;Right;Supine Heel Slides: AAROM;Right;10 reps;Supine Hip ABduction/ADduction: 10 reps;Right;Supine;AAROM Straight Leg Raises: AAROM;Right;10 reps;Supine Long Arc Quad: AAROM;Right;10 reps;Supine Knee Flexion: AAROM;AROM;10 reps;Supine (1x5 AAROM with 10 sec hold and 1x5 AROM with 10 sec hold.  ) Goniometric ROM: 0-72 degrees R knee.      General Comments        Pertinent Vitals/Pain Pain Assessment: 0-10 Pain Score: 6  Pain Location: R knee with movement Pain Descriptors / Indicators: Grimacing;Guarding;Tender;Tightness Pain Intervention(s): Premedicated before session;Monitored during session;Repositioned;Ice applied;RN gave pain meds during session    Home Living                      Prior Function            PT Goals (current goals can now be found in the care plan section) Acute Rehab PT Goals Patient Stated Goal: go home Potential to Achieve Goals: Good Progress towards PT goals: Progressing toward goals    Frequency       PT Plan Current plan remains appropriate    Co-evaluation  End of Session Equipment Utilized During Treatment: Gait belt Activity Tolerance: Patient tolerated treatment well Patient left: with call bell/phone within reach;in chair;with family/visitor present     Time: [id number] PT Time Calculation (min) (ACUTE ONLY): 32 min  Charges:  $Gait Training: 8-22 mins $Therapeutic Exercise: 8-22 mins                    G Codes:      Cristela Blue March 08, 2016, 10:27 AM  Governor Rooks, PTA pager [phone]

## 2016-02-19 NOTE — Care Management Note (Signed)
Case Management Note  Patient Details  Name: Maureen [name] MRN: [id number] Date of Birth: [birth year]  Subjective/Objective:  60 yr old female s/p right total knee arthroplasty.                Action/Plan: Case manager spoke with patient concerning [place] and DME needs. Choice was offered for Home Health agency, patient states she was preoperatively assigned to [facility]. Patient states her husband will pick up 3in1 at the [employer] store in [place]. She will have family support at discharge.   Expected Discharge Date:    02/19/16             Expected Discharge Plan:  [place]  In-House Referral:     Discharge planning Services  CM Consult  Post Acute Care Choice:  Durable Medical Equipment, Home Health Choice offered to:  Patient  DME Arranged:  CPM DME Agency:  [employer]  HH Arranged:  PT HH Agency:  [facility]  Status of Service:  Completed, signed off  If discussed at [place] of Stay Meetings, dates discussed:    Additional Comments:  Ninfa Meeker, RN 02/19/2016, 3:46 PM

## 2016-02-19 NOTE — Progress Notes (Signed)
PATIENT ID: Maureen [name]  MRN: [id number]  DOB/AGE:  [birth year] / 60 y.o.  2 Days Post-Op Procedure(s) (LRB): TOTAL KNEE ARTHROPLASTY (Right)    PROGRESS NOTE Subjective: Patient is alert, oriented, no Nausea, no Vomiting, yes passing gas. Taking PO well. Denies SOB, Chest or Calf Pain. Using Incentive Spirometer, PAS in place. Ambulate WBAT with pt walking 180 ft and practicing stairs, CPM 0-40 Patient reports pain as mild .    Objective: Vital signs in last 24 hours: Filed Vitals:   02/18/16 0520 02/18/16 1300 02/18/16 2000 02/19/16 0557  BP: 113/58 130/62 121/53 156/76  Pulse: 82 86 91 96  Temp: 97.9 F (36.6 C) 98.7 F (37.1 C) 98.7 F (37.1 C) 97.8 F (36.6 C)  TempSrc: Oral Oral Oral Oral  Resp: 16 16 16 17   Height:      Weight:      SpO2: 94% 94% 95% 96%      Intake/Output from previous day: I/O last 3 completed shifts: In: 480 [P.O.:480] Out: -    Intake/Output this shift:     LABORATORY DATA:  Recent Labs  02/17/16 0942 02/18/16 0403 02/19/16 0540  WBC  --  8.5 7.2  HGB  --  12.0 11.2*  HCT  --  37.5 34.3*  PLT  --  235 218  NA  --  134*  --   K  --  4.3  --   CL  --  100*  --   CO2  --  26  --   BUN  --  14  --   CREATININE  --  1.30*  --   GLUCOSE  --  119*  --   GLUCAP 88  --   --   CALCIUM  --  9.1  --     Examination: Neurologically intact Neurovascular intact Sensation intact distally Intact pulses distally Dorsiflexion/Plantar flexion intact Incision: dressing C/D/I No cellulitis present Compartment soft}  Assessment:   2 Days Post-Op Procedure(s) (LRB): TOTAL KNEE ARTHROPLASTY (Right) ADDITIONAL DIAGNOSIS: Expected Acute Blood Loss Anemia, morbid obesity, hypertension, GERD, history of lap band procedure   Plan: PT/OT WBAT, CPM 5/hrs day until ROM 0-90 degrees, then D/C CPM DVT Prophylaxis:  SCDx72hrs, ASA 325 mg BID x 2 weeks DISCHARGE PLAN: Home DISCHARGE NEEDS: HHPT, CPM, Walker and 3-in-1 comode seat     Tarisa Paola,  Bambi Fehnel R 02/19/2016, 7:41 AM

## 2016-03-09 ENCOUNTER — Other Ambulatory Visit: Payer: Self-pay

## 2016-03-09 MED ORDER — LISINOPRIL 40 MG PO TABS: 40.0000 mg | ORAL_TABLET | Freq: Every day | ORAL | Status: DC

## 2016-03-11 ENCOUNTER — Ambulatory Visit: Payer: Self-pay | Admitting: Physician Assistant

## 2016-04-15 ENCOUNTER — Other Ambulatory Visit: Payer: Self-pay | Admitting: Internal Medicine

## 2016-04-20 ENCOUNTER — Other Ambulatory Visit: Payer: Self-pay | Admitting: Internal Medicine

## 2016-04-23 ENCOUNTER — Other Ambulatory Visit: Payer: Self-pay

## 2016-04-23 MED ORDER — MELOXICAM 15 MG PO TABS: ORAL_TABLET | ORAL | 1 refills | Status: DC

## 2016-04-28 ENCOUNTER — Encounter: Payer: Self-pay | Admitting: Internal Medicine

## 2016-04-28 ENCOUNTER — Ambulatory Visit (INDEPENDENT_AMBULATORY_CARE_PROVIDER_SITE_OTHER): Payer: 59 | Admitting: Internal Medicine

## 2016-04-28 ENCOUNTER — Other Ambulatory Visit: Payer: Self-pay | Admitting: Internal Medicine

## 2016-04-28 VITALS — BP 118/66 | HR 78 | Temp 98.2°F | Resp 16 | Ht 61.0 in | Wt 226.0 lb

## 2016-04-28 DIAGNOSIS — E785 Hyperlipidemia, unspecified: Secondary | ICD-10-CM

## 2016-04-28 DIAGNOSIS — Z96653 Presence of artificial knee joint, bilateral: Secondary | ICD-10-CM

## 2016-04-28 DIAGNOSIS — Z79899 Other long term (current) drug therapy: Secondary | ICD-10-CM

## 2016-04-28 DIAGNOSIS — R7303 Prediabetes: Secondary | ICD-10-CM | POA: Diagnosis not present

## 2016-04-28 DIAGNOSIS — E559 Vitamin D deficiency, unspecified: Secondary | ICD-10-CM

## 2016-04-28 DIAGNOSIS — I10 Essential (primary) hypertension: Secondary | ICD-10-CM | POA: Diagnosis not present

## 2016-04-28 MED ORDER — LISINOPRIL 40 MG PO TABS: 40.0000 mg | ORAL_TABLET | Freq: Every day | ORAL | 0 refills | Status: DC

## 2016-04-28 MED ORDER — VENLAFAXINE HCL ER 75 MG PO CP24: 75.0000 mg | ORAL_CAPSULE | Freq: Every day | ORAL | 0 refills | Status: DC

## 2016-04-28 MED ORDER — HYDROCHLOROTHIAZIDE 12.5 MG PO TABS: 12.5000 mg | ORAL_TABLET | Freq: Every day | ORAL | 1 refills | Status: DC

## 2016-04-28 NOTE — Progress Notes (Signed)
Assessment and Plan:  Hypertension:  -Continue medication,  -monitor blood pressure at home.  -Continue DASH diet.   -Reminder to go to the ER if any CP, SOB, nausea, dizziness, severe HA, changes vision/speech, left arm numbness and tingling, and jaw pain.  Cholesterol: -Continue diet and exercise.  -Check cholesterol.   Pre-diabetes: -Continue diet and exercise.  -Check A1C  Vitamin D Def: -check level -continue medications.  -recommended trying Viactive to determine whether this will cause less GI upset.    Status post total knee -doing extremely well -encourage to continue her PT exercises  Morbid obesity -successful 10 lb weight loss -cont diet and exercise  Labs done at [facility].    Continue diet and meds as discussed. Further disposition pending results of labs.  HPI 60 y.o. female  presents for 3 month follow up with hypertension, hyperlipidemia, prediabetes and vitamin D.   Her blood pressure has been controlled at home, today their BP is BP: 118/66.   She does workout. She denies chest pain, shortness of breath, dizziness.   She is on cholesterol medication and denies myalgias. Her cholesterol is at goal. The cholesterol last visit was:   Lab Results  Component Value Date   CHOL 195 07/30/2015   HDL 71 07/30/2015   LDLCALC 76 07/30/2015   TRIG 239 (H) 07/30/2015     She has been working on diet and exercise for prediabetes, and denies foot ulcerations, hyperglycemia, hypoglycemia , increased appetite, nausea, paresthesia of the feet, polydipsia, polyuria, visual disturbances, vomiting and weight loss. Last A1C in the office was:  Lab Results  Component Value Date   HGBA1C 5.8 (H) 12/05/2015    Patient is not on Vitamin D supplement.  Lab Results  Component Value Date   VD25OH 46.9 12/05/2015     She reports that her knee replacement is doing really well.  She reports that she is exercising and walking and doing yard work.     Current Medications:   Current Outpatient Prescriptions on File Prior to Visit  Medication Sig Dispense Refill  . Cholecalciferol (VITAMIN D-3 PO) Take 2,000 Units by mouth every evening.     . hydrochlorothiazide (HYDRODIURIL) 12.5 MG tablet Take 1 tablet (12.5 mg total) by mouth daily. 90 tablet 1  . IRON, FERROUS SULFATE, PO Take 1 tablet by mouth daily.    Marland Kitchen lisinopril (PRINIVIL,ZESTRIL) 40 MG tablet Take 1 tablet (40 mg total) by mouth daily. 90 tablet 0  . loratadine (CLARITIN) 10 MG tablet Take 10 mg by mouth every evening.     . Magnesium 250 MG TABS Take 1 tablet by mouth daily.    . meloxicam (MOBIC) 15 MG tablet Take 1/2 to 1 tablet daily with food for pain & inflammation 90 tablet 1  . Multiple Vitamin (MULTIVITAMIN) capsule Take 1 capsule by mouth at bedtime.     Marland Kitchen omeprazole (PRILOSEC OTC) 20 MG tablet Take 20 mg by mouth daily.    Marland Kitchen venlafaxine XR (EFFEXOR-XR) 75 MG 24 hr capsule Take 1 capsule by mouth  daily 90 capsule 0   No current facility-administered medications on file prior to visit.     Medical History:  Past Medical History:  Diagnosis Date  . Allergy    seasonal  . Arthritis    "knees, back" (02/17/2016)  . GERD (gastroesophageal reflux disease)   . History of hiatal hernia   . Hypertension    HX OF HYPERTENSION PRIOR TO LAP BANDING - WAS TAKEN OFF BP MED  .  IBS (irritable bowel syndrome)   . Numbness    LEFT FOOT  . OSA (obstructive sleep apnea)    resolved with Lap Band/2nd test  . PONV (postoperative nausea and vomiting)    PONV with gallbladder - no PONV since that procedure    Allergies:  Allergies  Allergen Reactions  . Mucinex [Guaifenesin Er] Other (See Comments)    "feels weird and dizzy"  . Tramadol Itching     Review of Systems:  Review of Systems  Constitutional: Negative for chills, fever and malaise/fatigue.  HENT: Negative for congestion, ear pain and sore throat.   Eyes: Negative.   Respiratory: Negative for cough, shortness of breath and  wheezing.   Cardiovascular: Negative for chest pain, palpitations and leg swelling.  Gastrointestinal: Negative for abdominal pain, blood in stool, constipation, diarrhea, heartburn and melena.  Genitourinary: Negative.   Skin: Negative.   Neurological: Negative for dizziness, sensory change, loss of consciousness and headaches.  Psychiatric/Behavioral: Negative for depression. The patient is not nervous/anxious and does not have insomnia.     Family history- Review and unchanged  Social history- Review and unchanged  Physical Exam: BP 118/66   Pulse 78   Temp 98.2 F (36.8 C) (Temporal)   Resp 16   Ht 5\' 1"  (1.549 m)   Wt 226 lb (102.5 kg)   BMI 42.70 kg/m  Wt Readings from Last 3 Encounters:  04/28/16 226 lb (102.5 kg)  02/17/16 236 lb (107 kg)  02/07/16 236 lb (107 kg)    General Appearance: Well nourished well developed, in no apparent distress. Eyes: PERRLA, EOMs, conjunctiva no swelling or erythema ENT/Mouth: Ear canals normal without obstruction, swelling, erythma, discharge.  TMs normal bilaterally.  Oropharynx moist, clear, without exudate, or postoropharyngeal swelling. Neck: Supple, thyroid normal,no cervical adenopathy  Respiratory: Respiratory effort normal, Breath sounds clear A&P without rhonchi, wheeze, or rale.  No retractions, no accessory usage. Cardio: RRR with no MRGs. Brisk peripheral pulses without edema.  Abdomen: Soft, + BS,  Non tender, no guarding, rebound, hernias, masses. Musculoskeletal: Full ROM, 5/5 strength, Normal gait Skin: Warm, dry without rashes, lesions, ecchymosis.  Neuro: Awake and oriented X 3, Cranial nerves intact. Normal muscle tone, no cerebellar symptoms. Psych: Normal affect, Insight and Judgment appropriate.    Starlyn Skeans, PA-C 11:50 AM [facility]

## 2016-04-29 LAB — CBC WITH DIFFERENTIAL/PLATELET
BASOS ABS: 0 10*3/uL (ref 0.0–0.2)
BASOS: 0 %
EOS (ABSOLUTE): 0.1 10*3/uL (ref 0.0–0.4)
Eos: 2 %
Hematocrit: 41.1 % (ref 34.0–46.6)
Hemoglobin: 13.5 g/dL (ref 11.1–15.9)
Immature Grans (Abs): 0 10*3/uL (ref 0.0–0.1)
Immature Granulocytes: 0 %
LYMPHS ABS: 1.8 10*3/uL (ref 0.7–3.1)
LYMPHS: 30 %
MCH: 32.7 pg (ref 26.6–33.0)
MCHC: 32.8 g/dL (ref 31.5–35.7)
MCV: 100 fL — AB (ref 79–97)
MONOS ABS: 0.4 10*3/uL (ref 0.1–0.9)
Monocytes: 6 %
NEUTROS ABS: 3.7 10*3/uL (ref 1.4–7.0)
Neutrophils: 62 %
PLATELETS: 263 10*3/uL (ref 150–379)
RBC: 4.13 x10E6/uL (ref 3.77–5.28)
RDW: 13.4 % (ref 12.3–15.4)
WBC: 6.1 10*3/uL (ref 3.4–10.8)

## 2016-04-29 LAB — HEPATIC FUNCTION PANEL
ALK PHOS: 95 IU/L (ref 39–117)
ALT: 23 IU/L (ref 0–32)
AST: 16 IU/L (ref 0–40)
Albumin: 4.1 g/dL (ref 3.6–4.8)
BILIRUBIN, DIRECT: 0.09 mg/dL (ref 0.00–0.40)
Bilirubin Total: 0.3 mg/dL (ref 0.0–1.2)
TOTAL PROTEIN: 6.5 g/dL (ref 6.0–8.5)

## 2016-04-29 LAB — BASIC METABOLIC PANEL
BUN / CREAT RATIO: 32 — AB (ref 12–28)
BUN: 26 mg/dL (ref 8–27)
CHLORIDE: 98 mmol/L (ref 96–106)
CO2: 24 mmol/L (ref 18–29)
Calcium: 9.5 mg/dL (ref 8.7–10.3)
Creatinine, Ser: 0.82 mg/dL (ref 0.57–1.00)
GFR calc non Af Amer: 77 mL/min/{1.73_m2} (ref >59)
GFR, EST AFRICAN AMERICAN: 89 mL/min/{1.73_m2} (ref >59)
GLUCOSE: 159 mg/dL — AB (ref 65–99)
Potassium: 4.3 mmol/L (ref 3.5–5.2)
SODIUM: 139 mmol/L (ref 134–144)

## 2016-04-29 LAB — VITAMIN D 25 HYDROXY (VIT D DEFICIENCY, FRACTURES): Vit D, 25-Hydroxy: 62.9 ng/mL (ref 30.0–100.0)

## 2016-04-29 LAB — HGB A1C W/O EAG: HEMOGLOBIN A1C: 5.3 % (ref 4.8–5.6)

## 2016-04-29 LAB — TSH: TSH: 2.15 u[IU]/mL (ref 0.450–4.500)

## 2016-06-15 ENCOUNTER — Other Ambulatory Visit: Payer: Self-pay | Admitting: Physician Assistant

## 2016-07-04 ENCOUNTER — Other Ambulatory Visit: Payer: Self-pay | Admitting: Internal Medicine

## 2016-09-07 IMAGING — RF DG ESOPHAGUS
13 series · 13 of 13 positions shown · non-contrast
Comparison: Preoperative upper GI series dated June 27, 2009 and
post- operative lap band barium swallow examination November 06, 2009

CLINICAL DATA: Assess lap band position and presence of hiatal
hernia

EXAM:
ESOPHOGRAM / BARIUM SWALLOW / BARIUM TABLET STUDY
TECHNIQUE: Combined double contrast and single contrast examination performed
using effervescent crystals, thick barium liquid, and thin barium
liquid. The patient was observed with fluoroscopy swallowing a 13mm
barium sulphate tablet.
FLUOROSCOPY TIME:  2 minutes, 6 seconds ; 55 dGy cm2

[Series 1: run · 1 of 1 slices shown (1 of 13)]
[im 1/1]
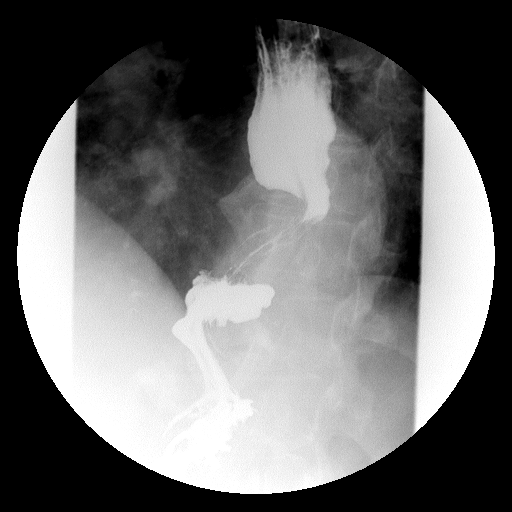

[Series 2: run · 1 of 1 slices shown (2 of 13)]
[im 1/1]
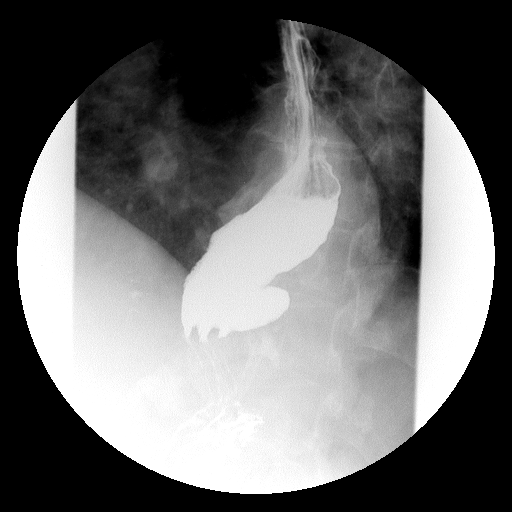

[Series 3: run · 1 of 1 slices shown (3 of 13)]
[im 1/1]
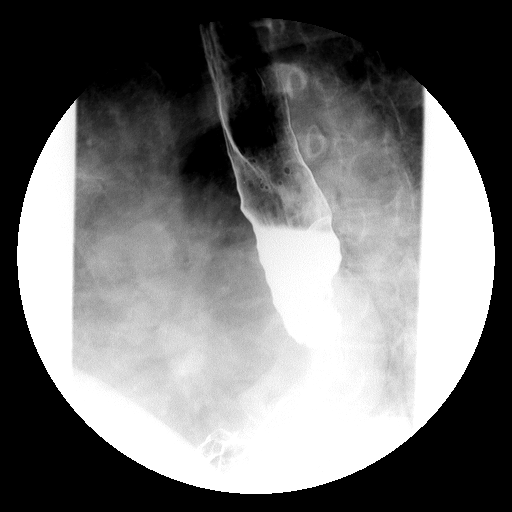

[Series 4: run · 1 of 1 slices shown (4 of 13)]
[im 1/1]
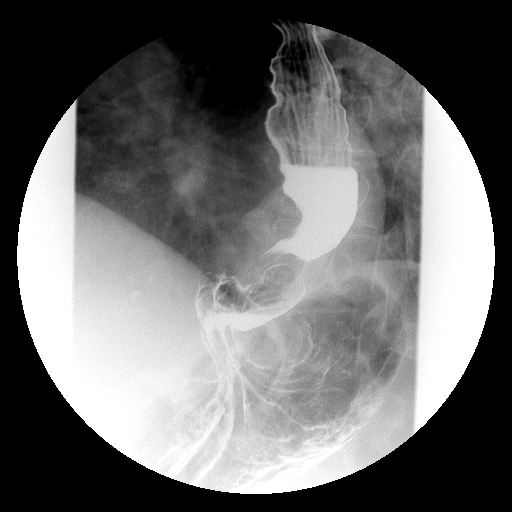

[Series 5: run · 1 of 1 slices shown (5 of 13)]
[im 1/1]
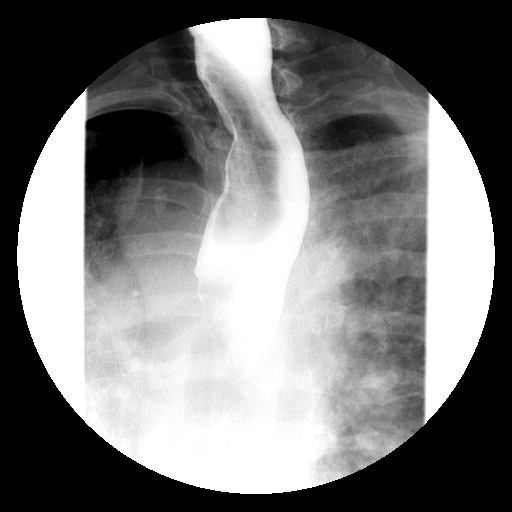

[Series 6: run · 1 of 1 slices shown (6 of 13)]
[im 1/1]
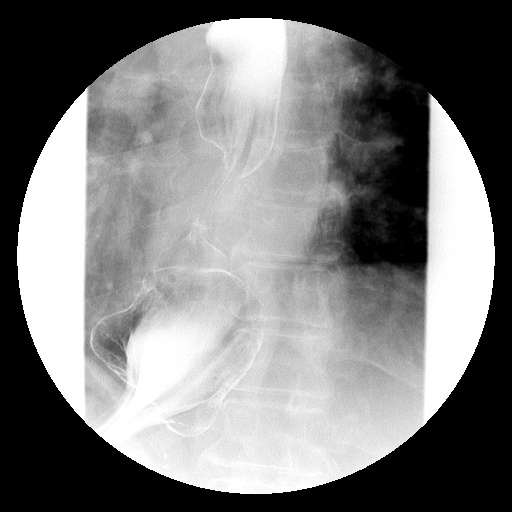

[Series 7: run · 1 of 1 slices shown (7 of 13)]
[im 1/1]
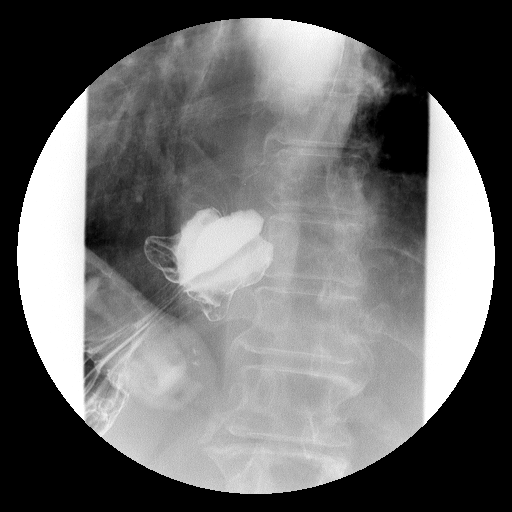

[Series 8: run · 1 of 1 slices shown (8 of 13)]
[im 1/1]
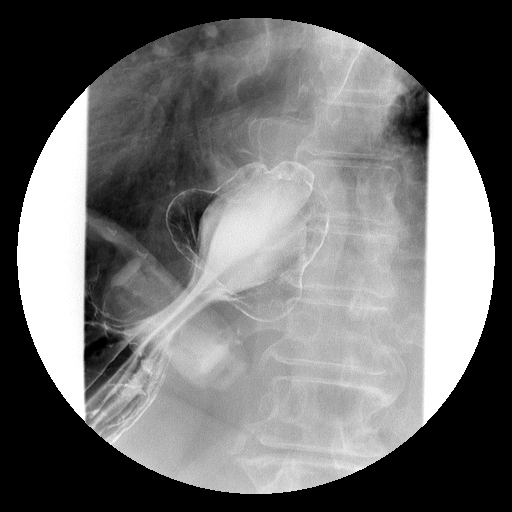

[Series 9: run · 1 of 1 slices shown (9 of 13)]
[im 1/1]
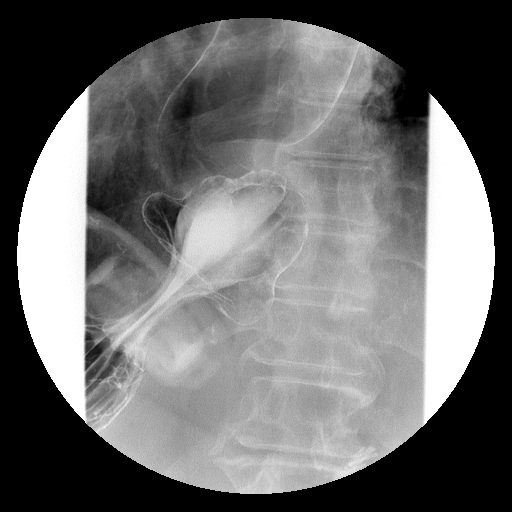

[Series 10: run · 1 of 1 slices shown (10 of 13)]
[im 1/1]
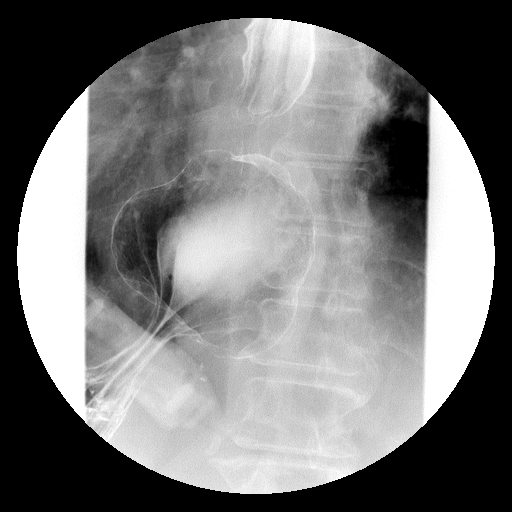

[Series 11: run · 1 of 1 slices shown (11 of 13)]
[im 1/1]
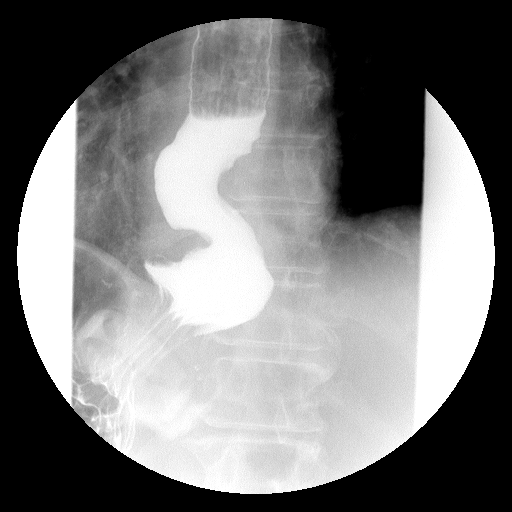

[Series 12: run · 1 of 1 slices shown (12 of 13)]
[im 1/1]
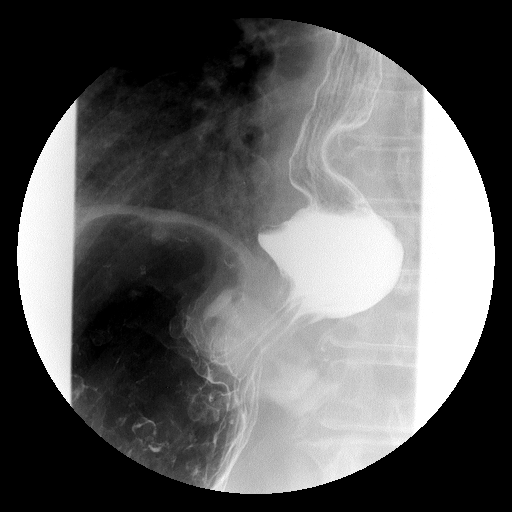

[Series 13: run · 1 of 1 slices shown (13 of 13)]
[im 1/1]
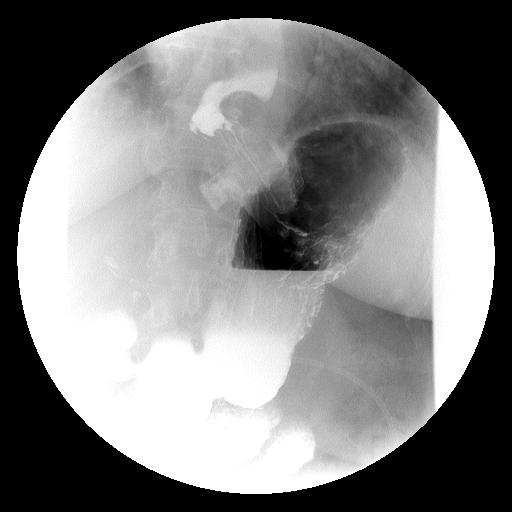

[13 of 13 positions shown; findings below may reference images not displayed]

FINDINGS: The patient ingested the thick and thin barium and the gas-forming
crystals without difficulty. The cervical esophagus was unremarkable
with exception of a cricopharyngeus muscle impression. There was no
laryngeal penetration of the barium.

The thoracic esophagus was patulous. There were mild changes of
presbyesophagus. A moderate-sized hiatal hernia was demonstrated
above the area of the laparoscopic band. Barium passed from the
esophagus into the hiatal hernia and then into the stomach through
the decompressed lap band without difficulty. No reflux was observed
with change in patient positioning. In the prone position
propagation of the primary and secondary peristaltic waves through
the esophagus was poor.

The 13 mm barium tablet passed promptly from the mouth into the
hiatal hernia.
IMPRESSION: 1. Interval development of a moderate-sized hiatal hernia above the
level of the laparoscopic band. There is no obstruction to passage
of barium from the esophagus into the hiatal hernia or through the
decompressed laparoscopic band into the stomach. No ulceration was
observed.
2. There are changes of presbyesophagus with poor propagation of the
bold peristaltic waves in the prone position.

## 2016-09-19 ENCOUNTER — Other Ambulatory Visit: Payer: Self-pay | Admitting: Internal Medicine

## 2016-09-23 ENCOUNTER — Other Ambulatory Visit: Payer: Self-pay | Admitting: Internal Medicine

## 2016-09-30 ENCOUNTER — Ambulatory Visit: Payer: Self-pay | Admitting: Physician Assistant

## 2016-10-07 ENCOUNTER — Encounter: Payer: Self-pay | Admitting: Physician Assistant

## 2016-10-07 ENCOUNTER — Ambulatory Visit (INDEPENDENT_AMBULATORY_CARE_PROVIDER_SITE_OTHER): Payer: 59 | Admitting: Physician Assistant

## 2016-10-07 VITALS — BP 136/70 | HR 86 | Temp 97.3°F | Resp 16 | Ht 61.0 in | Wt 242.2 lb

## 2016-10-07 DIAGNOSIS — E559 Vitamin D deficiency, unspecified: Secondary | ICD-10-CM

## 2016-10-07 DIAGNOSIS — E785 Hyperlipidemia, unspecified: Secondary | ICD-10-CM | POA: Diagnosis not present

## 2016-10-07 DIAGNOSIS — I10 Essential (primary) hypertension: Secondary | ICD-10-CM | POA: Diagnosis not present

## 2016-10-07 DIAGNOSIS — R7303 Prediabetes: Secondary | ICD-10-CM | POA: Diagnosis not present

## 2016-10-07 DIAGNOSIS — Z79899 Other long term (current) drug therapy: Secondary | ICD-10-CM

## 2016-10-07 NOTE — Patient Instructions (Addendum)
Simple math prevails.    1st - exercise does not produce significant weight loss - at best one converts fat into muscle , "bulks up", loses inches, but usually stays "weight neutral"     2nd - think of your body weightas a check book: If you eat more calories than you burn up - you save money or gain weight .... Or if you spend more money than you put in the check book, ie burn up more calories than you eat, then you lose weight     3rd - if you walk or run 1 mile, you burn up 100 calories - you have to burn up 3,500 calories to lose 1 pound, ie you have to walk/run 35 miles to lose 1 measly pound. So if you want to lose 10 #, then you have to walk/run 350 miles, so.... clearly exercise is not the solution.     4. So if you consume 1,500 calories, then you have to burn up the equivalent of 15 miles to stay weight neutral - It also stands to reason that if you consume 1,500 cal/day and don't lose weight, then you must be burning up about 1,500 cals/day to stay weight neutral.     5. If you really want to lose weight, you must cut your calorie intake 300 calories /day and at that rate you should lose about 1 # every 3 days.   6. Please purchase Dr Joel Fuhrman's book(s) "The End of Dieting" & "Eat to Live" . It has some great concepts and recipes.      Before you even begin to attack a weight-loss plan, it pays to remember this: You are not fat. You have fat. Losing weight isn't about blame or shame; it's simply another achievement to accomplish. Dieting is like any other skill-you have to buckle down and work at it. As long as you act in a smart, reasonable way, you'll ultimately get where you want to be. Here are some weight loss pearls for you.  1. It's Not a Diet. It's a Lifestyle Thinking of a diet as something you're on and suffering through only for the short term doesn't work. To shed weight and keep it off, you need to make permanent changes to the way you eat. It's OK to indulge  occasionally, of course, but if you cut calories temporarily and then revert to your old way of eating, you'll gain back the weight quicker than you can say yo-yo. Use it to lose it. Research shows that one of the best predictors of long-term weight loss is how many pounds you drop in the first month. For that reason, nutritionists often suggest being stricter for the first two weeks of your new eating strategy to build momentum. Cut out added sugar and alcohol and avoid unrefined carbs. After that, figure out how you can reincorporate them in a way that's healthy and maintainable.  2. There's a Right Way to Exercise Working out burns calories and fat and boosts your metabolism by building muscle. But those trying to lose weight are notorious for overestimating the number of calories they burn and underestimating the amount they take in. Unfortunately, your system is biologically programmed to hold on to extra pounds and that means when you start exercising, your body senses the deficit and ramps up its hunger signals. If you're not diligent, you'll eat everything you burn and then some. Use it to lose it. Cardio gets all the exercise glory, but strength and interval   training are the real heroes. They help you build lean muscle, which in turn increases your metabolism and calorie-burning ability 3. Don't Overreact to Mild Hunger Some people have a hard time losing weight because of hunger anxiety. To them, being hungry is bad-something to be avoided at all costs-so they carry snacks with them and eat when they don't need to. Others eat because they're stressed out or bored. While you never want to get to the point of being ravenous (that's when bingeing is likely to happen), a hunger pang, a craving, or the fact that it's 3:00 p.m. should not send you racing for the vending machine or obsessing about the energy bar in your purse. Ideally, you should put off eating until your stomach is growling and it's  difficult to concentrate.  Use it to lose it. When you feel the urge to eat, use the HALT method. Ask yourself, Am I really hungry? Or am I angry or anxious, lonely or bored, or tired? If you're still not certain, try the apple test. If you're truly hungry, an apple should seem delicious; if it doesn't, something else is going on. Or you can try drinking water and making yourself busy, if you are still hungry try a healthy snack.  4. Not All Calories Are Created Equal The mechanics of weight loss are pretty simple: Take in fewer calories than you use for energy. But the kind of food you eat makes all the difference. Processed food that's high in saturated fat and refined starch or sugar can cause inflammation that disrupts the hormone signals that tell your brain you're full. The result: You eat a lot more.  Use it to lose it. Clean up your diet. Swap in whole, unprocessed foods, including vegetables, lean protein, and healthy fats that will fill you up and give you the biggest nutritional bang for your calorie buck. In a few weeks, as your brain starts receiving regular hunger and fullness signals once again, you'll notice that you feel less hungry overall and naturally start cutting back on the amount you eat.  5. Protein, Produce, and Plant-Based Fats Are Your Weight-Loss Trinity Here's why eating the three Ps regularly will help you drop pounds. Protein fills you up. You need it to build lean muscle, which keeps your metabolism humming so that you can torch more fat. People in a weight-loss program who ate double the recommended daily allowance for protein (about 110 grams for a 150-pound woman) lost 70 percent of their weight from fat, while people who ate the RDA lost only about 40 percent, one study found. Produce is packed with filling fiber. "It's very difficult to consume too many calories if you're eating a lot of vegetables. Example: Three cups of broccoli is a lot of food, yet only 93 calories.  (Fruit is another story. It can be easy to overeat and can contain a lot of calories from sugar, so be sure to monitor your intake.) Plant-based fats like olive oil and those in avocados and nuts are healthy and extra satiating.  Use it to lose it. Aim to incorporate each of the three Ps into every meal and snack. People who eat protein throughout the day are able to keep weight off, according to a study in the American Journal of Clinical Nutrition. In addition to meat, poultry and seafood, good sources are beans, lentils, eggs, tofu, and yogurt. As for fat, keep portion sizes in check by measuring out salad dressing, oil, and nut butters (shoot   for one to two tablespoons). Finally, eat veggies or a little fruit at every meal. People who did that consumed 308 fewer calories but didn't feel any hungrier than when they didn't eat more produce.  7. How You Eat Is As Important As What You Eat In order for your brain to register that you're full, you need to focus on what you're eating. Sit down whenever you eat, preferably at a table. Turn off the TV or computer, put down your phone, and look at your food. Smell it. Chew slowly, and don't put another bite on your fork until you swallow. When women ate lunch this attentively, they consumed 30 percent less when snacking later than those who listened to an audiobook at lunchtime, according to a study in the [employer] Journal of Nutrition. 8. Weighing Yourself Really Works The scale provides the best evidence about whether your efforts are paying off. Seeing the numbers tick up or down or stagnate is motivation to keep going-or to rethink your approach. A 2015 study at [employer] found that daily weigh-ins helped people lose more weight, keep it off, and maintain that loss, even after two years. Use it to lose it. Step on the scale at the same time every day for the best results. If your weight shoots up several pounds from one weigh-in to the next, don't  freak out. Eating a lot of salt the night before or having your period is the likely culprit. The number should return to normal in a day or two. It's a steady climb that you need to do something about. 9. Too Much Stress and Too Little Sleep Are Your Enemies When you're tired and frazzled, your body cranks up the production of cortisol, the stress hormone that can cause carb cravings. Not getting enough sleep also boosts your levels of ghrelin, a hormone associated with hunger, while suppressing leptin, a hormone that signals fullness and satiety. People on a diet who slept only five and a half hours a night for two weeks lost 55 percent less fat and were hungrier than those who slept eight and a half hours, according to a study in the [employer] Journal. Use it to lose it. Prioritize sleep, aiming for seven hours or more a night, which research shows helps lower stress. And make sure you're getting quality zzz's. If a snoring spouse or a fidgety cat wakes you up frequently throughout the night, you may end up getting the equivalent of just four hours of sleep, according to a study from [employer]. Keep pets out of the bedroom, and use a white-noise app to drown out snoring. 10. You Will Hit a plateau-And You Can Bust Through It As you slim down, your body releases much less leptin, the fullness hormone.  If you're not strength training, start right now. Building muscle can raise your metabolism to help you overcome a plateau. To keep your body challenged and burning calories, incorporate new moves and more intense intervals into your workouts or add another sweat session to your weekly routine. Alternatively, cut an extra 100 calories or so a day from your diet. Now that you've lost weight, your body simply doesn't need as much fuel.   

## 2016-10-07 NOTE — Progress Notes (Signed)
Assessment and Plan:   Hypertension -Continue medication, monitor blood pressure at home. Continue DASH diet.  Reminder to go to the ER if any CP, SOB, nausea, dizziness, severe HA, changes vision/speech, left arm numbness and tingling and jaw pain.   Cholesterol -Continue diet and exercise. Check cholesterol.   Prediabetes  -Continue diet and exercise. Check A1C   Vitamin D Def - check level and continue medications.    Obesity with co morbidities - long discussion about weight loss, diet, and exercise   Continue diet and meds as discussed. Further disposition pending results of labs Over 30 minutes of exam, counseling, chart review, and critical decision making was performed [facility] labs done Future Appointments Date Time Provider [place]  01/28/2017 3:00 PM [name], PA-C [id number] None     HPI 61 y.o. female  presents for 3 month follow up on hypertension, cholesterol, prediabetes, and vitamin D deficiency.   Her blood pressure has been controlled at home, today their BP is BP: 136/70  She does workout. She denies chest pain, shortness of breath, dizziness.  She is not on cholesterol medication and denies myalgias. Her cholesterol is at goal. The cholesterol last visit was:   Lab Results  Component Value Date   CHOL 195 07/30/2015   HDL 71 07/30/2015   LDLCALC 76 07/30/2015   TRIG 239 (H) 07/30/2015    She has been working on diet and exercise for prediabetes, and denies paresthesia of the feet, polydipsia, polyuria and visual disturbances. Last A1C in the office was:  Lab Results  Component Value Date   HGBA1C 5.3 04/28/2016   Patient is on Vitamin D supplement.   Lab Results  Component Value Date   VD25OH 62.9 04/28/2016     BMI is Body mass index is 45.76 kg/m., she is working on diet and exercise. Patient has had lap band repair due to GERD with Dr. Hassell Done in May.  Wt Readings from Last 3 Encounters:  10/07/16 242 lb 3.2 oz (109.9 kg)   04/28/16 226 lb (102.5 kg)  02/17/16 236 lb (107 kg)    Current Medications:  Current Outpatient Prescriptions on File Prior to Visit  Medication Sig Dispense Refill  . Cholecalciferol (VITAMIN D-3 PO) Take 2,000 Units by mouth every evening.     . hydrochlorothiazide (HYDRODIURIL) 12.5 MG tablet Take 1 tablet (12.5 mg total) by mouth daily. 90 tablet 1  . IRON, FERROUS SULFATE, PO Take 1 tablet by mouth daily.    Marland Kitchen lisinopril (PRINIVIL,ZESTRIL) 40 MG tablet TAKE ONE TABLET BY MOUTH ONCE DAILY 90 tablet 0  . loratadine (CLARITIN) 10 MG tablet Take 10 mg by mouth every evening.     . meloxicam (MOBIC) 15 MG tablet Take 1/2 to 1 tablet daily with food for pain & inflammation 90 tablet 1  . Multiple Vitamin (MULTIVITAMIN) capsule Take 1 capsule by mouth at bedtime.     Marland Kitchen omeprazole (PRILOSEC OTC) 20 MG tablet Take 20 mg by mouth daily.    Marland Kitchen venlafaxine XR (EFFEXOR-XR) 75 MG 24 hr capsule TAKE 1 CAPSULE BY MOUTH  DAILY 90 capsule 0   No current facility-administered medications on file prior to visit.    Medical History:  Past Medical History:  Diagnosis Date  . Allergy    seasonal  . Arthritis    "knees, back" (02/17/2016)  . GERD (gastroesophageal reflux disease)   . History of hiatal hernia   . Hypertension    HX OF HYPERTENSION PRIOR TO LAP  BANDING - WAS TAKEN OFF BP MED  . IBS (irritable bowel syndrome)   . Numbness    LEFT FOOT  . OSA (obstructive sleep apnea)    resolved with Lap Band/2nd test  . PONV (postoperative nausea and vomiting)    PONV with gallbladder - no PONV since that procedure   Allergies:  Allergies  Allergen Reactions  . Mucinex [Guaifenesin Er] Other (See Comments)    "feels weird and dizzy"  . Tramadol Itching     Review of Systems:  Review of Systems  Constitutional: Negative.  Negative for chills and fever.  HENT: Negative.   Eyes: Negative.   Respiratory: Negative.   Cardiovascular: Negative.   Gastrointestinal: Negative.    Genitourinary: Negative.  Negative for frequency, hematuria and urgency.  Musculoskeletal: Positive for back pain. Negative for falls, joint pain, myalgias and neck pain.  Skin: Negative.   Neurological: Positive for tingling and sensory change. Negative for dizziness, tremors, speech change, focal weakness, seizures and loss of consciousness.  Endo/Heme/Allergies: Negative.   Psychiatric/Behavioral: Negative.     Family history- Review and unchanged Social history- Review and unchanged Physical Exam: BP 136/70   Pulse 86   Temp 97.3 F (36.3 C)   Resp 16   Ht 5\' 1"  (1.549 m)   Wt 242 lb 3.2 oz (109.9 kg)   SpO2 98%   BMI 45.76 kg/m  Wt Readings from Last 3 Encounters:  10/07/16 242 lb 3.2 oz (109.9 kg)  04/28/16 226 lb (102.5 kg)  02/17/16 236 lb (107 kg)   General Appearance: Well nourished, in no apparent distress. Eyes: PERRLA, EOMs, conjunctiva no swelling or erythema Sinuses: No Frontal/maxillary tenderness ENT/Mouth: Ext aud canals clear, TMs without erythema, bulging. No erythema, swelling, or exudate on post pharynx.  Tonsils not swollen or erythematous. Hearing normal.  Neck: Supple, thyroid normal.  Respiratory: Respiratory effort normal, BS equal bilaterally without rales, rhonchi, wheezing or stridor.  Cardio: RRR with no MRGs. Brisk peripheral pulses without edema.  Abdomen: Soft, + BS,  Non tender, no guarding, rebound, hernias, masses. Lymphatics: Non tender without lymphadenopathy.  Musculoskeletal: Full ROM, 5/5 strength, Patient is able to ambulate well. Gait is  Antalgic. Straight leg raising with dorsiflexion negative bilaterally for radicular symptoms. Sensory exam in the legs are normal. Knee reflexes are normal Ankle reflexes are normal Strength is normal and symmetric in arms and legs. There is not SI tenderness to palpation.  There is notparaspinal muscle spasm.  There is not midline tenderness.  ROM of spine with  limited in all spheres due to pain.   Skin: Warm, dry without rashes, lesions, ecchymosis.  Neuro: Cranial nerves intact. Normal muscle tone, no cerebellar symptoms. Psych: Awake and oriented X 3, normal affect, Insight and Judgment appropriate.    [name], PA-C 4:22 PM [facility]

## 2016-10-29 ENCOUNTER — Other Ambulatory Visit: Payer: Self-pay | Admitting: Physician Assistant

## 2016-11-02 ENCOUNTER — Other Ambulatory Visit: Payer: Self-pay | Admitting: Physician Assistant

## 2016-11-02 DIAGNOSIS — E785 Hyperlipidemia, unspecified: Secondary | ICD-10-CM | POA: Diagnosis not present

## 2016-11-02 DIAGNOSIS — I10 Essential (primary) hypertension: Secondary | ICD-10-CM | POA: Diagnosis not present

## 2016-11-03 LAB — CBC WITH DIFFERENTIAL/PLATELET
BASOS ABS: 0 10*3/uL (ref 0.0–0.2)
Basos: 0 %
EOS (ABSOLUTE): 0.1 10*3/uL (ref 0.0–0.4)
Eos: 2 %
Hematocrit: 41.8 % (ref 34.0–46.6)
Hemoglobin: 13.8 g/dL (ref 11.1–15.9)
Immature Grans (Abs): 0 10*3/uL (ref 0.0–0.1)
Immature Granulocytes: 0 %
LYMPHS ABS: 2.2 10*3/uL (ref 0.7–3.1)
Lymphs: 34 %
MCH: 32.3 pg (ref 26.6–33.0)
MCHC: 33 g/dL (ref 31.5–35.7)
MCV: 98 fL — ABNORMAL HIGH (ref 79–97)
MONOS ABS: 0.4 10*3/uL (ref 0.1–0.9)
Monocytes: 7 %
NEUTROS ABS: 3.7 10*3/uL (ref 1.4–7.0)
Neutrophils: 57 %
Platelets: 253 10*3/uL (ref 150–379)
RBC: 4.27 x10E6/uL (ref 3.77–5.28)
RDW: 13.3 % (ref 12.3–15.4)
WBC: 6.5 10*3/uL (ref 3.4–10.8)

## 2016-11-03 LAB — RENAL FUNCTION PANEL
ALBUMIN: 4.4 g/dL (ref 3.6–4.8)
BUN / CREAT RATIO: 33 — AB (ref 12–28)
BUN: 25 mg/dL (ref 8–27)
CHLORIDE: 96 mmol/L (ref 96–106)
CO2: 25 mmol/L (ref 18–29)
Calcium: 10 mg/dL (ref 8.7–10.3)
Creatinine, Ser: 0.76 mg/dL (ref 0.57–1.00)
GFR, EST AFRICAN AMERICAN: 98 mL/min/{1.73_m2} (ref >59)
GFR, EST NON AFRICAN AMERICAN: 85 mL/min/{1.73_m2} (ref >59)
GLUCOSE: 141 mg/dL — AB (ref 65–99)
POTASSIUM: 4.6 mmol/L (ref 3.5–5.2)
Phosphorus: 3.6 mg/dL (ref 2.5–4.5)
Sodium: 138 mmol/L (ref 134–144)

## 2016-11-03 LAB — LIPID PANEL W/O CHOL/HDL RATIO
Cholesterol, Total: 213 mg/dL — ABNORMAL HIGH (ref 100–199)
HDL: 64 mg/dL (ref >39)
LDL Calculated: 96 mg/dL (ref 0–99)
Triglycerides: 265 mg/dL — ABNORMAL HIGH (ref 0–149)
VLDL CHOLESTEROL CAL: 53 mg/dL — AB (ref 5–40)

## 2016-11-03 LAB — TSH: TSH: 3.14 u[IU]/mL (ref 0.450–4.500)

## 2016-11-03 NOTE — Progress Notes (Signed)
Pt aware of lab results & voiced understanding of those results.

## 2016-11-10 DIAGNOSIS — Z01419 Encounter for gynecological examination (general) (routine) without abnormal findings: Secondary | ICD-10-CM | POA: Diagnosis not present

## 2016-11-10 DIAGNOSIS — Z1231 Encounter for screening mammogram for malignant neoplasm of breast: Secondary | ICD-10-CM | POA: Diagnosis not present

## 2016-11-17 DIAGNOSIS — L820 Inflamed seborrheic keratosis: Secondary | ICD-10-CM | POA: Diagnosis not present

## 2016-11-17 DIAGNOSIS — D1800 Hemangioma unspecified site: Secondary | ICD-10-CM | POA: Diagnosis not present

## 2016-11-17 DIAGNOSIS — D229 Melanocytic nevi, unspecified: Secondary | ICD-10-CM | POA: Diagnosis not present

## 2016-11-17 DIAGNOSIS — Z1283 Encounter for screening for malignant neoplasm of skin: Secondary | ICD-10-CM | POA: Diagnosis not present

## 2016-12-03 ENCOUNTER — Encounter: Payer: Self-pay | Admitting: Physician Assistant

## 2016-12-12 ENCOUNTER — Other Ambulatory Visit: Payer: Self-pay | Admitting: Internal Medicine

## 2016-12-19 ENCOUNTER — Other Ambulatory Visit: Payer: Self-pay | Admitting: Internal Medicine

## 2016-12-31 DIAGNOSIS — M1712 Unilateral primary osteoarthritis, left knee: Secondary | ICD-10-CM | POA: Diagnosis not present

## 2016-12-31 DIAGNOSIS — M25561 Pain in right knee: Secondary | ICD-10-CM | POA: Diagnosis not present

## 2017-01-27 NOTE — Progress Notes (Signed)
Complete Physical  Assessment and Plan: Essential hypertension - INCREASE LISINOPRIL TO 2 A DAY OF THE 20/12.5 - continue medications, DASH diet, exercise and monitor at home. Call if greater than 130/80.  - EKG 12-Lead  Prediabetes Discussed general issues about diabetes pathophysiology and management., Educational material distributed., Suggested low cholesterol diet., Encouraged aerobic exercise., Discussed foot care., Reminded to get yearly retinal exam.   Hyperlipidemia -continue medications, check lipids, decrease fatty foods, increase activity.   Morbid obesity Obesity with co morbidities- long discussion about weight loss, diet, and exercise   LAP-BAND surgery status Follow up Dr. Hassell Done   Asthma, unspecified asthma severity, uncomplicated Asthma controlled   IBS (irritable bowel syndrome) Diet controlled   Gastroesophageal reflux disease with esophagitis Suppose to be evaluated for H/H   Arthritis RICE, NSAIDS, exercises given, if not better get xray and PT referral or ortho referral.   Allergy, subsequent encounter Continue OTC allergy pills   Vitamin D deficiency Continue supplement   Medication management   Discussed med's effects and SE's. Screening labs and tests as requested with regular follow-up as recommended.Gets labs done at [facility]  HPI 61 y.o. female  presents for a complete physical. Her blood pressure has been controlled at home, runs 120's at home, she was started on lisinopril with fluid pill in it 1 week ago, today their BP is BP: 128/76. She had her She denies chest pain, shortness of breath, dizziness.  Her cholesterol is diet controlled. Her cholesterol is controlled. The cholesterol last visit was:  Lab Results  Component Value Date   CHOL 213 (H) 11/02/2016   HDL 64 11/02/2016   LDLCALC 96 11/02/2016   TRIG 265 (H) 11/02/2016  She has been working on diet and exercise for prediabetes, and denies blurry vision, polydipsia,  polyphagia and polyuria. Last A1C in the office was: Lab Results  Component Value Date   HGBA1C 5.3 04/28/2016  Patient is on Vitamin D supplements.  Lab Results  Component Value Date   VD25OH 62.9 04/28/2016  She is on protonix for GERD. She is on effexor for IBS/diarrhea, she feels that it is not helping but would like to stay on it to prevent hot flashes.  Works at [employer], gets labs there.   She has lower back pain with sciatica left leg occ, on meloxicam.  She is s/p lap band, follows with [facility]. Frustrated with weight loss, trying keto and eating lots of cheese. Needs to lose 25 lbs before gets other left knee replacement, right knee better.  Wt Readings from Last 3 Encounters:  01/28/17 240 lb 12.8 oz (109.2 kg)  10/07/16 242 lb 3.2 oz (109.9 kg)  04/28/16 226 lb (102.5 kg)    Current Medications:  Current Outpatient Prescriptions on File Prior to Visit  Medication Sig Dispense Refill  . Cholecalciferol (VITAMIN D-3 PO) Take 2,000 Units by mouth every evening.     . hydrochlorothiazide (HYDRODIURIL) 12.5 MG tablet Take 1 tablet (12.5 mg total) by mouth daily. 90 tablet 1  . IRON PO Take by mouth.    . IRON, FERROUS SULFATE, PO Take 1 tablet by mouth daily.    Marland Kitchen lisinopril (PRINIVIL,ZESTRIL) 40 MG tablet TAKE ONE TABLET BY MOUTH ONCE DAILY 90 tablet 1  . loratadine (CLARITIN) 10 MG tablet Take 10 mg by mouth every evening.     . meloxicam (MOBIC) 15 MG tablet TAKE ONE-HALF TO ONE TABLET BY MOUTH ONCE DAILY WITH FOOD FOR  PAIN  AND  INFLAMMATION 90 tablet 1  . Multiple Vitamin (MULTIVITAMIN) capsule Take 1 capsule by mouth at bedtime.     Marland Kitchen omeprazole (PRILOSEC OTC) 20 MG tablet Take 20 mg by mouth daily.    Marland Kitchen venlafaxine XR (EFFEXOR-XR) 75 MG 24 hr capsule TAKE 1 CAPSULE BY MOUTH  DAILY 90 capsule 0   No current facility-administered medications on file prior to visit.    Health Maintenance:  Immunization History  Administered Date(s) Administered  .  Influenza-Unspecified 07/11/2013  . Td 04/28/2006  . Tdap 12/04/2015   Tetanus: 2017 Pneumovax:N/A Prevnar 13: due at 65 Flu vaccine: 07/2016 at [facility] Zostavax: N/A Pap: 2017 MGM: 10/2015 3D neg +FBD at [facility]- (Dr. Redmond Pulling)  Right breast U/S 12/24/2013 NEGATIVE DEXA: 2010 normal- non smoker, no family history, BMI elevated, no broken bones Colonoscopy: 2009 normal due 2019 (Dr. Collene Mares) , hemoccult at Dr. Dellis Filbert EGD: N/A Myoview stress test 10/2001 AB [place] 2003 DEE 01/2015 Dr. Gaylyn Cheers Dentist: Dr. Carman Ching in [place], q 6 months Derm Dr. Nicole Kindred, [place]  Medical History:  Past Medical History:  Diagnosis Date  . Allergy    seasonal  . Arthritis    "knees, back" (02/17/2016)  . GERD (gastroesophageal reflux disease)   . History of hiatal hernia   . Hypertension    HX OF HYPERTENSION PRIOR TO LAP BANDING - WAS TAKEN OFF BP MED  . IBS (irritable bowel syndrome)   . Numbness    LEFT FOOT  . OSA (obstructive sleep apnea)    resolved with Lap Band/2nd test  . PONV (postoperative nausea and vomiting)    PONV with gallbladder - no PONV since that procedure   Allergies Allergies  Allergen Reactions  . Mucinex [Guaifenesin Er] Other (See Comments)    "feels weird and dizzy"  . 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 FAMILY HISTORY Her family history includes Heart disease in her father; Hyperlipidemia in her father. SOCIAL HISTORY She  reports that she  quit smoking about 18 years ago. Her smoking use included Cigarettes. She has a 7.00 pack-year smoking history. She has never used smokeless tobacco. She reports that she does not drink alcohol or use drugs.  Review of Systems  Constitutional: Negative.   HENT: Negative for congestion, ear discharge, ear pain, hearing loss, nosebleeds, sore throat and tinnitus.   Eyes: Negative.   Respiratory: Negative for cough, hemoptysis, sputum production, shortness of breath, wheezing and stridor.   Cardiovascular: Negative.   Gastrointestinal: Positive for diarrhea. Negative for abdominal pain, blood in stool, constipation, heartburn, melena, nausea and vomiting.  Genitourinary: Negative.   Musculoskeletal: Positive for back pain (lower back pain with pain into her left leg) and joint pain (left knee). Negative for falls, myalgias and neck pain.  Skin: Negative.   Neurological: Negative.  Negative for headaches.  Endo/Heme/Allergies: Negative.   Psychiatric/Behavioral: Negative.     Physical Exam: Blood pressure 128/76, pulse 91, temperature 98.2 F (36.8 C), resp. rate 14, height 5\' 1"  (1.549 m), weight 240 lb 12.8 oz (109.2 kg), SpO2 98 %. Body mass index is 45.5 kg/m. General Appearance: Well nourished, in no apparent distress. Eyes:  PERRLA, EOMs, conjunctiva no swelling or erythema, normal fundi and vessels. Sinuses: No Frontal/maxillary tenderness ENT/Mouth: Ext aud canals clear, normal light reflex with TMs without erythema, bulging.  Good dentition. No erythema, swelling, or exudate on post pharynx. Tonsils not swollen or erythematous. Hearing normal.  Neck: Supple, thyroid normal. No bruits Respiratory: Respiratory effort normal, BS equal bilaterally without rales, rhonchi, wheezing or stridor. Cardio: RRR without murmurs, rubs or gallops. Brisk peripheral pulses without edema.  Chest: symmetric, with normal excursions and percussion. Breasts: defer Abdomen: Soft, +BS, obese,  notenderness, no guarding, rebound, hernias, masses, or organomegaly. .  Lymphatics: Non tender without lymphadenopathy.  Genitourinary: defer Musculoskeletal: Full ROM all peripheral extremities,5/5 strength, and antalgic gait. Skin: Warm, dry without rashes, lesions, ecchymosis.  Neuro: Cranial nerves intact, reflexes equal bilaterally. Normal muscle tone, no cerebellar symptoms. Sensation intact.  Psych: Awake and oriented X 3, normal affect, Insight and Judgment appropriate.   EKG: WNL no changes.    [name] 3:35 PM

## 2017-01-28 ENCOUNTER — Ambulatory Visit (INDEPENDENT_AMBULATORY_CARE_PROVIDER_SITE_OTHER): Payer: 59 | Admitting: Physician Assistant

## 2017-01-28 ENCOUNTER — Encounter: Payer: Self-pay | Admitting: Physician Assistant

## 2017-01-28 VITALS — BP 128/76 | HR 91 | Temp 98.2°F | Resp 14 | Ht 61.0 in | Wt 240.8 lb

## 2017-01-28 DIAGNOSIS — E785 Hyperlipidemia, unspecified: Secondary | ICD-10-CM

## 2017-01-28 DIAGNOSIS — K589 Irritable bowel syndrome without diarrhea: Secondary | ICD-10-CM

## 2017-01-28 DIAGNOSIS — K2100 Gastro-esophageal reflux disease with esophagitis, without bleeding: Secondary | ICD-10-CM

## 2017-01-28 DIAGNOSIS — Z136 Encounter for screening for cardiovascular disorders: Secondary | ICD-10-CM | POA: Diagnosis not present

## 2017-01-28 DIAGNOSIS — Z0000 Encounter for general adult medical examination without abnormal findings: Secondary | ICD-10-CM

## 2017-01-28 DIAGNOSIS — E559 Vitamin D deficiency, unspecified: Secondary | ICD-10-CM

## 2017-01-28 DIAGNOSIS — I10 Essential (primary) hypertension: Secondary | ICD-10-CM

## 2017-01-28 DIAGNOSIS — N393 Stress incontinence (female) (male): Secondary | ICD-10-CM

## 2017-01-28 DIAGNOSIS — R7303 Prediabetes: Secondary | ICD-10-CM

## 2017-01-28 DIAGNOSIS — J3089 Other allergic rhinitis: Secondary | ICD-10-CM

## 2017-01-28 DIAGNOSIS — Z9884 Bariatric surgery status: Secondary | ICD-10-CM

## 2017-01-28 DIAGNOSIS — Z79899 Other long term (current) drug therapy: Secondary | ICD-10-CM

## 2017-01-28 DIAGNOSIS — M1711 Unilateral primary osteoarthritis, right knee: Secondary | ICD-10-CM

## 2017-01-28 DIAGNOSIS — J45909 Unspecified asthma, uncomplicated: Secondary | ICD-10-CM

## 2017-01-28 DIAGNOSIS — D649 Anemia, unspecified: Secondary | ICD-10-CM

## 2017-01-28 NOTE — Patient Instructions (Signed)
Look at whole 30 rather than keto Eliminate cheese Drink 80-100 oz of water   Before you even begin to attack a weight-loss plan, it pays to remember this: You are not fat. You have fat. Losing weight isn't about blame or shame; it's simply another achievement to accomplish. Dieting is like any other skill-you have to buckle down and work at it. As long as you act in a smart, reasonable way, you'll ultimately get where you want to be. Here are some weight loss pearls for you.  1. It's Not a Diet. It's a Lifestyle Thinking of a diet as something you're on and suffering through only for the short term doesn't work. To shed weight and keep it off, you need to make permanent changes to the way you eat. It's OK to indulge occasionally, of course, but if you cut calories temporarily and then revert to your old way of eating, you'll gain back the weight quicker than you can say yo-yo. Use it to lose it. Research shows that one of the best predictors of long-term weight loss is how many pounds you drop in the first month. For that reason, nutritionists often suggest being stricter for the first two weeks of your new eating strategy to build momentum. Cut out added sugar and alcohol and avoid unrefined carbs. After that, figure out how you can reincorporate them in a way that's healthy and maintainable.  2. There's a Right Way to Exercise Working out burns calories and fat and boosts your metabolism by building muscle. But those trying to lose weight are notorious for overestimating the number of calories they burn and underestimating the amount they take in. Unfortunately, your system is biologically programmed to hold on to extra pounds and that means when you start exercising, your body senses the deficit and ramps up its hunger signals. If you're not diligent, you'll eat everything you burn and then some. Use it to lose it. Cardio gets all the exercise glory, but strength and interval training are the real  heroes. They help you build lean muscle, which in turn increases your metabolism and calorie-burning ability 3. Don't Overreact to Mild Hunger Some people have a hard time losing weight because of hunger anxiety. To them, being hungry is bad-something to be avoided at all costs-so they carry snacks with them and eat when they don't need to. Others eat because they're stressed out or bored. While you never want to get to the point of being ravenous (that's when bingeing is likely to happen), a hunger pang, a craving, or the fact that it's 3:00 p.m. should not send you racing for the vending machine or obsessing about the energy bar in your purse. Ideally, you should put off eating until your stomach is growling and it's difficult to concentrate.  Use it to lose it. When you feel the urge to eat, use the HALT method. Ask yourself, Am I really hungry? Or am I angry or anxious, lonely or bored, or tired? If you're still not certain, try the apple test. If you're truly hungry, an apple should seem delicious; if it doesn't, something else is going on. Or you can try drinking water and making yourself busy, if you are still hungry try a healthy snack.  4. Not All Calories Are Created Equal The mechanics of weight loss are pretty simple: Take in fewer calories than you use for energy. But the kind of food you eat makes all the difference. Processed food that's high in saturated  fat and refined starch or sugar can cause inflammation that disrupts the hormone signals that tell your brain you're full. The result: You eat a lot more.  Use it to lose it. Clean up your diet. Swap in whole, unprocessed foods, including vegetables, lean protein, and healthy fats that will fill you up and give you the biggest nutritional bang for your calorie buck. In a few weeks, as your brain starts receiving regular hunger and fullness signals once again, you'll notice that you feel less hungry overall and naturally start cutting back on  the amount you eat.  5. Protein, Produce, and Plant-Based Fats Are Your Weight-Loss Trinity Here's why eating the three Ps regularly will help you drop pounds. Protein fills you up. You need it to build lean muscle, which keeps your metabolism humming so that you can torch more fat. People in a weight-loss program who ate double the recommended daily allowance for protein (about 110 grams for a 150-pound woman) lost 70 percent of their weight from fat, while people who ate the RDA lost only about 40 percent, one study found. Produce is packed with filling fiber. "It's very difficult to consume too many calories if you're eating a lot of vegetables. Example: Three cups of broccoli is a lot of food, yet only 93 calories. (Fruit is another story. It can be easy to overeat and can contain a lot of calories from sugar, so be sure to monitor your intake.) Plant-based fats like olive oil and those in avocados and nuts are healthy and extra satiating.  Use it to lose it. Aim to incorporate each of the three Ps into every meal and snack. People who eat protein throughout the day are able to keep weight off, according to a study in the [place] of Clinical Nutrition. In addition to meat, poultry and seafood, good sources are beans, lentils, eggs, tofu, and yogurt. As for fat, keep portion sizes in check by measuring out salad dressing, oil, and nut butters (shoot for one to two tablespoons). Finally, eat veggies or a little fruit at every meal. People who did that consumed 308 fewer calories but didn't feel any hungrier than when they didn't eat more produce.  7. How You Eat Is As Important As What You Eat In order for your brain to register that you're full, you need to focus on what you're eating. Sit down whenever you eat, preferably at a table. Turn off the TV or computer, put down your phone, and look at your food. Smell it. Chew slowly, and don't put another bite on your fork until you swallow. When  women ate lunch this attentively, they consumed 30 percent less when snacking later than those who listened to an audiobook at lunchtime, according to a study in the [place] of Nutrition. 8. Weighing Yourself Really Works The scale provides the best evidence about whether your efforts are paying off. Seeing the numbers tick up or down or stagnate is motivation to keep going-or to rethink your approach. A 2015 study at [facility] found that daily weigh-ins helped people lose more weight, keep it off, and maintain that loss, even after two years. Use it to lose it. Step on the scale at the same time every day for the best results. If your weight shoots up several pounds from one weigh-in to the next, don't freak out. Eating a lot of salt the night before or having your period is the likely culprit. The number should return to normal  in a day or two. It's a steady climb that you need to do something about. 9. Too Much Stress and Too Little Sleep Are Your Enemies When you're tired and frazzled, your body cranks up the production of cortisol, the stress hormone that can cause carb cravings. Not getting enough sleep also boosts your levels of ghrelin, a hormone associated with hunger, while suppressing leptin, a hormone that signals fullness and satiety. People on a diet who slept only five and a half hours a night for two weeks lost 55 percent less fat and were hungrier than those who slept eight and a half hours, according to a study in the [place]. Use it to lose it. Prioritize sleep, aiming for seven hours or more a night, which research shows helps lower stress. And make sure you're getting quality zzz's. If a snoring spouse or a fidgety cat wakes you up frequently throughout the night, you may end up getting the equivalent of just four hours of sleep, according to a study from [facility]. Keep pets out of the bedroom, and use a white-noise app to drown out  snoring. 10. You Will Hit a plateau-And You Can Bust Through It As you slim down, your body releases much less leptin, the fullness hormone.  If you're not strength training, start right now. Building muscle can raise your metabolism to help you overcome a plateau. To keep your body challenged and burning calories, incorporate new moves and more intense intervals into your workouts or add another sweat session to your weekly routine. Alternatively, cut an extra 100 calories or so a day from your diet. Now that you've lost weight, your body simply doesn't need as much fuel.

## 2017-01-29 ENCOUNTER — Other Ambulatory Visit: Payer: Self-pay | Admitting: Physician Assistant

## 2017-01-29 DIAGNOSIS — E559 Vitamin D deficiency, unspecified: Secondary | ICD-10-CM | POA: Diagnosis not present

## 2017-01-29 DIAGNOSIS — Z79899 Other long term (current) drug therapy: Secondary | ICD-10-CM | POA: Diagnosis not present

## 2017-01-29 DIAGNOSIS — E785 Hyperlipidemia, unspecified: Secondary | ICD-10-CM | POA: Diagnosis not present

## 2017-01-30 LAB — MAGNESIUM: Magnesium: 2 mg/dL (ref 1.6–2.3)

## 2017-01-30 LAB — LIPID PANEL W/O CHOL/HDL RATIO
Cholesterol, Total: 156 mg/dL (ref 100–199)
HDL: 67 mg/dL (ref >39)
LDL CALC: 58 mg/dL (ref 0–99)
Triglycerides: 153 mg/dL — ABNORMAL HIGH (ref 0–149)
VLDL CHOLESTEROL CAL: 31 mg/dL (ref 5–40)

## 2017-01-30 LAB — VITAMIN D 25 HYDROXY (VIT D DEFICIENCY, FRACTURES): VIT D 25 HYDROXY: 44.2 ng/mL (ref 30.0–100.0)

## 2017-01-30 LAB — MICROALBUMIN / CREATININE URINE RATIO
CREATININE, UR: 192.1 mg/dL
MICROALBUM., U, RANDOM: 25.6 ug/mL
Microalb/Creat Ratio: 13.3 mg/g creat (ref 0.0–30.0)

## 2017-01-30 LAB — INSULIN, RANDOM: INSULIN: 14 u[IU]/mL (ref 2.6–24.9)

## 2017-01-30 LAB — CBC WITH DIFFERENTIAL/PLATELET
BASOS: 0 %
Basophils Absolute: 0 10*3/uL (ref 0.0–0.2)
EOS (ABSOLUTE): 0.2 10*3/uL (ref 0.0–0.4)
EOS: 3 %
HEMOGLOBIN: 13.9 g/dL (ref 11.1–15.9)
Hematocrit: 40.1 % (ref 34.0–46.6)
IMMATURE GRANS (ABS): 0 10*3/uL (ref 0.0–0.1)
IMMATURE GRANULOCYTES: 0 %
Lymphocytes Absolute: 1.5 10*3/uL (ref 0.7–3.1)
Lymphs: 23 %
MCH: 33.7 pg — ABNORMAL HIGH (ref 26.6–33.0)
MCHC: 34.7 g/dL (ref 31.5–35.7)
MCV: 97 fL (ref 79–97)
MONOCYTES: 5 %
Monocytes Absolute: 0.3 10*3/uL (ref 0.1–0.9)
NEUTROS ABS: 4.6 10*3/uL (ref 1.4–7.0)
NEUTROS PCT: 69 %
PLATELETS: 233 10*3/uL (ref 150–379)
RBC: 4.12 x10E6/uL (ref 3.77–5.28)
RDW: 13.6 % (ref 12.3–15.4)
WBC: 6.6 10*3/uL (ref 3.4–10.8)

## 2017-01-30 LAB — HEPATIC FUNCTION PANEL
ALK PHOS: 74 IU/L (ref 39–117)
ALT: 34 IU/L — ABNORMAL HIGH (ref 0–32)
AST: 19 IU/L (ref 0–40)
Albumin: 4.3 g/dL (ref 3.6–4.8)
Bilirubin Total: 0.4 mg/dL (ref 0.0–1.2)
Bilirubin, Direct: 0.13 mg/dL (ref 0.00–0.40)
TOTAL PROTEIN: 6.5 g/dL (ref 6.0–8.5)

## 2017-01-30 LAB — BASIC METABOLIC PANEL
BUN/Creatinine Ratio: 22 (ref 12–28)
BUN: 16 mg/dL (ref 8–27)
CALCIUM: 9.9 mg/dL (ref 8.7–10.3)
CHLORIDE: 103 mmol/L (ref 96–106)
CO2: 24 mmol/L (ref 18–29)
CREATININE: 0.73 mg/dL (ref 0.57–1.00)
GFR, EST AFRICAN AMERICAN: 102 mL/min/{1.73_m2} (ref >59)
GFR, EST NON AFRICAN AMERICAN: 89 mL/min/{1.73_m2} (ref >59)
Glucose: 110 mg/dL — ABNORMAL HIGH (ref 65–99)
Potassium: 4.7 mmol/L (ref 3.5–5.2)
Sodium: 144 mmol/L (ref 134–144)

## 2017-01-30 LAB — HGB A1C W/O EAG: Hgb A1c MFr Bld: 5.4 % (ref 4.8–5.6)

## 2017-01-30 LAB — TSH: TSH: 3.24 u[IU]/mL (ref 0.450–4.500)

## 2017-02-02 NOTE — Progress Notes (Signed)
LVM for pt to return office call for LAB results.

## 2017-02-03 ENCOUNTER — Telehealth: Payer: Self-pay | Admitting: Physician Assistant

## 2017-02-03 MED ORDER — CIPROFLOXACIN HCL 500 MG PO TABS
500.0000 mg | ORAL_TABLET | Freq: Two times a day (BID) | ORAL | 0 refills | Status: DC
Start: 2017-02-03 — End: 2017-05-24

## 2017-02-03 NOTE — Telephone Encounter (Signed)
-----   Message from Elenor Quinones, [place] sent at 02/03/2017  8:50 AM EDT ----- Regarding: Poss UTI Pt reports she had blood w/ clots in her urine on 5th June 2018, had some pain but not a lot.  Today the pt reports no clots or blood in the urine. Would like something called into pharmacy.    Please advise.

## 2017-02-03 NOTE — Progress Notes (Signed)
Pt aware of lab results & voiced understanding of those results.

## 2017-02-03 NOTE — Telephone Encounter (Signed)
Will send in cipro, however if not better in 2 days may not be appropriate ABX, will not know without a urine. With clots like that, schedule OV 1 month for follow up or may need to send to urologist. S/p hysterectomy, + former smoker Q 2000.   Need CPE in the books

## 2017-02-03 NOTE — Telephone Encounter (Signed)
LVM FOR PT TO RETURN OFFICE CALL & TO INFORM PT THAT RX WAS SENT TO [facility]

## 2017-02-04 NOTE — Telephone Encounter (Signed)
Spoke with & she states she is doing so much better today & thinks she may have passed a kidney stone but states that she has a follow up appt in 38mths & will call if she does have any issues before then.

## 2017-03-03 ENCOUNTER — Other Ambulatory Visit: Payer: Self-pay | Admitting: Internal Medicine

## 2017-03-12 ENCOUNTER — Other Ambulatory Visit: Payer: Self-pay | Admitting: Physician Assistant

## 2017-03-12 MED ORDER — OMEPRAZOLE 40 MG PO CPDR: 40.0000 mg | DELAYED_RELEASE_CAPSULE | Freq: Every day | ORAL | 1 refills | Status: DC

## 2017-03-15 ENCOUNTER — Telehealth: Payer: Self-pay | Admitting: Physician Assistant

## 2017-03-15 ENCOUNTER — Other Ambulatory Visit: Payer: Self-pay

## 2017-03-15 MED ORDER — PHENTERMINE HCL 37.5 MG PO TABS: 37.5000 mg | ORAL_TABLET | Freq: Every day | ORAL | 2 refills | Status: DC

## 2017-03-15 MED ORDER — OMEPRAZOLE 40 MG PO CPDR: 40.0000 mg | DELAYED_RELEASE_CAPSULE | Freq: Every day | ORAL | 1 refills | Status: DC

## 2017-03-15 NOTE — Telephone Encounter (Signed)
Pt would like [facility]. LVM for pt to return call.

## 2017-03-15 NOTE — Telephone Encounter (Signed)
-----   Message from Elenor Quinones, [place] sent at 03/15/2017 11:27 AM EDT ----- Regarding: RX REQUEST PT WOULD LIKE A RX FOR PHENTERMINE.  PT DID HAVE ONE QUESTION & THAT WAS IF THE DIET PILL WOULD HURT HER STOMACH?

## 2017-03-16 NOTE — Telephone Encounter (Signed)
Informed pt XI:[id number] can cause dry mouth and constipation, so increase fluids and add fiber, it can also cause increased heart rate/anxiety, stop if this happens.  Pt voiced understanding & hung up.

## 2017-04-01 DIAGNOSIS — Z9884 Bariatric surgery status: Secondary | ICD-10-CM | POA: Diagnosis not present

## 2017-04-07 ENCOUNTER — Other Ambulatory Visit: Payer: Self-pay | Admitting: Surgery

## 2017-04-07 DIAGNOSIS — K9509 Other complications of gastric band procedure: Secondary | ICD-10-CM

## 2017-04-12 DIAGNOSIS — H2513 Age-related nuclear cataract, bilateral: Secondary | ICD-10-CM | POA: Diagnosis not present

## 2017-04-13 ENCOUNTER — Ambulatory Visit
Admission: RE | Admit: 2017-04-13 | Discharge: 2017-04-13 | Disposition: A | Discharge status: Home / Self Care | Payer: 59 | Source: Ambulatory Visit | Attending: Surgery | Admitting: Surgery

## 2017-04-13 DIAGNOSIS — K9509 Other complications of gastric band procedure: Secondary | ICD-10-CM

## 2017-04-13 DIAGNOSIS — K219 Gastro-esophageal reflux disease without esophagitis: Secondary | ICD-10-CM | POA: Diagnosis not present

## 2017-04-22 DIAGNOSIS — Z9884 Bariatric surgery status: Secondary | ICD-10-CM | POA: Diagnosis not present

## 2017-04-22 DIAGNOSIS — K219 Gastro-esophageal reflux disease without esophagitis: Secondary | ICD-10-CM | POA: Diagnosis not present

## 2017-04-29 DIAGNOSIS — M545 Low back pain: Secondary | ICD-10-CM | POA: Diagnosis not present

## 2017-04-29 DIAGNOSIS — M7061 Trochanteric bursitis, right hip: Secondary | ICD-10-CM | POA: Diagnosis not present

## 2017-04-30 ENCOUNTER — Other Ambulatory Visit: Payer: Self-pay | Admitting: Internal Medicine

## 2017-05-21 DIAGNOSIS — M1712 Unilateral primary osteoarthritis, left knee: Secondary | ICD-10-CM | POA: Diagnosis not present

## 2017-05-21 NOTE — Progress Notes (Signed)
Assessment and Plan:   Hypertension -Continue medication, monitor blood pressure at home. Continue DASH diet.  Reminder to go to the ER if any CP, SOB, nausea, dizziness, severe HA, changes vision/speech, left arm numbness and tingling and jaw pain.   Cholesterol -Continue diet and exercise. Check cholesterol.   Prediabetes  -Continue diet and exercise. Check A1C   Vitamin D Def - check level and continue medications.    Obesity with co morbidities - long discussion about weight loss, diet, and exercise  Surgical clearance EKG 12/2016, no SOB/CP Monitor BP before and after and suggest DVT prophylaxis.   Continue diet and meds as discussed. Further disposition pending results of labs Over 30 minutes of exam, counseling, chart review, and critical decision making was performed LAB [facility] labs done Future Appointments Date Time Provider [place]  02/02/2018 3:00 PM [name], PA-C [id number] None     HPI 61 y.o. female  presents for 3 month follow up on hypertension, cholesterol, prediabetes, and vitamin D deficiency.   Her blood pressure has been controlled at home, today their BP is BP: 130/80  She does workout. She denies chest pain, shortness of breath, dizziness. Getting cataract surgery bilateral in Oct 10 and 24th.  Left knee replacement in Nov.  She is having left groin pain, tender to touch, burning/stingling pain.   She is not on cholesterol medication and denies myalgias. Her cholesterol is at goal. The cholesterol last visit was:   Lab Results  Component Value Date   CHOL 156 01/29/2017   HDL 67 01/29/2017   LDLCALC 58 01/29/2017   TRIG 153 (H) 01/29/2017    She has been working on diet and exercise for prediabetes, and denies paresthesia of the feet, polydipsia, polyuria and visual disturbances. Last A1C in the office was:  Lab Results  Component Value Date   HGBA1C 5.4 01/29/2017   Patient is on Vitamin D supplement.   Lab Results  Component  Value Date   VD25OH 44.2 01/29/2017     BMI is Body mass index is 44.02 kg/m., she is working on diet and exercise. Patient has had lap band repair due to GERD with Dr. Hassell Done in May. Has Hiatal hernia so took fluid out of band.  Wt Readings from Last 3 Encounters:  05/24/17 233 lb (105.7 kg)  01/28/17 240 lb 12.8 oz (109.2 kg)  10/07/16 242 lb 3.2 oz (109.9 kg)    Current Medications:  Current Outpatient Prescriptions on File Prior to Visit  Medication Sig Dispense Refill  . hydrochlorothiazide (HYDRODIURIL) 12.5 MG tablet Take 1 tablet (12.5 mg total) by mouth daily. 90 tablet 1  . IRON PO Take by mouth.    . IRON, FERROUS SULFATE, PO Take 1 tablet by mouth daily.    Marland Kitchen lisinopril (PRINIVIL,ZESTRIL) 40 MG tablet TAKE ONE TABLET BY MOUTH ONCE DAILY 90 tablet 1  . loratadine (CLARITIN) 10 MG tablet Take 10 mg by mouth every evening.     . meloxicam (MOBIC) 15 MG tablet TAKE 1/2 TO 1 (ONE-HALF TO ONE) TABLET BY MOUTH ONCE DAILY WITH FOOD FOR PAIN AND INFLAMMATION 90 tablet 1  . Multiple Vitamin (MULTIVITAMIN) capsule Take 1 capsule by mouth at bedtime.     Marland Kitchen omeprazole (PRILOSEC OTC) 20 MG tablet Take 20 mg by mouth daily.    Marland Kitchen omeprazole (PRILOSEC) 40 MG capsule Take 1 capsule (40 mg total) by mouth daily. 30 capsule 1  . phentermine (ADIPEX-P) 37.5 MG tablet Take 1 tablet (37.5 mg  total) by mouth daily before breakfast. 30 tablet 2  . venlafaxine XR (EFFEXOR-XR) 75 MG 24 hr capsule TAKE 1 CAPSULE BY MOUTH  DAILY 90 capsule 1   No current facility-administered medications on file prior to visit.    Medical History:  Past Medical History:  Diagnosis Date  . Allergy    seasonal  . Arthritis    "knees, back" (02/17/2016)  . GERD (gastroesophageal reflux disease)   . History of hiatal hernia   . Hypertension    HX OF HYPERTENSION PRIOR TO LAP BANDING - WAS TAKEN OFF BP MED  . IBS (irritable bowel syndrome)   . Numbness    LEFT FOOT  . OSA (obstructive sleep apnea)    resolved  with Lap Band/2nd test  . PONV (postoperative nausea and vomiting)    PONV with gallbladder - no PONV since that procedure   Allergies:  Allergies  Allergen Reactions  . Mucinex [Guaifenesin Er] Other (See Comments)    "feels weird and dizzy"  . Tramadol Itching     Review of Systems:  Review of Systems  Constitutional: Negative.  Negative for chills and fever.  HENT: Negative.   Eyes: Negative.   Respiratory: Negative.   Cardiovascular: Negative.   Gastrointestinal: Negative.   Genitourinary: Negative.  Negative for frequency, hematuria and urgency.  Musculoskeletal: Positive for back pain. Negative for falls, joint pain, myalgias and neck pain.  Skin: Negative.   Neurological: Positive for tingling and sensory change. Negative for dizziness, tremors, speech change, focal weakness, seizures and loss of consciousness.  Endo/Heme/Allergies: Negative.   Psychiatric/Behavioral: Negative.     Family history- Review and unchanged Social history- Review and unchanged Physical Exam: BP 130/80   Pulse 82   Temp 97.6 F (36.4 C)   Resp 16   Ht 5\' 1"  (1.549 m)   Wt 233 lb (105.7 kg)   SpO2 97%   BMI 44.02 kg/m  Wt Readings from Last 3 Encounters:  05/24/17 233 lb (105.7 kg)  01/28/17 240 lb 12.8 oz (109.2 kg)  10/07/16 242 lb 3.2 oz (109.9 kg)   General Appearance: Well nourished, in no apparent distress. Eyes: PERRLA, EOMs, conjunctiva no swelling or erythema Sinuses: No Frontal/maxillary tenderness ENT/Mouth: Ext aud canals clear, TMs without erythema, bulging. No erythema, swelling, or exudate on post pharynx.  Tonsils not swollen or erythematous. Hearing normal.  Neck: Supple, thyroid normal.  Respiratory: Respiratory effort normal, BS equal bilaterally without rales, rhonchi, wheezing or stridor.  Cardio: RRR with no MRGs. Brisk peripheral pulses without edema.  Abdomen: Soft, + BS,  Non tender, no guarding, rebound, hernias, masses. Lymphatics: Non tender without  lymphadenopathy.  Musculoskeletal: Full ROM, 5/5 strength, Patient is able to ambulate well. Gait is  Antalgic. Straight leg raising with dorsiflexion negative bilaterally for radicular symptoms. Sensory exam in the legs are normal. Knee reflexes are normal Ankle reflexes are normal Strength is normal and symmetric in arms and legs. There is not SI tenderness to palpation.  There is notparaspinal muscle spasm.  There is not midline tenderness.  ROM of spine with  limited in all spheres due to pain.  Skin: Warm, dry without rashes, lesions, ecchymosis.  Neuro: Cranial nerves intact. Normal muscle tone, no cerebellar symptoms. Psych: Awake and oriented X 3, normal affect, Insight and Judgment appropriate.    [name], PA-C 3:57 PM [facility]

## 2017-05-22 ENCOUNTER — Other Ambulatory Visit: Payer: Self-pay | Admitting: Internal Medicine

## 2017-05-24 ENCOUNTER — Encounter: Payer: Self-pay | Admitting: Physician Assistant

## 2017-05-24 ENCOUNTER — Ambulatory Visit (INDEPENDENT_AMBULATORY_CARE_PROVIDER_SITE_OTHER): Payer: 59 | Admitting: Physician Assistant

## 2017-05-24 ENCOUNTER — Other Ambulatory Visit: Payer: Self-pay | Admitting: Orthopedic Surgery

## 2017-05-24 VITALS — BP 130/80 | HR 82 | Temp 97.6°F | Resp 16 | Ht 61.0 in | Wt 233.0 lb

## 2017-05-24 DIAGNOSIS — R7303 Prediabetes: Secondary | ICD-10-CM | POA: Diagnosis not present

## 2017-05-24 DIAGNOSIS — E785 Hyperlipidemia, unspecified: Secondary | ICD-10-CM

## 2017-05-24 DIAGNOSIS — Z79899 Other long term (current) drug therapy: Secondary | ICD-10-CM

## 2017-05-24 DIAGNOSIS — M545 Low back pain, unspecified: Secondary | ICD-10-CM

## 2017-05-24 DIAGNOSIS — M1712 Unilateral primary osteoarthritis, left knee: Secondary | ICD-10-CM

## 2017-05-24 DIAGNOSIS — G8929 Other chronic pain: Secondary | ICD-10-CM | POA: Diagnosis not present

## 2017-05-24 DIAGNOSIS — I10 Essential (primary) hypertension: Secondary | ICD-10-CM

## 2017-05-24 MED ORDER — GABAPENTIN 300 MG PO CAPS: 300.0000 mg | ORAL_CAPSULE | Freq: Three times a day (TID) | ORAL | 2 refills | Status: DC

## 2017-05-24 MED ORDER — DICLOFENAC SODIUM 75 MG PO TBEC: 75.0000 mg | DELAYED_RELEASE_TABLET | Freq: Two times a day (BID) | ORAL | 0 refills | Status: DC

## 2017-05-24 NOTE — Patient Instructions (Signed)
Meralagia paraesthetica or lateral femoral cutaneous nerve  Can switch from mobic to diclofenac 75mg  Can do gabapentin 300mg  1-2 at night for back  Can take the gabapentin for nerve pain. It can make you sleepy so we suggest trying it at night first and please plan to not drive or do anything strenuous. Also please do not take this medication with alcohol.  Start out 1 pill at night before bed, can increase to 2 pills at night before bed. Please call the office if you have any side effects.  Can take 1-3 hours to kicks in  Can take 3 pills a day however you would like  Some examples: - 1 breakfast, lunch, bedtime. - 1 at breakfast, 2 at bed time   How should I use this medicine? Take this medicine by mouth with a glass of water. Follow the directions on the prescription label. You can take this medicine with or without food. Take your doses at regular intervals. Do not take your medicine more often than directed. Do not stop taking except on your doctor's advice.  What if I miss a dose? If you miss a dose, take it as soon as you can. If it is almost time for your next dose, take only that dose. Do not take double or extra doses.  What should I watch for while using this medicine? Tell your doctor or healthcare professional if your symptoms do not start to get better or if they get worse.   You may get drowsy or dizzy. Do not drive, use machinery, or do anything that needs mental alertness until you know how this medicine affects you. Do not stand or sit up quickly, especially if you are an older patient. This reduces the risk of dizzy or fainting spells. Alcohol may interfere with the effect of this medicine. Avoid alcoholic drinks. If you have a heart condition, like congestive heart failure, and notice that you are retaining water and have swelling in your hands or feet, contact your health care provider immediately.  What side effects may I notice from receiving this medicine? Side  effects that you should report to your doctor or health care professional as soon as possible and are very rare: -allergic reactions like skin rash, itching or hives, swelling of the face, lips, or tongue -breathing problems -changes in vision -jerking or unusual movements of any part of your body -suicidal thoughts or other mood changes -swelling of the ankles, feet, hands -unusual bruising or bleeding  Side effects that usually do not require medical attention (Report these to your doctor or health care professional if they continue or are bothersome.): -dizziness -drowsiness -dry mouth -nausea -tremors

## 2017-05-26 ENCOUNTER — Other Ambulatory Visit: Payer: Self-pay | Admitting: Physician Assistant

## 2017-05-26 DIAGNOSIS — I10 Essential (primary) hypertension: Secondary | ICD-10-CM | POA: Diagnosis not present

## 2017-05-26 DIAGNOSIS — E785 Hyperlipidemia, unspecified: Secondary | ICD-10-CM | POA: Diagnosis not present

## 2017-05-26 DIAGNOSIS — Z79899 Other long term (current) drug therapy: Secondary | ICD-10-CM | POA: Diagnosis not present

## 2017-05-27 DIAGNOSIS — Z9884 Bariatric surgery status: Secondary | ICD-10-CM | POA: Diagnosis not present

## 2017-05-27 LAB — BASIC METABOLIC PANEL
BUN/Creatinine Ratio: 33 — ABNORMAL HIGH (ref 12–28)
BUN: 27 mg/dL (ref 8–27)
CALCIUM: 9.7 mg/dL (ref 8.7–10.3)
CO2: 25 mmol/L (ref 20–29)
CREATININE: 0.83 mg/dL (ref 0.57–1.00)
Chloride: 99 mmol/L (ref 96–106)
GFR calc Af Amer: 87 mL/min/{1.73_m2} (ref >59)
GFR, EST NON AFRICAN AMERICAN: 76 mL/min/{1.73_m2} (ref >59)
GLUCOSE: 89 mg/dL (ref 65–99)
Potassium: 4.2 mmol/L (ref 3.5–5.2)
Sodium: 138 mmol/L (ref 134–144)

## 2017-05-27 LAB — CBC WITH DIFFERENTIAL/PLATELET
Basophils Absolute: 0 10*3/uL (ref 0.0–0.2)
Basos: 0 %
EOS (ABSOLUTE): 0.2 10*3/uL (ref 0.0–0.4)
Eos: 3 %
HEMOGLOBIN: 13.5 g/dL (ref 11.1–15.9)
Hematocrit: 39 % (ref 34.0–46.6)
IMMATURE GRANS (ABS): 0 10*3/uL (ref 0.0–0.1)
Immature Granulocytes: 0 %
LYMPHS ABS: 2 10*3/uL (ref 0.7–3.1)
LYMPHS: 37 %
MCH: 32.6 pg (ref 26.6–33.0)
MCHC: 34.6 g/dL (ref 31.5–35.7)
MCV: 94 fL (ref 79–97)
MONOCYTES: 7 %
Monocytes Absolute: 0.4 10*3/uL (ref 0.1–0.9)
Neutrophils Absolute: 2.9 10*3/uL (ref 1.4–7.0)
Neutrophils: 53 %
Platelets: 245 10*3/uL (ref 150–379)
RBC: 4.14 x10E6/uL (ref 3.77–5.28)
RDW: 13.2 % (ref 12.3–15.4)
WBC: 5.5 10*3/uL (ref 3.4–10.8)

## 2017-05-27 LAB — TSH: TSH: 3.46 u[IU]/mL (ref 0.450–4.500)

## 2017-05-27 LAB — MAGNESIUM: MAGNESIUM: 1.8 mg/dL (ref 1.6–2.3)

## 2017-05-27 LAB — LIPID PANEL W/O CHOL/HDL RATIO
CHOLESTEROL TOTAL: 192 mg/dL (ref 100–199)
HDL: 62 mg/dL (ref >39)
LDL CALC: 92 mg/dL (ref 0–99)
TRIGLYCERIDES: 192 mg/dL — AB (ref 0–149)
VLDL CHOLESTEROL CAL: 38 mg/dL (ref 5–40)

## 2017-05-27 LAB — HEPATIC FUNCTION PANEL
ALT: 34 IU/L — AB (ref 0–32)
AST: 24 IU/L (ref 0–40)
Albumin: 4.3 g/dL (ref 3.6–4.8)
Alkaline Phosphatase: 101 IU/L (ref 39–117)
BILIRUBIN, DIRECT: 0.08 mg/dL (ref 0.00–0.40)
Bilirubin Total: 0.2 mg/dL (ref 0.0–1.2)
Total Protein: 6.7 g/dL (ref 6.0–8.5)

## 2017-05-31 HISTORY — PX: CATARACT EXTRACTION W/ INTRAOCULAR LENS  IMPLANT, BILATERAL: SHX1307

## 2017-05-31 NOTE — Progress Notes (Signed)
LVM for pt to return office call for LAB results.

## 2017-05-31 NOTE — Progress Notes (Signed)
Pt aware of lab results & voiced understanding of those results.

## 2017-06-02 ENCOUNTER — Ambulatory Visit
Admission: RE | Admit: 2017-06-02 | Discharge: 2017-06-02 | Disposition: A | Discharge status: Home / Self Care | Payer: 59 | Source: Ambulatory Visit | Attending: Orthopedic Surgery | Admitting: Orthopedic Surgery

## 2017-06-02 DIAGNOSIS — M1712 Unilateral primary osteoarthritis, left knee: Secondary | ICD-10-CM | POA: Diagnosis present

## 2017-06-02 DIAGNOSIS — M179 Osteoarthritis of knee, unspecified: Secondary | ICD-10-CM | POA: Diagnosis not present

## 2017-06-02 DIAGNOSIS — M7122 Synovial cyst of popliteal space [Baker], left knee: Secondary | ICD-10-CM | POA: Diagnosis not present

## 2017-06-02 DIAGNOSIS — Z01818 Encounter for other preprocedural examination: Secondary | ICD-10-CM | POA: Diagnosis not present

## 2017-06-06 DIAGNOSIS — Z23 Encounter for immunization: Secondary | ICD-10-CM | POA: Diagnosis not present

## 2017-06-07 ENCOUNTER — Other Ambulatory Visit: Payer: Self-pay | Admitting: Physician Assistant

## 2017-06-09 DIAGNOSIS — H2512 Age-related nuclear cataract, left eye: Secondary | ICD-10-CM | POA: Diagnosis not present

## 2017-06-09 DIAGNOSIS — H52222 Regular astigmatism, left eye: Secondary | ICD-10-CM | POA: Diagnosis not present

## 2017-06-09 DIAGNOSIS — H2510 Age-related nuclear cataract, unspecified eye: Secondary | ICD-10-CM | POA: Diagnosis not present

## 2017-06-11 DIAGNOSIS — M1712 Unilateral primary osteoarthritis, left knee: Secondary | ICD-10-CM | POA: Diagnosis not present

## 2017-06-17 DIAGNOSIS — N390 Urinary tract infection, site not specified: Secondary | ICD-10-CM | POA: Diagnosis not present

## 2017-06-23 DIAGNOSIS — H2511 Age-related nuclear cataract, right eye: Secondary | ICD-10-CM | POA: Diagnosis not present

## 2017-06-23 DIAGNOSIS — H2510 Age-related nuclear cataract, unspecified eye: Secondary | ICD-10-CM | POA: Diagnosis not present

## 2017-06-23 DIAGNOSIS — H52221 Regular astigmatism, right eye: Secondary | ICD-10-CM | POA: Diagnosis not present

## 2017-06-26 ENCOUNTER — Other Ambulatory Visit: Payer: Self-pay | Admitting: Internal Medicine

## 2017-06-30 ENCOUNTER — Encounter
Admission: RE | Admit: 2017-06-30 | Discharge: 2017-06-30 | Disposition: A | Discharge status: Home / Self Care | Payer: 59 | Source: Ambulatory Visit | Attending: Orthopedic Surgery | Admitting: Orthopedic Surgery

## 2017-06-30 DIAGNOSIS — Z01812 Encounter for preprocedural laboratory examination: Secondary | ICD-10-CM | POA: Diagnosis not present

## 2017-06-30 DIAGNOSIS — Z0183 Encounter for blood typing: Secondary | ICD-10-CM | POA: Diagnosis not present

## 2017-06-30 LAB — BASIC METABOLIC PANEL
Anion gap: 9 (ref 5–15)
BUN: 22 mg/dL — AB (ref 6–20)
CALCIUM: 9.7 mg/dL (ref 8.9–10.3)
CO2: 28 mmol/L (ref 22–32)
CREATININE: 1.1 mg/dL — AB (ref 0.44–1.00)
Chloride: 103 mmol/L (ref 101–111)
GFR calc non Af Amer: 53 mL/min — ABNORMAL LOW (ref >60)
Glucose, Bld: 146 mg/dL — ABNORMAL HIGH (ref 65–99)
Potassium: 3.9 mmol/L (ref 3.5–5.1)
SODIUM: 140 mmol/L (ref 135–145)

## 2017-06-30 LAB — CBC
HCT: 41.1 % (ref 35.0–47.0)
Hemoglobin: 13.8 g/dL (ref 12.0–16.0)
MCH: 32.9 pg (ref 26.0–34.0)
MCHC: 33.6 g/dL (ref 32.0–36.0)
MCV: 97.8 fL (ref 80.0–100.0)
PLATELETS: 249 10*3/uL (ref 150–440)
RBC: 4.2 MIL/uL (ref 3.80–5.20)
RDW: 13.7 % (ref 11.5–14.5)
WBC: 5.8 10*3/uL (ref 3.6–11.0)

## 2017-06-30 LAB — URINALYSIS, ROUTINE W REFLEX MICROSCOPIC
Bilirubin Urine: NEGATIVE
GLUCOSE, UA: NEGATIVE mg/dL
HGB URINE DIPSTICK: NEGATIVE
KETONES UR: NEGATIVE mg/dL
LEUKOCYTES UA: NEGATIVE
Nitrite: NEGATIVE
PH: 5 (ref 5.0–8.0)
Protein, ur: NEGATIVE mg/dL
Specific Gravity, Urine: 1.019 (ref 1.005–1.030)

## 2017-06-30 LAB — PROTIME-INR
INR: 0.97
PROTHROMBIN TIME: 12.8 s (ref 11.4–15.2)

## 2017-06-30 LAB — SURGICAL PCR SCREEN
MRSA, PCR: NEGATIVE
STAPHYLOCOCCUS AUREUS: NEGATIVE

## 2017-06-30 LAB — APTT: APTT: 28 s (ref 24–36)

## 2017-06-30 LAB — SEDIMENTATION RATE: SED RATE: 10 mm/h (ref 0–30)

## 2017-06-30 NOTE — Patient Instructions (Signed)
Your procedure is scheduled on: Tuesday, July 13, 2017 Report to Same Day Surgery on the 2nd floor in the [facility]. To find out your arrival time, please call [phone] between 1PM - 3PM on: Monday, July 12, 2017  REMEMBER: Instructions that are not followed completely may result in serious medical risk up to and including death; or upon the discretion of your surgeon and anesthesiologist your surgery may need to be rescheduled.  Do not eat food or drink liquids after midnight. No gum chewing or hard candies.  You may however, drink CLEAR liquids up to 2 hours before you are scheduled to arrive at the hospital for your procedure.  Do not drink clear liquids within 2 hours of your scheduled arrival to the hospital as this may lead to your procedure being delayed or rescheduled.  Clear liquids include: - water  - apple juice without pulp - clear gatorade - black coffee or tea (NO milk, creamers, sugars) DO NOT drink anything not on this list.  No Alcohol for 24 hours before or after surgery.  No Smoking for 24 hours prior to surgery.  Notify your doctor if there is any change in your medical condition (cold, fever, infection).  Do not wear jewelry, make-up, hairpins, clips or nail polish.  Do not wear lotions, powders, or perfumes.   Do not shave 48 hours prior to surgery.   Contacts and dentures may not be worn into surgery.  Do not bring valuables to the hospital. [facility] is not responsible for any belongings or valuables.   TAKE THESE MEDICATIONS THE MORNING OF SURGERY WITH A SIP OF WATER:  1.  All eye drops as scheduled 2.  OMEPRAZOLE (helps to prevent nausea after surgery)  Use CHG Soap as directed on instruction sheet.  On November 5 - Stop Anti-inflammatories such as DICLOFENAC, Advil, Aleve, Ibuprofen, Motrin, Naproxen, Naprosyn, Goodie powder, or aspirin products. (May take Tylenol or Acetaminophen if needed.)  On November 5 - Stop over the  counter supplements until after surgery. (May continue multivitamin.)  If you are being admitted to the hospital overnight, leave your suitcase in the car. After surgery it may be brought to your room.  Please call the number above if you have any questions about these instructions.

## 2017-07-01 LAB — TYPE AND SCREEN
ABO/RH(D): O NEG
ANTIBODY SCREEN: NEGATIVE
DAT, IgG: NEGATIVE
WEAK D: POSITIVE

## 2017-07-01 LAB — URINE CULTURE: Culture: NO GROWTH

## 2017-07-03 ENCOUNTER — Other Ambulatory Visit: Payer: Self-pay | Admitting: Internal Medicine

## 2017-07-13 ENCOUNTER — Inpatient Hospital Stay: Payer: 59 | Admitting: Anesthesiology

## 2017-07-13 ENCOUNTER — Encounter
Admission: RE | Disposition: A | Discharge status: Home Health | Payer: Self-pay | Source: Ambulatory Visit | Attending: Orthopedic Surgery

## 2017-07-13 ENCOUNTER — Inpatient Hospital Stay
Admission: RE | Admit: 2017-07-13 | Discharge: 2017-07-16 | DRG: 470 | Disposition: A | Discharge status: Home Health | Payer: 59 | Source: Ambulatory Visit | Attending: Orthopedic Surgery | Admitting: Orthopedic Surgery

## 2017-07-13 ENCOUNTER — Inpatient Hospital Stay: Payer: 59

## 2017-07-13 ENCOUNTER — Other Ambulatory Visit: Payer: Self-pay

## 2017-07-13 ENCOUNTER — Encounter: Payer: Self-pay | Admitting: *Deleted

## 2017-07-13 DIAGNOSIS — Z96652 Presence of left artificial knee joint: Secondary | ICD-10-CM | POA: Diagnosis not present

## 2017-07-13 DIAGNOSIS — M25562 Pain in left knee: Secondary | ICD-10-CM | POA: Diagnosis present

## 2017-07-13 DIAGNOSIS — K219 Gastro-esophageal reflux disease without esophagitis: Secondary | ICD-10-CM | POA: Diagnosis present

## 2017-07-13 DIAGNOSIS — R7303 Prediabetes: Secondary | ICD-10-CM | POA: Diagnosis present

## 2017-07-13 DIAGNOSIS — M1712 Unilateral primary osteoarthritis, left knee: Secondary | ICD-10-CM | POA: Diagnosis not present

## 2017-07-13 DIAGNOSIS — Z87891 Personal history of nicotine dependence: Secondary | ICD-10-CM | POA: Diagnosis not present

## 2017-07-13 DIAGNOSIS — Z9884 Bariatric surgery status: Secondary | ICD-10-CM

## 2017-07-13 DIAGNOSIS — Z471 Aftercare following joint replacement surgery: Secondary | ICD-10-CM | POA: Diagnosis not present

## 2017-07-13 DIAGNOSIS — E785 Hyperlipidemia, unspecified: Secondary | ICD-10-CM | POA: Diagnosis present

## 2017-07-13 DIAGNOSIS — G8918 Other acute postprocedural pain: Secondary | ICD-10-CM

## 2017-07-13 DIAGNOSIS — Z79899 Other long term (current) drug therapy: Secondary | ICD-10-CM | POA: Diagnosis not present

## 2017-07-13 DIAGNOSIS — Z6841 Body Mass Index (BMI) 40.0 and over, adult: Secondary | ICD-10-CM | POA: Diagnosis not present

## 2017-07-13 DIAGNOSIS — G4733 Obstructive sleep apnea (adult) (pediatric): Secondary | ICD-10-CM | POA: Diagnosis present

## 2017-07-13 DIAGNOSIS — I10 Essential (primary) hypertension: Secondary | ICD-10-CM | POA: Diagnosis present

## 2017-07-13 DIAGNOSIS — J45909 Unspecified asthma, uncomplicated: Secondary | ICD-10-CM | POA: Diagnosis present

## 2017-07-13 HISTORY — PX: TOTAL KNEE ARTHROPLASTY: SHX125

## 2017-07-13 LAB — CBC
HCT: 38.6 % (ref 35.0–47.0)
HEMOGLOBIN: 13 g/dL (ref 12.0–16.0)
MCH: 33.1 pg (ref 26.0–34.0)
MCHC: 33.7 g/dL (ref 32.0–36.0)
MCV: 98.1 fL (ref 80.0–100.0)
Platelets: 209 10*3/uL (ref 150–440)
RBC: 3.93 MIL/uL (ref 3.80–5.20)
RDW: 13.4 % (ref 11.5–14.5)
WBC: 7.4 10*3/uL (ref 3.6–11.0)

## 2017-07-13 LAB — ABO/RH
ABO/RH(D): O POS
DAT, IgG: NEGATIVE
Weak D: POSITIVE

## 2017-07-13 LAB — CREATININE, SERUM: CREATININE: 0.61 mg/dL (ref 0.44–1.00)

## 2017-07-13 SURGERY — ARTHROPLASTY, KNEE, TOTAL
Anesthesia: Spinal | Laterality: Left | Wound class: Clean

## 2017-07-13 MED ORDER — HYDROCHLOROTHIAZIDE 25 MG PO TABS: 12.5000 mg | ORAL_TABLET | Freq: Every day | ORAL | Status: DC

## 2017-07-13 MED ORDER — PHENYLEPHRINE HCL 10 MG/ML IJ SOLN
INTRAMUSCULAR | Status: DC | PRN
  Administered 2017-07-13 (×3): 100 ug via INTRAVENOUS

## 2017-07-13 MED ORDER — SODIUM CHLORIDE 0.9 % IJ SOLN
INTRAMUSCULAR | Status: AC
  Filled 2017-07-13: qty 100

## 2017-07-13 MED ORDER — PHENYLEPHRINE HCL 10 MG/ML IJ SOLN
INTRAVENOUS | Status: DC | PRN
  Administered 2017-07-13: 30 ug/min via INTRAVENOUS

## 2017-07-13 MED ORDER — METOCLOPRAMIDE HCL 5 MG/ML IJ SOLN: 5.0000 mg | Freq: Three times a day (TID) | INTRAMUSCULAR | Status: DC | PRN

## 2017-07-13 MED ORDER — MIDAZOLAM HCL 5 MG/5ML IJ SOLN
INTRAMUSCULAR | Status: DC | PRN
  Administered 2017-07-13: 2 mg via INTRAVENOUS

## 2017-07-13 MED ORDER — TRANEXAMIC ACID 1000 MG/10ML IV SOLN
1000.0000 mg | INTRAVENOUS | Status: AC
  Administered 2017-07-13: 1000 mg via INTRAVENOUS
  Filled 2017-07-13: qty 10

## 2017-07-13 MED ORDER — MIDAZOLAM HCL 2 MG/2ML IJ SOLN
INTRAMUSCULAR | Status: AC
  Filled 2017-07-13: qty 2

## 2017-07-13 MED ORDER — HYDROCHLOROTHIAZIDE 25 MG PO TABS
12.5000 mg | ORAL_TABLET | Freq: Every day | ORAL | Status: DC
  Administered 2017-07-14 – 2017-07-16 (×3): 12.5 mg via ORAL
  Filled 2017-07-13 (×3): qty 1

## 2017-07-13 MED ORDER — METHOCARBAMOL 1000 MG/10ML IJ SOLN
500.0000 mg | Freq: Four times a day (QID) | INTRAVENOUS | Status: DC | PRN
  Filled 2017-07-13: qty 5

## 2017-07-13 MED ORDER — LISINOPRIL 20 MG PO TABS
40.0000 mg | ORAL_TABLET | Freq: Every day | ORAL | Status: DC
  Administered 2017-07-14 – 2017-07-16 (×3): 40 mg via ORAL
  Filled 2017-07-13 (×3): qty 2

## 2017-07-13 MED ORDER — BUPIVACAINE HCL (PF) 0.5 % IJ SOLN
INTRAMUSCULAR | Status: DC | PRN
  Administered 2017-07-13: 15 mg

## 2017-07-13 MED ORDER — PANTOPRAZOLE SODIUM 40 MG PO TBEC
80.0000 mg | DELAYED_RELEASE_TABLET | Freq: Every day | ORAL | Status: DC
  Administered 2017-07-14 – 2017-07-16 (×3): 80 mg via ORAL
  Filled 2017-07-13 (×3): qty 2

## 2017-07-13 MED ORDER — ADULT MULTIVITAMIN W/MINERALS CH
1.0000 | ORAL_TABLET | Freq: Every day | ORAL | Status: DC
  Administered 2017-07-14 – 2017-07-16 (×3): 1 via ORAL
  Filled 2017-07-13 (×3): qty 1

## 2017-07-13 MED ORDER — LORATADINE 10 MG PO TABS
10.0000 mg | ORAL_TABLET | Freq: Every day | ORAL | Status: DC
  Administered 2017-07-14 – 2017-07-16 (×3): 10 mg via ORAL
  Filled 2017-07-13 (×3): qty 1

## 2017-07-13 MED ORDER — BUPIVACAINE LIPOSOME 1.3 % IJ SUSP
INTRAMUSCULAR | Status: AC
  Filled 2017-07-13: qty 20

## 2017-07-13 MED ORDER — VENLAFAXINE HCL ER 75 MG PO CP24
75.0000 mg | ORAL_CAPSULE | Freq: Every day | ORAL | Status: DC
  Administered 2017-07-13 – 2017-07-15 (×3): 75 mg via ORAL
  Filled 2017-07-13 (×5): qty 1

## 2017-07-13 MED ORDER — CEFAZOLIN SODIUM-DEXTROSE 2-4 GM/100ML-% IV SOLN
INTRAVENOUS | Status: AC
  Filled 2017-07-13: qty 100

## 2017-07-13 MED ORDER — ONDANSETRON HCL 4 MG/2ML IJ SOLN
INTRAMUSCULAR | Status: DC | PRN
Start: 2017-07-13 — End: 2017-07-13
  Administered 2017-07-13: 4 mg via INTRAVENOUS

## 2017-07-13 MED ORDER — PROPOFOL 10 MG/ML IV BOLUS
INTRAVENOUS | Status: AC
  Filled 2017-07-13: qty 40

## 2017-07-13 MED ORDER — MAGNESIUM HYDROXIDE 400 MG/5ML PO SUSP
30.0000 mL | Freq: Every day | ORAL | Status: DC | PRN
  Administered 2017-07-14 – 2017-07-15 (×2): 30 mL via ORAL
  Filled 2017-07-13: qty 30

## 2017-07-13 MED ORDER — LACTATED RINGERS IV SOLN
INTRAVENOUS | Status: DC
  Administered 2017-07-13 (×3): via INTRAVENOUS

## 2017-07-13 MED ORDER — SODIUM CHLORIDE 0.9 % IV SOLN
INTRAVENOUS | Status: DC | PRN
  Administered 2017-07-13: 60 mL

## 2017-07-13 MED ORDER — METOCLOPRAMIDE HCL 10 MG PO TABS: 5.0000 mg | ORAL_TABLET | Freq: Three times a day (TID) | ORAL | Status: DC | PRN

## 2017-07-13 MED ORDER — FENTANYL CITRATE (PF) 100 MCG/2ML IJ SOLN
INTRAMUSCULAR | Status: AC
  Filled 2017-07-13: qty 2

## 2017-07-13 MED ORDER — CEFAZOLIN SODIUM-DEXTROSE 2-4 GM/100ML-% IV SOLN: 2.0000 g | Freq: Once | INTRAVENOUS | Status: DC

## 2017-07-13 MED ORDER — MORPHINE SULFATE (PF) 2 MG/ML IV SOLN
2.0000 mg | INTRAVENOUS | Status: DC | PRN
  Administered 2017-07-13 – 2017-07-14 (×2): 2 mg via INTRAVENOUS
  Filled 2017-07-13 (×3): qty 1

## 2017-07-13 MED ORDER — CEFAZOLIN SODIUM 10 G IJ SOLR
2.0000 g | Freq: Four times a day (QID) | INTRAMUSCULAR | Status: AC
  Administered 2017-07-13 – 2017-07-14 (×3): 2 g via INTRAVENOUS
  Filled 2017-07-13 (×3): qty 2000

## 2017-07-13 MED ORDER — KETOROLAC TROMETHAMINE 0.5 % OP SOLN
1.0000 [drp] | Freq: Three times a day (TID) | OPHTHALMIC | Status: DC
  Administered 2017-07-13 – 2017-07-16 (×9): 1 [drp] via OPHTHALMIC
  Filled 2017-07-13: qty 3

## 2017-07-13 MED ORDER — ACETAMINOPHEN 325 MG PO TABS
650.0000 mg | ORAL_TABLET | ORAL | Status: DC | PRN
  Administered 2017-07-13 (×2): 650 mg via ORAL
  Filled 2017-07-13 (×2): qty 2

## 2017-07-13 MED ORDER — ONDANSETRON HCL 4 MG/2ML IJ SOLN: 4.0000 mg | Freq: Four times a day (QID) | INTRAMUSCULAR | Status: DC | PRN

## 2017-07-13 MED ORDER — PROPOFOL 500 MG/50ML IV EMUL
INTRAVENOUS | Status: AC
  Filled 2017-07-13: qty 50

## 2017-07-13 MED ORDER — DOCUSATE SODIUM 100 MG PO CAPS
100.0000 mg | ORAL_CAPSULE | Freq: Two times a day (BID) | ORAL | Status: DC
  Administered 2017-07-13 – 2017-07-16 (×6): 100 mg via ORAL
  Filled 2017-07-13 (×7): qty 1

## 2017-07-13 MED ORDER — METHOCARBAMOL 500 MG PO TABS
500.0000 mg | ORAL_TABLET | Freq: Four times a day (QID) | ORAL | Status: DC | PRN
  Administered 2017-07-13 – 2017-07-15 (×4): 500 mg via ORAL
  Filled 2017-07-13 (×4): qty 1

## 2017-07-13 MED ORDER — FENTANYL CITRATE (PF) 100 MCG/2ML IJ SOLN: 25.0000 ug | INTRAMUSCULAR | Status: DC | PRN

## 2017-07-13 MED ORDER — ZOLPIDEM TARTRATE 5 MG PO TABS
5.0000 mg | ORAL_TABLET | Freq: Every evening | ORAL | Status: DC | PRN
  Administered 2017-07-13 – 2017-07-14 (×2): 5 mg via ORAL
  Filled 2017-07-13 (×2): qty 1

## 2017-07-13 MED ORDER — BISACODYL 10 MG RE SUPP
10.0000 mg | Freq: Every day | RECTAL | Status: DC | PRN
  Administered 2017-07-16: 10 mg via RECTAL
  Filled 2017-07-13: qty 1

## 2017-07-13 MED ORDER — DIPHENHYDRAMINE HCL 12.5 MG/5ML PO ELIX: 12.5000 mg | ORAL_SOLUTION | ORAL | Status: DC | PRN

## 2017-07-13 MED ORDER — BUPIVACAINE-EPINEPHRINE (PF) 0.25% -1:200000 IJ SOLN
INTRAMUSCULAR | Status: AC
  Filled 2017-07-13: qty 30

## 2017-07-13 MED ORDER — LIDOCAINE HCL (PF) 2 % IJ SOLN
INTRAMUSCULAR | Status: AC
  Filled 2017-07-13: qty 10

## 2017-07-13 MED ORDER — MAGNESIUM CITRATE PO SOLN
1.0000 | Freq: Once | ORAL | Status: DC | PRN
  Filled 2017-07-13: qty 296

## 2017-07-13 MED ORDER — OXYCODONE HCL 5 MG PO TABS
5.0000 mg | ORAL_TABLET | ORAL | Status: DC | PRN
  Administered 2017-07-13: 5 mg via ORAL
  Filled 2017-07-13: qty 1

## 2017-07-13 MED ORDER — ONDANSETRON HCL 4 MG/2ML IJ SOLN
INTRAMUSCULAR | Status: AC
  Filled 2017-07-13: qty 2

## 2017-07-13 MED ORDER — ONDANSETRON HCL 4 MG PO TABS
4.0000 mg | ORAL_TABLET | Freq: Four times a day (QID) | ORAL | Status: DC | PRN
  Administered 2017-07-14: 4 mg via ORAL
  Filled 2017-07-13: qty 1

## 2017-07-13 MED ORDER — MORPHINE SULFATE (PF) 10 MG/ML IV SOLN
INTRAVENOUS | Status: AC
  Filled 2017-07-13: qty 1

## 2017-07-13 MED ORDER — ACETAMINOPHEN 650 MG RE SUPP: 650.0000 mg | RECTAL | Status: DC | PRN

## 2017-07-13 MED ORDER — NEOMYCIN-POLYMYXIN B GU 40-200000 IR SOLN
Status: AC
  Filled 2017-07-13: qty 12

## 2017-07-13 MED ORDER — ENOXAPARIN SODIUM 30 MG/0.3ML ~~LOC~~ SOLN
30.0000 mg | Freq: Two times a day (BID) | SUBCUTANEOUS | Status: DC
  Administered 2017-07-14: 30 mg via SUBCUTANEOUS
  Filled 2017-07-13: qty 0.3

## 2017-07-13 MED ORDER — OXYCODONE HCL 5 MG PO TABS
10.0000 mg | ORAL_TABLET | ORAL | Status: DC | PRN
  Administered 2017-07-13 – 2017-07-14 (×6): 10 mg via ORAL
  Filled 2017-07-13 (×6): qty 2

## 2017-07-13 MED ORDER — MENTHOL 3 MG MT LOZG
1.0000 | LOZENGE | OROMUCOSAL | Status: DC | PRN
  Filled 2017-07-13: qty 9

## 2017-07-13 MED ORDER — FENTANYL CITRATE (PF) 100 MCG/2ML IJ SOLN
INTRAMUSCULAR | Status: DC | PRN
  Administered 2017-07-13 (×2): 50 ug via INTRAVENOUS

## 2017-07-13 MED ORDER — BUPIVACAINE-EPINEPHRINE (PF) 0.25% -1:200000 IJ SOLN
INTRAMUSCULAR | Status: DC | PRN
  Administered 2017-07-13: 30 mL via PERINEURAL

## 2017-07-13 MED ORDER — CEFAZOLIN SODIUM-DEXTROSE 2-3 GM-%(50ML) IV SOLR
INTRAVENOUS | Status: DC | PRN
  Administered 2017-07-13: 2 g via INTRAVENOUS

## 2017-07-13 MED ORDER — TRANEXAMIC ACID 1000 MG/10ML IV SOLN
INTRAVENOUS | Status: AC
  Filled 2017-07-13: qty 10

## 2017-07-13 MED ORDER — DEXAMETHASONE SODIUM PHOSPHATE 10 MG/ML IJ SOLN
INTRAMUSCULAR | Status: DC | PRN
  Administered 2017-07-13: 10 mg via INTRAVENOUS

## 2017-07-13 MED ORDER — SODIUM CHLORIDE 0.9 % IV SOLN
INTRAVENOUS | Status: DC
  Administered 2017-07-13 – 2017-07-14 (×2): via INTRAVENOUS

## 2017-07-13 MED ORDER — PREDNISOLONE ACETATE 1 % OP SUSP
1.0000 [drp] | Freq: Three times a day (TID) | OPHTHALMIC | Status: DC
  Administered 2017-07-13 – 2017-07-16 (×9): 1 [drp] via OPHTHALMIC
  Filled 2017-07-13: qty 1

## 2017-07-13 MED ORDER — NEOMYCIN-POLYMYXIN B GU 40-200000 IR SOLN
Status: DC | PRN
  Administered 2017-07-13: 12 mL

## 2017-07-13 MED ORDER — ONDANSETRON HCL 4 MG/2ML IJ SOLN: 4.0000 mg | Freq: Once | INTRAMUSCULAR | Status: DC | PRN

## 2017-07-13 MED ORDER — PHENOL 1.4 % MT LIQD
1.0000 | OROMUCOSAL | Status: DC | PRN
  Filled 2017-07-13: qty 177

## 2017-07-13 MED ORDER — MORPHINE SULFATE 10 MG/ML IJ SOLN
INTRAMUSCULAR | Status: DC | PRN
  Administered 2017-07-13: 10 mg via INTRAVENOUS

## 2017-07-13 MED ORDER — PROPOFOL 500 MG/50ML IV EMUL
INTRAVENOUS | Status: DC | PRN
  Administered 2017-07-13: 50 ug/kg/min via INTRAVENOUS

## 2017-07-13 SURGICAL SUPPLY — 63 items
BANDAGE ACE 6X5 VEL STRL LF (GAUZE/BANDAGES/DRESSINGS) ×3 IMPLANT
BLADE SAW 1 (BLADE) ×3 IMPLANT
BLOCK CUTTING FEMUR 3 LT MED (MISCELLANEOUS) IMPLANT
BLOCK CUTTING TIBIAL 2 LT (MISCELLANEOUS) ×2 IMPLANT
CANISTER SUCT 1200ML W/VALVE (MISCELLANEOUS) ×3 IMPLANT
CANISTER SUCT 3000ML PPV (MISCELLANEOUS) ×6 IMPLANT
CAPT KNEE TOTAL 3 ×2 IMPLANT
CATH FOL LEG HOLDER (MISCELLANEOUS) ×3 IMPLANT
CATH TRAY METER 16FR LF (MISCELLANEOUS) ×3 IMPLANT
CEMENT HV SMART SET (Cement) ×6 IMPLANT
CHLORAPREP W/TINT 26ML (MISCELLANEOUS) ×6 IMPLANT
COOLER POLAR GLACIER W/PUMP (MISCELLANEOUS) ×3 IMPLANT
CUFF TOURN 24 STER (MISCELLANEOUS) IMPLANT
CUFF TOURN 30 STER DUAL PORT (MISCELLANEOUS) IMPLANT
DRAPE SHEET LG 3/4 BI-LAMINATE (DRAPES) ×6 IMPLANT
ELECT CAUTERY BLADE 6.4 (BLADE) ×3 IMPLANT
ELECT REM PT RETURN 9FT ADLT (ELECTROSURGICAL) ×3
ELECTRODE REM PT RTRN 9FT ADLT (ELECTROSURGICAL) ×1 IMPLANT
FEMUR BONE MODEL ×2 IMPLANT
GAUZE PETRO XEROFOAM 1X8 (MISCELLANEOUS) ×3 IMPLANT
GAUZE SPONGE 4X4 12PLY STRL (GAUZE/BANDAGES/DRESSINGS) ×3 IMPLANT
GLOVE BIOGEL PI IND STRL 9 (GLOVE) ×1 IMPLANT
GLOVE BIOGEL PI INDICATOR 9 (GLOVE) ×2
GLOVE INDICATOR 8.0 STRL GRN (GLOVE) ×3 IMPLANT
GLOVE SURG ORTHO 8.0 STRL STRW (GLOVE) ×3 IMPLANT
GLOVE SURG SYN 9.0  PF PI (GLOVE) ×2
GLOVE SURG SYN 9.0 PF PI (GLOVE) ×1 IMPLANT
GOWN SRG 2XL LVL 4 RGLN SLV (GOWNS) ×1 IMPLANT
GOWN STRL NON-REIN 2XL LVL4 (GOWNS) ×3
GOWN STRL REUS W/ TWL LRG LVL3 (GOWN DISPOSABLE) ×1 IMPLANT
GOWN STRL REUS W/ TWL XL LVL3 (GOWN DISPOSABLE) ×1 IMPLANT
GOWN STRL REUS W/TWL LRG LVL3 (GOWN DISPOSABLE) ×3
GOWN STRL REUS W/TWL XL LVL3 (GOWN DISPOSABLE) ×3
HOOD PEEL AWAY FLYTE STAYCOOL (MISCELLANEOUS) ×6 IMPLANT
IMMBOLIZER KNEE 19 BLUE UNIV (SOFTGOODS) ×3 IMPLANT
KIT RM TURNOVER STRD PROC AR (KITS) ×3 IMPLANT
KNEE MEDACTA TIBIAL/FEMORAL BL (Knees) ×2 IMPLANT
KNIFE SCULPS 14X20 (INSTRUMENTS) ×3 IMPLANT
NDL SAFETY 18GX1.5 (NEEDLE) ×3 IMPLANT
NDL SPNL 18GX3.5 QUINCKE PK (NEEDLE) ×1 IMPLANT
NDL SPNL 20GX3.5 QUINCKE YW (NEEDLE) ×1 IMPLANT
NEEDLE SPNL 18GX3.5 QUINCKE PK (NEEDLE) ×3 IMPLANT
NEEDLE SPNL 20GX3.5 QUINCKE YW (NEEDLE) ×3 IMPLANT
NS IRRIG 1000ML POUR BTL (IV SOLUTION) ×3 IMPLANT
PACK TOTAL KNEE (MISCELLANEOUS) ×3 IMPLANT
PAD WRAPON POLAR KNEE (MISCELLANEOUS) ×1 IMPLANT
PULSAVAC PLUS IRRIG FAN TIP (DISPOSABLE) ×3
SOL .9 NS 3000ML IRR  AL (IV SOLUTION) ×2
SOL .9 NS 3000ML IRR AL (IV SOLUTION) ×1
SOL .9 NS 3000ML IRR UROMATIC (IV SOLUTION) ×1 IMPLANT
STAPLER SKIN PROX 35W (STAPLE) ×3 IMPLANT
SUCTION FRAZIER HANDLE 10FR (MISCELLANEOUS) ×2
SUCTION TUBE FRAZIER 10FR DISP (MISCELLANEOUS) ×1 IMPLANT
SUT DVC 2 QUILL PDO  T11 36X36 (SUTURE) ×2
SUT DVC 2 QUILL PDO T11 36X36 (SUTURE) ×1 IMPLANT
SUT V-LOC 90 ABS DVC 3-0 CL (SUTURE) ×3 IMPLANT
SYR 20CC LL (SYRINGE) ×3 IMPLANT
SYR 50ML LL SCALE MARK (SYRINGE) ×6 IMPLANT
TIBIAL BONE MODEL LEFT (MISCELLANEOUS) IMPLANT
TIP FAN IRRIG PULSAVAC PLUS (DISPOSABLE) ×1 IMPLANT
TOWEL OR 17X26 4PK STRL BLUE (TOWEL DISPOSABLE) ×3 IMPLANT
TOWER CARTRIDGE SMART MIX (DISPOSABLE) ×3 IMPLANT
WRAPON POLAR PAD KNEE (MISCELLANEOUS) ×3

## 2017-07-13 NOTE — Progress Notes (Signed)
PT Cancellation Note  Patient Details Name: Maureen [name] MRN: [id number] DOB: 1955/09/17   Cancelled Treatment:    Reason Eval/Treat Not Completed: Medical issues which prohibited therapy; Pt assessed and found to be lacking sensation to light touch to operative LLE.  Will attempt to see pt at a future date/time as medically appropriate.     Linus Salmons PT, DPT 07/13/17, 2:25 PM

## 2017-07-13 NOTE — Op Note (Signed)
07/13/2017  12:10 PM  PATIENT:  Maureen [name]  61 y.o. female  PRE-OPERATIVE DIAGNOSIS:  PRIMARY LOCALIZED OSTEOARTHRITIS OF LEFT KNEE  POST-OPERATIVE DIAGNOSIS:  PRIMARY LOCALIZED OSTEOARTHRITIS OF LEFT KNEE  PROCEDURE:  Procedure(s): TOTAL KNEE ARTHROPLASTY (Left)  SURGEON: Laurene Footman, MD  ASSISTANTS: Rachelle Hora [facility]  ANESTHESIA:   spinal  EBL:  Total I/O In: 1400 [I.V.:1400] Out: 650 [Urine:150; Blood:500]  BLOOD ADMINISTERED:none  DRAINS: none   LOCAL MEDICATIONS USED:  MARCAINE    and OTHER morphine andExparel  SPECIMEN:  No Specimen  DISPOSITION OF SPECIMEN:  PATHOLOGY  COUNTS:  YES  TOURNIQUET:  * Missing tourniquet times found for documented tourniquets in log: [id number] *25 minutes at 300 mmHg  IMPLANTS: Medacta GMK sphere 3 femur, 2 tibia was short stem and 17 mm insert and 2 patella, all components cemented  DICTATION: .Dragon Dictation   patient brought the operating room and after adequate spinal anesthesia was obtained leftleg was prepped and draped in sterile fashion. After patient identification and timeout procedures were completed midline skin incision was made followed by medial parapatellar arthrotomy. The knee revealede wear of the medial compartment both femoral and tibial condyles with exposed bone and milder degenerative change laterally with mild synovitis as well as exposed bone in the femoral trochlea and patella centrally, milder degenerative changes to the medial compartment. . The fat pad and anterior cruciate ligament and PCL were excised and the proximal tibia cutting guide was applied to the proximal tibia and axial tibia cut carried out followed by distal femur cut. The 4-in-1 cutting guide was applied to the femur anterior posterior and chamfer cuts made. The posterior horns of the menisci could be resected this time and the size2tibia baseplate trial was placed in apporpriote rotationand proximal reaming carried out for short stem.  The keel punch was placed followed by the 3 femoral trial and a 49mm insert gave good stability through range of motion. This was thenchosen as the final implant. Distal femur drill holes were made followed by the trochlear groove cut and then trials were removed. The patella was affected with arthritis as welland resection was carried out drilling plate carried out and then a size 2patella fit well. These trials were all removed.. Thelocal anesthetic noted above was infiltrated in the para-articular tissue. The tourniquet was then raised and the bony surfaces thoroughly irrigated and dried. Tibial component was cemented in place first followed by the plastic insert and then the femoral component and the knee placed in extension with excess cement being removed. Next the patellar button was clamped into place and after the cement entirely set the clamp was removed the patella tracked well with the tourniquet down the knee was thoroughly irrigated and then closed with a heavy Quill with 3-0 locking suture followed by skin staples. Incisional wound VAC applied along with a Polar Care   PLAN OF CARE: Admit to inpatient   PATIENT DISPOSITION:  PACU - hemodynamically stable.

## 2017-07-13 NOTE — Anesthesia Post-op Follow-up Note (Signed)
Anesthesia QCDR form completed.        

## 2017-07-13 NOTE — H&P (Signed)
Reviewed paper H+P, will be scanned into chart. No changes noted.  

## 2017-07-13 NOTE — Care Management Note (Signed)
Case Management Note  Patient Details  Name: Maureen [name] MRN: [id number] Date of Birth: 12-07-1955  Subjective/Objective:                  RNCM spoke with patient regarding discharge planning. She hopes to return home with her husband at discharge. She is unsure what home health agency she would like to use.  She uses [facility] for medications [phone]. She states she has a front-wheeled walker available at home for use. Per Dr. Rudene Christians he would like for Lovenox/or generic 40mg  injection daily for 14 days- no refills called to patient's pharmacy. PT pending.    Action/Plan:  RNCM will follow.  Lovenox has been called in to patient's pharmacy.   Expected Discharge Date:                  Expected Discharge Plan:     In-House Referral:     Discharge planning Services  CM Consult  Post Acute Care Choice:  Home Health, Durable Medical Equipment Choice offered to:  Patient  DME Arranged:    DME Agency:     HH Arranged:  PT HH Agency:     Status of Service:  In process, will continue to follow  If discussed at Long Length of Stay Meetings, dates discussed:    Additional Comments:  Marshell Garfinkel, RN 07/13/2017, 1:39 PM

## 2017-07-13 NOTE — Evaluation (Signed)
Physical Therapy Evaluation Patient Details Name: Maureen [name] MRN: [id number] DOB: 1956-02-20 Today's Date: 07/13/2017   History of Present Illness  61 y/o female s/p L TKA 07/13/17, had R knee replaced 6/17.  Clinical Impression  Pt with sensation restored to L LE, overall she did well with POD0 PT exam though she did deal with a lot of pain t/o the session.  She was able to do ~12 minutes of exercises apart from PT exam and though she could not do an against-gravity SLR she showed good quad engagement and did well with all other acts.  She was able to walk 30+ ft with walker and though she endorsed considerable pain with the effort she did well and was safe.  Pt should be able to go home with assist from husband and HHPT on discharge.    Follow Up Recommendations Home health PT    Equipment Recommendations       Recommendations for Other Services       Precautions / Restrictions Precautions Precautions: Fall Restrictions Weight Bearing Restrictions: Yes LLE Weight Bearing: Weight bearing as tolerated      Mobility  Bed Mobility Overal bed mobility: Modified Independent             General bed mobility comments: Pt able to get herself up to sitting EOB w/o assist  Transfers Overall transfer level: Modified independent Equipment used: Rolling walker (2 wheeled)             General transfer comment: Pt wanting to pull up from walker, needed cues for appropriate sequencing and strategy, able to rise w/o assist  Ambulation/Gait Ambulation/Gait assistance: Min guard Ambulation Distance (Feet): 35 Feet Assistive device: Rolling walker (2 wheeled)       General Gait Details: Pt with slow/hesitant ambulation, but had no buckling, no LOBs, no safety issues and ultimately did well with POD0 ambulation  Stairs            Wheelchair Mobility    Modified Rankin (Stroke Patients Only)       Balance Overall balance assessment: Modified Independent                                           Pertinent Vitals/Pain Pain Assessment: 0-10 Pain Score: 7 (10/10 with ROM and WBing)    Home Living Family/patient expects to be discharged to:: Private residence Living Arrangements: Spouse/significant other Available Help at Discharge: Family Type of Home: House Home Access: Stairs to enter Entrance Stairs-Rails: Left Entrance Stairs-Number of Steps: 4   Home Equipment: Environmental consultant - 2 wheels      Prior Function Level of Independence: Independent         Comments: Pt able to be relatively active, do what she wanted     Hand Dominance        Extremity/Trunk Assessment   Upper Extremity Assessment Upper Extremity Assessment: Overall WFL for tasks assessed    Lower Extremity Assessment Lower Extremity Assessment: Overall WFL for tasks assessed(expected post-op L LE weakness)       Communication   Communication: No difficulties  Cognition Arousal/Alertness: Awake/alert Behavior During Therapy: WFL for tasks assessed/performed Overall Cognitive Status: Within Functional Limits for tasks assessed  General Comments      Exercises Total Joint Exercises Ankle Circles/Pumps: Strengthening;10 reps Quad Sets: Strengthening;10 reps Gluteal Sets: Strengthening;10 reps Heel Slides: Strengthening;AROM;5 reps Hip ABduction/ADduction: Strengthening;5 reps Straight Leg Raises: AAROM;5 reps(pt able to show some against gravity resist, but no AROM) Knee Flexion: PROM;5 reps Goniometric ROM: 2-71   Assessment/Plan    PT Assessment Patient needs continued PT services  PT Problem List Decreased strength;Decreased range of motion;Decreased activity tolerance;Decreased mobility;Decreased balance;Decreased coordination;Decreased knowledge of use of DME;Decreased safety awareness;Decreased knowledge of precautions;Pain       PT Treatment Interventions DME  instruction;Gait training;Stair training;Functional mobility training;Therapeutic activities;Therapeutic exercise;Balance training;Patient/family education;Neuromuscular re-education    PT Goals (Current goals can be found in the Care Plan section)  Acute Rehab PT Goals Patient Stated Goal: go home PT Goal Formulation: With patient Time For Goal Achievement: 07/27/17 Potential to Achieve Goals: Good    Frequency BID   Barriers to discharge        Co-evaluation               AM-PAC PT "6 Clicks" Daily Activity  Outcome Measure Difficulty turning over in bed (including adjusting bedclothes, sheets and blankets)?: None Difficulty moving from lying on back to sitting on the side of the bed? : A Little Difficulty sitting down on and standing up from a chair with arms (e.g., wheelchair, bedside commode, etc,.)?: None Help needed moving to and from a bed to chair (including a wheelchair)?: None Help needed walking in hospital room?: A Little Help needed climbing 3-5 steps with a railing? : A Little 6 Click Score: 21    End of Session Equipment Utilized During Treatment: Gait belt Activity Tolerance: Patient limited by pain Patient left: with chair alarm set;with call bell/phone within reach;with family/visitor present Nurse Communication: Mobility status PT Visit Diagnosis: Muscle weakness (generalized) (M62.81);Difficulty in walking, not elsewhere classified (R26.2)    Time: 1540-0867 PT Time Calculation (min) (ACUTE ONLY): 29 min   Charges:   PT Evaluation $PT Eval Low Complexity: 1 Low PT Treatments $Therapeutic Exercise: 8-22 mins   PT G Codes:   PT G-Codes **NOT FOR INPATIENT CLASS** Functional Assessment Tool Used: AM-PAC 6 Clicks Basic Mobility Functional Limitation: Mobility: Walking and moving around Mobility: Walking and Moving Around Current Status (Y1950): At least 20 percent but less than 40 percent impaired, limited or restricted Mobility: Walking and  Moving Around Goal Status [phone]): At least 1 percent but less than 20 percent impaired, limited or restricted    Kreg Shropshire, DPT 07/13/2017, 5:49 PM

## 2017-07-13 NOTE — NC FL2 (Signed)
[place] LEVEL OF CARE SCREENING TOOL     IDENTIFICATION  Patient Name: Maureen [name] Birthdate: 01-May-1956 Sex: female Admission Date (Current Location): 07/13/2017  [place] and [place] Number:  Engineering geologist and Address:  [facility], [address] [place], [place] [address]      Provider Number: [id number]  Attending Physician Name and Address:  Hessie Knows, MD  Relative Name and Phone Number:       Current Level of Care: Hospital Recommended Level of Care: [place] Prior Approval Number:    Date Approved/Denied:   PASRR Number: ([id number])  Discharge Plan: SNF    Current Diagnoses: Patient Active Problem List   Diagnosis Date Noted  . Primary localized osteoarthritis of left knee 07/13/2017  . Primary osteoarthritis of right knee 02/16/2016  . Anemia 12/04/2015  . Female genuine stress incontinence 10/29/2015  . Arthritis, degenerative 10/29/2015  . Allergic rhinitis 10/29/2015  . Prediabetes 12/03/2014  . Hyperlipidemia 12/03/2014  . Morbid obesity ([place]) 12/03/2014  . Vitamin D deficiency 12/03/2014  . Medication management 12/03/2014  . IBS (irritable bowel syndrome)   . GERD (gastroesophageal reflux disease)   . Hypertension   . Asthma   . LAP-BAND surgery status 01/17/2013    Orientation RESPIRATION BLADDER Height & Weight     Self, Time, Situation, Place  Normal Continent Weight: 233 lb (105.7 kg) Height:  5\' 1"  (154.9 cm)  BEHAVIORAL SYMPTOMS/MOOD NEUROLOGICAL BOWEL NUTRITION STATUS      Continent Diet  AMBULATORY STATUS COMMUNICATION OF NEEDS Skin   Extensive Assist Verbally Surgical wounds(Inicison Left Knee)                       Personal Care Assistance Level of Assistance  Bathing, Feeding, Dressing Bathing Assistance: Limited assistance Feeding assistance: Independent Dressing Assistance: Limited assistance     Functional Limitations Info  Sight, Hearing,  Speech Sight Info: Adequate Hearing Info: Adequate Speech Info: Adequate    SPECIAL CARE FACTORS FREQUENCY  PT (By licensed PT), OT (By licensed OT)     PT Frequency: (5) OT Frequency: (5)            Contractures      Additional Factors Info  Code Status, Allergies Code Status Info: (Full Code) Allergies Info: (MUCINEX GUAIFENESIN ER, TRAMADOL )           Current Medications (07/13/2017):  This is the current hospital active medication list Current Facility-Administered Medications  Medication Dose Route Frequency Provider Last Rate Last Dose  . 0.9 %  sodium chloride infusion   Intravenous Continuous Hessie Knows, MD      . acetaminophen (TYLENOL) tablet 650 mg  650 mg Oral Q4H PRN Hessie Knows, MD       Or  . acetaminophen (TYLENOL) suppository 650 mg  650 mg Rectal Q4H PRN Hessie Knows, MD      . bisacodyl (DULCOLAX) suppository 10 mg  10 mg Rectal Daily PRN Hessie Knows, MD      . ceFAZolin (ANCEF) 2 g in dextrose 5 % 100 mL IVPB  2 g Intravenous Q6H Hessie Knows, MD      . ceFAZolin (ANCEF) 2-4 GM/100ML-% IVPB           . diphenhydrAMINE (BENADRYL) 12.5 MG/5ML elixir 12.5-25 mg  12.5-25 mg Oral Q4H PRN Hessie Knows, MD      . docusate sodium (COLACE) capsule 100 mg  100 mg Oral BID Hessie Knows, MD      . [  START ON 07/14/2017] enoxaparin (LOVENOX) injection 30 mg  30 mg Subcutaneous Q12H Hessie Knows, MD      . hydrochlorothiazide (HYDRODIURIL) tablet 12.5 mg  12.5 mg Oral Daily Hessie Knows, MD      . hydrochlorothiazide (HYDRODIURIL) tablet 12.5 mg  12.5 mg Oral Daily Hessie Knows, MD      . ketorolac (ACULAR) 0.4 % ophthalmic solution 1 drop  1 drop Left Eye TID Hessie Knows, MD      . lisinopril (PRINIVIL,ZESTRIL) tablet 40 mg  40 mg Oral Daily Hessie Knows, MD      . loratadine (CLARITIN) tablet 10 mg  10 mg Oral Daily Hessie Knows, MD      . magnesium citrate solution 1 Bottle  1 Bottle Oral Once PRN Hessie Knows, MD      . magnesium hydroxide  (MILK OF MAGNESIA) suspension 30 mL  30 mL Oral Daily PRN Hessie Knows, MD      . menthol-cetylpyridinium (CEPACOL) lozenge 3 mg  1 lozenge Oral PRN Hessie Knows, MD       Or  . phenol (CHLORASEPTIC) mouth spray 1 spray  1 spray Mouth/Throat PRN Hessie Knows, MD      . methocarbamol (ROBAXIN) tablet 500 mg  500 mg Oral Q6H PRN Hessie Knows, MD       Or  . methocarbamol (ROBAXIN) 500 mg in dextrose 5 % 50 mL IVPB  500 mg Intravenous Q6H PRN Hessie Knows, MD      . metoCLOPramide (REGLAN) tablet 5-10 mg  5-10 mg Oral Q8H PRN Hessie Knows, MD       Or  . metoCLOPramide (REGLAN) injection 5-10 mg  5-10 mg Intravenous Q8H PRN Hessie Knows, MD      . morphine 2 MG/ML injection 2 mg  2 mg Intravenous Q1H PRN Hessie Knows, MD      . multivitamin capsule 1 capsule  1 capsule Oral Daily Hessie Knows, MD      . ondansetron [facility]) tablet 4 mg  4 mg Oral Q6H PRN Hessie Knows, MD       Or  . ondansetron [facility]) injection 4 mg  4 mg Intravenous Q6H PRN Hessie Knows, MD      . oxyCODONE (Oxy IR/ROXICODONE) immediate release tablet 10 mg  10 mg Oral Q3H PRN Hessie Knows, MD      . oxyCODONE (Oxy IR/ROXICODONE) immediate release tablet 5 mg  5 mg Oral Q3H PRN Hessie Knows, MD      . pantoprazole (PROTONIX) EC tablet 80 mg  80 mg Oral Daily Hessie Knows, MD      . venlafaxine XR (EFFEXOR-XR) 24 hr capsule 75 mg  75 mg Oral QHS Hessie Knows, MD      . zolpidem (AMBIEN) tablet 5 mg  5 mg Oral QHS PRN Hessie Knows, MD         Discharge Medications: Please see discharge summary for a list of discharge medications.  Relevant Imaging Results:  Relevant Lab Results:   Additional Information (SSN: [id number])  Smith Mince, Student-Social Work

## 2017-07-13 NOTE — Progress Notes (Signed)
ADMISSION:  Pt admitted to room 141 from PACU. Pt A&O X4. Surgical dressing clean dry and intact. Wound Vac in place. Sacral dressing on, bone foam applied, NS @ 52ml infusing, navigator completed. No complaints at this time. Bed in lowest position, call bell in reach and bed alarm on.

## 2017-07-13 NOTE — Anesthesia Procedure Notes (Signed)
Spinal  Start time: 07/13/2017 10:17 AM Staffing Performed: resident/CRNA  Preanesthetic Checklist Completed: patient identified, site marked, surgical consent, pre-op evaluation, timeout performed, IV checked, risks and benefits discussed and monitors and equipment checked Spinal Block Patient position: sitting Prep: Betadine Patient monitoring: heart rate, cardiac monitor, continuous pulse ox and blood pressure Approach: midline Location: L4-5 Injection technique: single-shot Needle Needle type: Quincke  Needle gauge: 24 G

## 2017-07-13 NOTE — Transfer of Care (Signed)
Immediate Anesthesia Transfer of Care Note  Patient: Maureen [name]  Procedure(s) Performed: TOTAL KNEE ARTHROPLASTY (Left )  Patient Location: PACU  Anesthesia Type:Spinal  Level of Consciousness: awake, alert  and oriented  Airway & Oxygen Therapy: Patient Spontanous Breathing  Post-op Assessment: Report given to RN and Post -op Vital signs reviewed and stable  Post vital signs: Reviewed and stable  Last Vitals:  Vitals:   07/13/17 0816  BP: 136/76  Pulse: 89  Resp: 16  Temp: 36.6 C  SpO2: 100%    Last Pain:  Vitals:   07/13/17 0816  TempSrc: Oral  PainSc: 4          Complications: No apparent anesthesia complications

## 2017-07-13 NOTE — Anesthesia Preprocedure Evaluation (Signed)
Anesthesia Evaluation  Patient identified by MRN, date of birth, ID band Patient awake    Reviewed: Allergy & Precautions, NPO status , Patient's Chart, lab work & pertinent test results  History of Anesthesia Complications (+) PONV and history of anesthetic complications  Airway Mallampati: II       Dental   Pulmonary asthma , sleep apnea (Pt denies) , former smoker,           Cardiovascular hypertension, Pt. on medications      Neuro/Psych    GI/Hepatic Neg liver ROS, GERD  Medicated and Poorly Controlled,  Endo/Other  neg diabetes  Renal/GU negative Renal ROS     Musculoskeletal   Abdominal   Peds  Hematology  (+) anemia ,   Anesthesia Other Findings   Reproductive/Obstetrics                             Anesthesia Physical Anesthesia Plan  ASA: III  Anesthesia Plan: Spinal   Post-op Pain Management:    Induction:   PONV Risk Score and Plan: 3 and Treatment may vary due to age or medical condition  Airway Management Planned:   Additional Equipment:   Intra-op Plan:   Post-operative Plan:   Informed Consent: I have reviewed the patients History and Physical, chart, labs and discussed the procedure including the risks, benefits and alternatives for the proposed anesthesia with the patient or authorized representative who has indicated his/her understanding and acceptance.     Plan Discussed with:   Anesthesia Plan Comments:         Anesthesia Quick Evaluation

## 2017-07-14 ENCOUNTER — Encounter: Payer: Self-pay | Admitting: Orthopedic Surgery

## 2017-07-14 LAB — CBC
HEMATOCRIT: 35.7 % (ref 35.0–47.0)
Hemoglobin: 12.2 g/dL (ref 12.0–16.0)
MCH: 33.7 pg (ref 26.0–34.0)
MCHC: 34.1 g/dL (ref 32.0–36.0)
MCV: 98.7 fL (ref 80.0–100.0)
Platelets: 223 10*3/uL (ref 150–440)
RBC: 3.61 MIL/uL — ABNORMAL LOW (ref 3.80–5.20)
RDW: 13.3 % (ref 11.5–14.5)
WBC: 8.4 10*3/uL (ref 3.6–11.0)

## 2017-07-14 LAB — BASIC METABOLIC PANEL
Anion gap: 9 (ref 5–15)
BUN: 11 mg/dL (ref 6–20)
CALCIUM: 9 mg/dL (ref 8.9–10.3)
CO2: 24 mmol/L (ref 22–32)
CREATININE: 0.68 mg/dL (ref 0.44–1.00)
Chloride: 99 mmol/L — ABNORMAL LOW (ref 101–111)
GFR calc non Af Amer: >60 mL/min (ref >60)
GLUCOSE: 168 mg/dL — AB (ref 65–99)
Potassium: 3.8 mmol/L (ref 3.5–5.1)
Sodium: 132 mmol/L — ABNORMAL LOW (ref 135–145)

## 2017-07-14 MED ORDER — HYDROCODONE-ACETAMINOPHEN 5-325 MG PO TABS
2.0000 | ORAL_TABLET | ORAL | Status: DC | PRN
  Administered 2017-07-14 – 2017-07-16 (×9): 2 via ORAL
  Filled 2017-07-14 (×10): qty 2

## 2017-07-14 MED ORDER — ENOXAPARIN SODIUM 40 MG/0.4ML ~~LOC~~ SOLN
40.0000 mg | Freq: Two times a day (BID) | SUBCUTANEOUS | Status: DC
  Administered 2017-07-14 – 2017-07-16 (×4): 40 mg via SUBCUTANEOUS
  Filled 2017-07-14 (×5): qty 0.4

## 2017-07-14 MED ORDER — HYDROMORPHONE HCL 1 MG/ML IJ SOLN: 0.5000 mg | INTRAMUSCULAR | Status: DC | PRN

## 2017-07-14 MED ORDER — HYDROCODONE-ACETAMINOPHEN 10-325 MG PO TABS
1.0000 | ORAL_TABLET | ORAL | Status: DC | PRN
  Administered 2017-07-14: 1 via ORAL
  Filled 2017-07-14: qty 1

## 2017-07-14 MED ORDER — KETOROLAC TROMETHAMINE 15 MG/ML IJ SOLN
15.0000 mg | Freq: Once | INTRAMUSCULAR | Status: AC
  Administered 2017-07-14: 15 mg via INTRAVENOUS
  Filled 2017-07-14: qty 1

## 2017-07-14 NOTE — Progress Notes (Addendum)
   Subjective: 1 Day Post-Op Procedure(s) (LRB): TOTAL KNEE ARTHROPLASTY (Left) Patient reports pain as moderate.  Has not had pain medicine since 2:30 this morning.Patient is well, and has had no acute complaints or problems Denies any CP, SOB, ABD pain. We will continue therapy today.  Plan is to go Home after hospital stay.  Objective: Vital signs in last 24 hours: Temp:  [97.6 F (36.4 C)-99.4 F (37.4 C)] 97.7 F (36.5 C) (11/14 0407) Pulse Rate:  [80-100] 94 (11/14 0407) Resp:  [10-18] 18 (11/14 0407) BP: (84-146)/(41-85) 146/81 (11/14 0407) SpO2:  [92 %-100 %] 97 % (11/14 0407) Weight:  [105.2 kg (232 lb)-105.7 kg (233 lb)] 105.2 kg (232 lb) (11/13 1331)  Intake/Output from previous day: 11/13 0701 - 11/14 0700 In: 2700 [I.V.:2480; IV Piggyback:220] Out: 2900 [Urine:2400; Blood:500] Intake/Output this shift: No intake/output data recorded.  Recent Labs    07/13/17 1401 07/14/17 0427  HGB 13.0 12.2   Recent Labs    07/13/17 1401 07/14/17 0427  WBC 7.4 8.4  RBC 3.93 3.61*  HCT 38.6 35.7  PLT 209 223   Recent Labs    07/13/17 1401 07/14/17 0427  NA  --  132*  K  --  3.8  CL  --  99*  CO2  --  24  BUN  --  11  CREATININE 0.61 0.68  GLUCOSE  --  168*  CALCIUM  --  9.0   No results for input(s): LABPT, INR in the last 72 hours.  EXAM General - Patient is Alert, Appropriate and Oriented Extremity - Neurovascular intact Sensation intact distally Intact pulses distally Dorsiflexion/Plantar flexion intact No cellulitis present Compartment soft Dressing - dressing C/D/I and no drainage wound VAC is intact  Motor Function - intact, moving foot and toes well on exam.   Past Medical History:  Diagnosis Date  . Allergy    seasonal  . Arthritis    "knees, back" (02/17/2016)  . GERD (gastroesophageal reflux disease)   . History of hiatal hernia   . Hypertension    HX OF HYPERTENSION PRIOR TO LAP BANDING - WAS TAKEN OFF BP MED  . IBS (irritable bowel  syndrome)   . Numbness    LEFT FOOT  . OSA (obstructive sleep apnea)    resolved with Lap Band/2nd test  . PONV (postoperative nausea and vomiting)    PONV with gallbladder - no PONV since that procedure    Assessment/Plan:   1 Day Post-Op Procedure(s) (LRB): TOTAL KNEE ARTHROPLASTY (Left) Active Problems:   Primary localized osteoarthritis of left knee  Estimated body mass index is 43.84 kg/m as calculated from the following:   Height as of this encounter: 5\' 1"  (1.549 m).   Weight as of this encounter: 105.2 kg (232 lb). Advance diet Up with therapy  Continue with current pain regimen Needs BM Recheck labs in the am CM to assist with discharge to home with HHPT  DVT Prophylaxis - Lovenox, Foot Pumps and TED hose Weight-Bearing as tolerated to left leg   T. Rachelle Hora, PA-C [place] 07/14/2017, 7:47 AM  Not doing well with PT.  Will add additional pain meds.  PT currently recommending SNF

## 2017-07-14 NOTE — Progress Notes (Signed)
Physical Therapy Treatment Patient Details Name: Maureen [name] MRN: [id number] DOB: 1956-02-07 Today's Date: 07/14/2017    History of Present Illness Pt. is a 61 y.o. female who was admitted to [facility] for a left TKR secondary to Osteoarthritis of the left knee. Pt. had a right TKR in 01/2016.    PT Comments    Pt was able to progress ambulatory distance to 75 ft with RW.  Pt also progressed number of repetitions and added seated exercises to therapeutic exercise routine.  Pt stated that she remembers some of her exercises from previous procedures and has been doing ankle pumps, etc.  Pt stated pain level of 8/10 upon initiation of PT and pain increase following ambulation and seated exercises.  Pt required use of bed rail and min assistance of L LE to perform sit to supine into bed but was otherwise able to manage bed mobility.  Pt will continue to benefit from skilled PT with focus on strength, ROM, tolerance to activity and pain management.   Follow Up Recommendations  Home health PT     Equipment Recommendations       Recommendations for Other Services       Precautions / Restrictions Precautions Precautions: Fall Restrictions Weight Bearing Restrictions: Yes LLE Weight Bearing: Weight bearing as tolerated    Mobility  Bed Mobility Overal bed mobility: Modified Independent             General bed mobility comments: Pt used bed rail to perform supine to sit and sit to supine.  Required min hand held assist to bring L LE over EOB.  Transfers Overall transfer level: Modified independent Equipment used: Rolling walker (2 wheeled)             General transfer comment: Pt required min VC's for use of UE during STS transfer but is able to perform physical task in a safe manner.  Ambulation/Gait     Assistive device: Rolling walker (2 wheeled) Gait Pattern/deviations: Step-through pattern;Decreased weight shift to left;Trunk flexed   Gait velocity interpretation: at  or above normal speed for age/gender General Gait Details: Pt demonstrated decreased foot clearance and step length of R LE.  PT provided VC's for management of RW in proximity to body.   Stairs            Wheelchair Mobility    Modified Rankin (Stroke Patients Only)       Balance Overall balance assessment: Modified Independent                                          Cognition Arousal/Alertness: Awake/alert Behavior During Therapy: WFL for tasks assessed/performed Overall Cognitive Status: Within Functional Limits for tasks assessed                                        Exercises Total Joint Exercises Ankle Circles/Pumps: Strengthening;Both;20 reps;Supine Quad Sets: Strengthening;Left;10 reps;Supine Heel Slides: Strengthening;Left;20 reps;Supine Hip ABduction/ADduction: Strengthening;Left;10 reps;Supine Knee Flexion: AROM;Left;5 reps;Seated Other Exercises Other Exercises: Seated marching x10 BLE with UE support Other Exercises: Seated hip abduction L x8 with UE support    General Comments        Pertinent Vitals/Pain Pain Assessment: 0-10 Pain Score: 8  Pain Descriptors / Indicators: Constant Pain Intervention(s): Limited activity within patient's tolerance;Monitored during  session    Home Living                      Prior Function            PT Goals (current goals can now be found in the care plan section) Acute Rehab PT Goals Patient Stated Goal: To be able to return home and complete home health PT. PT Goal Formulation: With patient Time For Goal Achievement: 07/28/17 Potential to Achieve Goals: Good Progress towards PT goals: Progressing toward goals    Frequency    BID      PT Plan Current plan remains appropriate    Co-evaluation              AM-PAC PT "6 Clicks" Daily Activity  Outcome Measure  Difficulty turning over in bed (including adjusting bedclothes, sheets and  blankets)?: A Little Difficulty moving from lying on back to sitting on the side of the bed? : A Little Difficulty sitting down on and standing up from a chair with arms (e.g., wheelchair, bedside commode, etc,.)?: A Little Help needed moving to and from a bed to chair (including a wheelchair)?: A Little Help needed walking in hospital room?: A Little Help needed climbing 3-5 steps with a railing? : A Lot 6 Click Score: 17    End of Session Equipment Utilized During Treatment: Gait belt Activity Tolerance: Patient tolerated treatment well;Patient limited by pain(Pt reported pain increase following seated exercises at EOB.) Patient left: in bed;with bed alarm set;with call bell/phone within reach   PT Visit Diagnosis: Unsteadiness on feet (R26.81);Other abnormalities of gait and mobility (R26.89);Muscle weakness (generalized) (M62.81);Difficulty in walking, not elsewhere classified (R26.2);Pain     Time: 1400-1425 PT Time Calculation (min) (ACUTE ONLY): 25 min  Charges:  $Gait Training: 8-22 mins $Therapeutic Exercise: 8-22 mins                    G Codes:  Functional Assessment Tool Used: AM-PAC 6 Clicks Basic Mobility    Roxanne Gates, PT, DPT   Roxanne Gates 07/14/2017, 2:50 PM

## 2017-07-14 NOTE — Anesthesia Postprocedure Evaluation (Signed)
Anesthesia Post Note  Patient: Maureen [name]  Procedure(s) Performed: TOTAL KNEE ARTHROPLASTY (Left )  Patient location during evaluation: Nursing Unit Anesthesia Type: Spinal Level of consciousness: oriented and awake and alert Pain management: pain level controlled Vital Signs Assessment: post-procedure vital signs reviewed and stable Respiratory status: spontaneous breathing and respiratory function stable Cardiovascular status: blood pressure returned to baseline and stable Postop Assessment: no headache, no backache and no apparent nausea or vomiting Anesthetic complications: no     Last Vitals:  Vitals:   07/14/17 0407 07/14/17 0815  BP: (!) 146/81 (!) 153/92  Pulse: 94 (!) 106  Resp: 18 16  Temp: 36.5 C 36.9 C  SpO2: 97% 97%    Last Pain:  Vitals:   07/14/17 0815  TempSrc: Oral  PainSc:                  Virgilio Frees

## 2017-07-14 NOTE — Progress Notes (Signed)
Clinical Social Worker (CSW) received SNF consult. PT is recommending home health. RN case manager aware of above. Please reconsult if future social work needs arise. CSW signing off.   Benjermin Korber, LCSW [phone] 

## 2017-07-14 NOTE — Care Management Note (Signed)
Case Management Note  Patient Details  Name: Maureen [name] MRN: [id number] Date of Birth: [birth year]  Subjective/Objective:                  Met with patient to discuss transition of care needs and discharge planning. She plans to return home with her husband which can provide transportation. She has a walker and bedside commode available for use at home.  Her Lovenox cost $5. She would like to use [facility] for home health services.  Action/Plan: Home health list provided to patient for review.  Referral to [facility]. RNCM will follow. Patient is aware and agrees to cost of Lovenox at [place].   Expected Discharge Date:                  Expected Discharge Plan:     In-House Referral:     Discharge planning Services  CM Consult  Post Acute Care Choice:  Home Health Choice offered to:  Patient  DME Arranged:    DME Agency:     HH Arranged:  PT [place]:  [facility] (now [facility])  Status of Service:  In process, will continue to follow  If discussed at Long Length of Stay Meetings, dates discussed:    Additional Comments:  Marshell Garfinkel, RN 07/14/2017, 8:52 AM

## 2017-07-14 NOTE — Progress Notes (Signed)
Physical Therapy Treatment Patient Details Name: Maureen [name] MRN: [id number] DOB: 1956/05/07 Today's Date: 07/14/2017    History of Present Illness Pt. is a 61 y.o. female who was admitted to [facility] for a left TKR secondary to Osteoarthritis of the left knee. Pt. had a right TKR in 01/2016.    PT Comments    Pt was able to progress number of repetitions of exercises from first therapy session today.  PT reviewed HEP and measured extension/flexion ROM of L knee: -5-60 deg.  Pt reported 0/10 pain at rest and 10/10 pain with all L LE activity.  PT educated pt concerning the importance of early ROM and muscle contraction for the healing process.  Pt expressed understanding and stated that she performed bed exercises between PT treatments.  Pt will continue to benefit from skilled PT with focus on ROM, strength, tolerance to activity, pain management and ambulation.  Follow Up Recommendations  Home health PT     Equipment Recommendations       Recommendations for Other Services       Precautions / Restrictions Precautions Precautions: Fall Restrictions Weight Bearing Restrictions: Yes LLE Weight Bearing: Weight bearing as tolerated    Mobility  Bed Mobility Overal bed mobility: (Performed bed exercises only.)             General bed mobility comments: Pt used bed rail to perform supine to sit and sit to supine.  Required min hand held assist to bring L LE over EOB.  Transfers Overall transfer level: (Performed bed exercises only.) Equipment used: Rolling walker (2 wheeled)             General transfer comment: Pt required min VC's for use of UE during STS transfer but is able to perform physical task in a safe manner.  Ambulation/Gait     Assistive device: Rolling walker (2 wheeled) Gait Pattern/deviations: Step-through pattern;Decreased weight shift to left;Trunk flexed   Gait velocity interpretation: at or above normal speed for age/gender General Gait Details:  Pt demonstrated decreased foot clearance and step length of R LE.  PT provided VC's for management of RW in proximity to body.   Stairs            Wheelchair Mobility    Modified Rankin (Stroke Patients Only)       Balance Overall balance assessment: (Performed bed exercises only.)                                          Cognition Arousal/Alertness: Awake/alert Behavior During Therapy: WFL for tasks assessed/performed Overall Cognitive Status: Within Functional Limits for tasks assessed                                        Exercises Total Joint Exercises Ankle Circles/Pumps: Strengthening;Both;20 reps;Supine Quad Sets: Strengthening;Left;20 reps Heel Slides: AROM;20 reps;Supine;Left Hip ABduction/ADduction: Strengthening;Left;15 reps;Supine Knee Flexion: AROM;Left;5 reps;Seated Goniometric ROM: Knee Ext/Flex: L: -5-60 deg. Other Exercises Other Exercises: Seated marching x10 BLE with UE support Other Exercises: Seated hip abduction L x8 with UE support    General Comments        Pertinent Vitals/Pain Pain Assessment: 0-10 Pain Score: 0-No pain Pain Location: Pt states that her pain is a 10/10 with movement and 0/10 at rest. Pain Descriptors / Indicators:  Stabbing Pain Intervention(s): Monitored during session;Premedicated before session    Home Living                      Prior Function            PT Goals (current goals can now be found in the care plan section) Acute Rehab PT Goals Patient Stated Goal: To return home and complete home health. PT Goal Formulation: With patient Time For Goal Achievement: 07/28/17 Potential to Achieve Goals: Good Progress towards PT goals: Progressing toward goals    Frequency    BID      PT Plan Current plan remains appropriate    Co-evaluation              AM-PAC PT "6 Clicks" Daily Activity  Outcome Measure  Difficulty turning over in bed (including  adjusting bedclothes, sheets and blankets)?: A Little Difficulty moving from lying on back to sitting on the side of the bed? : A Little Difficulty sitting down on and standing up from a chair with arms (e.g., wheelchair, bedside commode, etc,.)?: A Little Help needed moving to and from a bed to chair (including a wheelchair)?: A Little Help needed walking in hospital room?: A Little Help needed climbing 3-5 steps with a railing? : A Lot 6 Click Score: 17    End of Session Equipment Utilized During Treatment: Gait belt Activity Tolerance: Patient tolerated treatment well Patient left: in bed;with call bell/phone within reach   PT Visit Diagnosis: Pain;Muscle weakness (generalized) (M62.81) Pain - Right/Left: Left Pain - part of body: Knee     Time: 6387-5643 PT Time Calculation (min) (ACUTE ONLY): 25 min  Charges:  $Gait Training: 8-22 mins $Therapeutic Exercise: 23-37 mins                    G Codes:  Functional Assessment Tool Used: AM-PAC 6 Clicks Basic Mobility    Roxanne Gates, PT, DPT   Roxanne Gates 07/14/2017, 5:06 PM

## 2017-07-14 NOTE — Evaluation (Signed)
Occupational Therapy Evaluation Patient Details Name: Maureen [name] MRN: [id number] DOB: Jan 28, 1956 Today's Date: 07/14/2017    History of Present Illness Pt. is a 61 y.o. female who was admitted to [facility] for a left TKR secondary to Osteoarthritis of the left knee. Pt. had a rigth TKR in 01/2016.   Clinical Impression   Pt. Is a 61 y.o. Female who was  Admitted to [facility] for a left TKR secondary to Osteoarthritis of the left knee. Pt. is limited by 10/10 pain, weakness, limited functional mobility which limit her ability to complete basic ADL, and IADL functioning. Pt. Resides at home with her husband. Pt. was independent with ADLs, IADLs, meal preparation, medication management, driving, and was working at [employer]. Education was verbally provided about A/E use for LE ADLs. Pt. Reports being familiar with the equipment from recent previous surgery on the right knee. Pt. Could benefit from skilled OT services for ADL training, A/E training, and pt. Education about home modification, and DME. Pt. plans to return home with her husband who will assist with ADLS, and IADLs as needed.     Follow Up Recommendations  No OT follow up    Equipment Recommendations       Recommendations for Other Services       Precautions / Restrictions                                                      ADL either performed or assessed with clinical judgement   ADL Overall ADL's : Needs assistance/impaired Eating/Feeding: Set up;Independent   Grooming: Set up;Sitting;Independent   Upper Body Bathing: Set up   Lower Body Bathing: Maximal assistance(secondary to pain)   Upper Body Dressing : Set up   Lower Body Dressing: Maximal assistance(secondary to pain)               Functional mobility during ADLs: Min guard General ADL Comments: Pt. education was verbally provided about A/E use for LE ADLs secondary to pain.     Vision Patient Visual Report: No change from  baseline       Perception     Praxis      Pertinent Vitals/Pain Pain Assessment: 0-10 Pain Score: 10-Worst pain ever Pain Intervention(s): Monitored during session;Limited activity within patient's tolerance;Premedicated before session     Hand Dominance Right   Extremity/Trunk Assessment Upper Extremity Assessment Upper Extremity Assessment: Overall WFL for tasks assessed           Communication Communication Communication: No difficulties   Cognition Arousal/Alertness: Awake/alert Behavior During Therapy: WFL for tasks assessed/performed Overall Cognitive Status: Within Functional Limits for tasks assessed                                     General Comments       Exercises     Shoulder Instructions      Home Living Family/patient expects to be discharged to:: Private residence   Available Help at Discharge: Family Type of Home: House Home Access: Stairs to enter Technical brewer of Steps: 5 Entrance Stairs-Rails: Left Home Layout: Able to live on main level with bedroom/bathroom     Bathroom Shower/Tub: Walk-in shower;Door   [employer] Toilet: Standard     Home Equipment: None;Toilet  riser          Prior Functioning/Environment Level of Independence: Independent        Comments: Pt. was independent with ADLs, IADLs, meal preparation, medication management, working at [employer], and driving.        OT Problem List: Decreased strength;Decreased activity tolerance;Decreased knowledge of use of DME or AE;Decreased range of motion;Pain      OT Treatment/Interventions: Self-care/ADL training;Patient/family education;Energy conservation;Therapeutic activities;DME and/or AE instruction    OT Goals(Current goals can be found in the care plan section) Acute Rehab OT Goals Patient Stated Goal: To be able to walk normal, and go shopping without pain. OT Goal Formulation: With patient Potential to Achieve Goals: Good  OT Frequency:  Min 2X/week   Barriers to D/C:            Co-evaluation              AM-PAC PT "6 Clicks" Daily Activity     Outcome Measure Help from another person eating meals?: None Help from another person taking care of personal grooming?: None Help from another person toileting, which includes using toliet, bedpan, or urinal?: A Little Help from another person bathing (including washing, rinsing, drying)?: A Lot Help from another person to put on and taking off regular upper body clothing?: None Help from another person to put on and taking off regular lower body clothing?: A Lot 6 Click Score: 19   End of Session    Activity Tolerance: Patient tolerated treatment well Patient left: in bed;with call bell/phone within reach;with bed alarm set;with family/visitor present  OT Visit Diagnosis: Muscle weakness (generalized) (M62.81)                Time: 7591-6384 OT Time Calculation (min): 21 min Charges:  OT General Charges $OT Visit: 1 Visit OT Evaluation $OT Eval Low Complexity: 1 Low G-Codes: OT G-codes **NOT FOR INPATIENT CLASS** Functional Limitation: Self care Self Care Current Status (Y6599): At least 40 percent but less than 60 percent impaired, limited or restricted Self Care Goal Status (J5701): 0 percent impaired, limited or restricted   Harrel Carina, MS, OTR/L   Harrel Carina, MS, OTR/L 07/14/2017, 10:26 AM

## 2017-07-14 NOTE — Progress Notes (Signed)
Anticoagulation monitoring(Lovenox):  61yo  F ordered Lovenox 30 mg Q12h  Filed Weights   07/13/17 0816 07/13/17 1331  Weight: 233 lb (105.7 kg) 232 lb (105.2 kg)   BMI 43.86   Lab Results  Component Value Date   CREATININE 0.68 07/14/2017   CREATININE 0.61 07/13/2017   CREATININE 1.10 (H) 06/30/2017   Estimated Creatinine Clearance: 81.5 mL/min (by C-G formula based on SCr of 0.68 mg/dL). Hemoglobin & Hematocrit     Component Value Date/Time   HGB 12.2 07/14/2017 0427   HGB 13.5 05/26/2017 1653   HCT 35.7 07/14/2017 0427   HCT 39.0 05/26/2017 1653     Per Protocol for Patient with estCrcl > 30 ml/min and BMI > 40, will transition to Lovenox 40 mg Q12h.      Maureen [name] PharmD Clinical Pharmacist 07/14/2017

## 2017-07-14 NOTE — Plan of Care (Signed)
1. Pt. Will be modified independent with LE dressing. 2. Pt. Will be Modified independent with toileting skills.

## 2017-07-15 LAB — BASIC METABOLIC PANEL
ANION GAP: 6 (ref 5–15)
BUN: 13 mg/dL (ref 6–20)
CALCIUM: 8.8 mg/dL — AB (ref 8.9–10.3)
CO2: 29 mmol/L (ref 22–32)
Chloride: 101 mmol/L (ref 101–111)
Creatinine, Ser: 0.68 mg/dL (ref 0.44–1.00)
GFR calc Af Amer: >60 mL/min (ref >60)
GFR calc non Af Amer: >60 mL/min (ref >60)
GLUCOSE: 124 mg/dL — AB (ref 65–99)
POTASSIUM: 3.9 mmol/L (ref 3.5–5.1)
Sodium: 136 mmol/L (ref 135–145)

## 2017-07-15 LAB — CBC
HEMATOCRIT: 33.6 % — AB (ref 35.0–47.0)
Hemoglobin: 11.3 g/dL — ABNORMAL LOW (ref 12.0–16.0)
MCH: 33.2 pg (ref 26.0–34.0)
MCHC: 33.7 g/dL (ref 32.0–36.0)
MCV: 98.5 fL (ref 80.0–100.0)
Platelets: 203 10*3/uL (ref 150–440)
RBC: 3.41 MIL/uL — ABNORMAL LOW (ref 3.80–5.20)
RDW: 13.5 % (ref 11.5–14.5)
WBC: 7.3 10*3/uL (ref 3.6–11.0)

## 2017-07-15 MED ORDER — ENOXAPARIN SODIUM 40 MG/0.4ML ~~LOC~~ SOLN: 40.0000 mg | SUBCUTANEOUS | 0 refills | Status: DC

## 2017-07-15 MED ORDER — ENOXAPARIN SODIUM 40 MG/0.4ML ~~LOC~~ SOLN: 40.0000 mg | Freq: Two times a day (BID) | SUBCUTANEOUS | 0 refills | Status: DC

## 2017-07-15 MED ORDER — HYDROCODONE-ACETAMINOPHEN 5-325 MG PO TABS: 1.0000 | ORAL_TABLET | ORAL | 0 refills | Status: DC | PRN

## 2017-07-15 NOTE — Progress Notes (Signed)
   Subjective: 2 Days Post-Op Procedure(s) (LRB): TOTAL KNEE ARTHROPLASTY (Left) Patient reports pain as moderate.  .Patient is well, and has had no acute complaints or problems. Pain medication is still working occasionally. She has had more pain during the evening. Denies any CP, SOB, ABD pain. We will continue therapy today.  Plan is to go Home after hospital stay.  Objective: Vital signs in last 24 hours: Temp:  [98.5 F (36.9 C)-98.7 F (37.1 C)] 98.7 F (37.1 C) (11/14 2046) Pulse Rate:  [85-106] 96 (11/14 2046) Resp:  [15-16] 15 (11/14 2046) BP: (124-153)/(60-92) 124/60 (11/14 2046) SpO2:  [96 %-99 %] 96 % (11/14 2046)  Intake/Output from previous day: 11/14 0701 - 11/15 0700 In: 240 [P.O.:240] Out: -  Intake/Output this shift: Total I/O In: 240 [P.O.:240] Out: -   Recent Labs    07/13/17 1401 07/14/17 0427 07/15/17 0319  HGB 13.0 12.2 11.3*   Recent Labs    07/14/17 0427 07/15/17 0319  WBC 8.4 7.3  RBC 3.61* 3.41*  HCT 35.7 33.6*  PLT 223 203   Recent Labs    07/14/17 0427 07/15/17 0319  NA 132* 136  K 3.8 3.9  CL 99* 101  CO2 24 29  BUN 11 13  CREATININE 0.68 0.68  GLUCOSE 168* 124*  CALCIUM 9.0 8.8*   No results for input(s): LABPT, INR in the last 72 hours.  EXAM General - Patient is Alert, Appropriate and Oriented Extremity - Neurovascular intact Sensation intact distally Intact pulses distally Dorsiflexion/Plantar flexion intact No cellulitis present Compartment soft Dressing - dressing C/D/I and no drainage wound VAC is intact  Motor Function - intact, moving foot and toes well on exam.   Past Medical History:  Diagnosis Date  . Allergy    seasonal  . Arthritis    "knees, back" (02/17/2016)  . GERD (gastroesophageal reflux disease)   . History of hiatal hernia   . Hypertension    HX OF HYPERTENSION PRIOR TO LAP BANDING - WAS TAKEN OFF BP MED  . IBS (irritable bowel syndrome)   . Numbness    LEFT FOOT  . OSA (obstructive  sleep apnea)    resolved with Lap Band/2nd test  . PONV (postoperative nausea and vomiting)    PONV with gallbladder - no PONV since that procedure    Assessment/Plan:   2 Days Post-Op Procedure(s) (LRB): TOTAL KNEE ARTHROPLASTY (Left) Active Problems:   Primary localized osteoarthritis of left knee  Estimated body mass index is 43.84 kg/m as calculated from the following:   Height as of this encounter: 5\' 1"  (1.549 m).   Weight as of this encounter: 105.2 kg (232 lb). Advance diet Up with therapy  Continue with current pain regimen Needs BM  CM to assist with discharge to home with HHPT. Plan for discharge home on Friday.  DVT Prophylaxis - Lovenox, Foot Pumps and TED hose Weight-Bearing as tolerated to left leg   Reche Dixon, PA-C [place] 07/15/2017, 6:16 AM

## 2017-07-15 NOTE — Discharge Instructions (Signed)
TOTAL KNEE REPLACEMENT POSTOPERATIVE DIRECTIONS  Knee Rehabilitation, Guidelines Following Surgery  Results after knee surgery are often greatly improved when you follow the exercise, range of motion and muscle strengthening exercises prescribed by your doctor. Safety measures are also important to protect the knee from further injury. Any time any of these exercises cause you to have increased pain or swelling in your knee joint, decrease the amount until you are comfortable again and slowly increase them. If you have problems or questions, call your caregiver or physical therapist for advice.   HOME CARE INSTRUCTIONS  Remove items at home which could result in a fall. This includes throw rugs or furniture in walking pathways.   ICE using the Polar Care unit to the affected knee every three hours for 30 minutes at a time and then as needed for pain and swelling.  Place a dry towel or pillow case over the knee before applying the Polar Care Unit.  Continue to use ice on the knee for pain and swelling from surgery. You may notice swelling that will progress down to the foot and ankle.  This is normal after surgery.  Elevate the leg when you are not up walking on it.    Continue to use the breathing machine which will help keep your temperature down.  It is common for your temperature to cycle up and down following surgery, especially at night when you are not up moving around and exerting yourself.  The breathing machine keeps your lungs expanded and your temperature down.  Do not place pillow under knee, focus on keeping the knee straight while resting  DIET You may resume your previous home diet once your are discharged from the hospital.  DRESSING / WOUND CARE / SHOWERING  Remove wound VAC and apply honeycomb dressing on 07/23/2017.   ACTIVITY Walk with your walker as instructed. Use walker as long as suggested by your caregivers. Avoid periods of inactivity such as sitting longer than an  hour when not asleep. This helps prevent blood clots.  You may resume a sexual relationship in one month or when given the OK by your doctor.  You may return to work once you are cleared by your doctor.  Do not drive a car for 6 weeks or until released by you surgeon.  Do not drive while taking narcotics.  WEIGHT BEARING Weight bearing as tolerated with assist device (walker, cane, etc) as directed, use it as long as suggested by your surgeon or therapist, typically at least 4-6 weeks.  POSTOPERATIVE CONSTIPATION PROTOCOL Constipation - defined medically as fewer than three stools per week and severe constipation as less than one stool per week.  One of the most common issues patients have following surgery is constipation.  Even if you have a regular bowel pattern at home, your normal regimen is likely to be disrupted due to multiple reasons following surgery.  Combination of anesthesia, postoperative narcotics, change in appetite and fluid intake all can affect your bowels.  In order to avoid complications following surgery, here are some recommendations in order to help you during your recovery period.  Colace (docusate) - Pick up an over-the-counter form of Colace or another stool softener and take twice a day as long as you are requiring postoperative pain medications.  Take with a full glass of water daily.  If you experience loose stools or diarrhea, hold the colace until you stool forms back up.  If your symptoms do not get better within 1 week  or if they get worse, check with your doctor.  Dulcolax (bisacodyl) - Pick up over-the-counter and take as directed by the product packaging as needed to assist with the movement of your bowels.  Take with a full glass of water.  Use this product as needed if not relieved by Colace only.   MiraLax (polyethylene glycol) - Pick up over-the-counter to have on hand.  MiraLax is a solution that will increase the amount of water in your bowels to assist  with bowel movements.  Take as directed and can mix with a glass of water, juice, soda, coffee, or tea.  Take if you go more than two days without a movement. Do not use MiraLax more than once per day. Call your doctor if you are still constipated or irregular after using this medication for 7 days in a row.  If you continue to have problems with postoperative constipation, please contact the office for further assistance and recommendations.  If you experience "the worst abdominal pain ever" or develop nausea or vomiting, please contact the office immediatly for further recommendations for treatment.  ITCHING  If you experience itching with your medications, try taking only a single pain pill, or even half a pain pill at a time.  You can also use Benadryl over the counter for itching or also to help with sleep.   TED HOSE STOCKINGS Wear the elastic stockings on both legs for six weeks following surgery during the day but you may remove then at night for sleeping.  MEDICATIONS See your medication summary on the After Visit Summary that the nursing staff will review with you prior to discharge.  You may have some home medications which will be placed on hold until you complete the course of blood thinner medication.  It is important for you to complete the blood thinner medication as prescribed by your surgeon.  Continue your approved medications as instructed at time of discharge.  PRECAUTIONS If you experience chest pain or shortness of breath - call 911 immediately for transfer to the hospital emergency department.  If you develop a fever greater that 101 F, purulent drainage from wound, increased redness or drainage from wound, foul odor from the wound/dressing, or calf pain - CONTACT YOUR SURGEON.                                                   FOLLOW-UP APPOINTMENTS Make sure you keep all of your appointments after your operation with your surgeon and caregivers. You should call the  office at the above phone number and make an appointment for approximately two weeks after the date of your surgery or on the date instructed by your surgeon outlined in the "After Visit Summary".   RANGE OF MOTION AND STRENGTHENING EXERCISES  Rehabilitation of the knee is important following a knee injury or an operation. After just a few days of immobilization, the muscles of the thigh which control the knee become weakened and shrink (atrophy). Knee exercises are designed to build up the tone and strength of the thigh muscles and to improve knee motion. Often times heat used for twenty to thirty minutes before working out will loosen up your tissues and help with improving the range of motion but do not use heat for the first two weeks following surgery. These exercises can be done on  a training (exercise) mat, on the floor, on a table or on a bed. Use what ever works the best and is most comfortable for you Knee exercises include:  Leg Lifts - While your knee is still immobilized in a splint or cast, you can do straight leg raises. Lift the leg to 60 degrees, hold for 3 sec, and slowly lower the leg. Repeat 10-20 times 2-3 times daily. Perform this exercise against resistance later as your knee gets better.  Quad and Hamstring Sets - Tighten up the muscle on the front of the thigh (Quad) and hold for 5-10 sec. Repeat this 10-20 times hourly. Hamstring sets are done by pushing the foot backward against an object and holding for 5-10 sec. Repeat as with quad sets.   Leg Slides: Lying on your back, slowly slide your foot toward your buttocks, bending your knee up off the floor (only go as far as is comfortable). Then slowly slide your foot back down until your leg is flat on the floor again.  Angel Wings: Lying on your back spread your legs to the side as far apart as you can without causing discomfort.  A rehabilitation program following serious knee injuries can speed recovery and prevent re-injury in  the future due to weakened muscles. Contact your doctor or a physical therapist for more information on knee rehabilitation.   IF YOU ARE TRANSFERRED TO A SKILLED REHAB FACILITY If the patient is transferred to a skilled rehab facility following release from the hospital, a list of the current medications will be sent to the facility for the patient to continue.  When discharged from the skilled rehab facility, please have the facility set up the patient's [place] prior to being released. Also, the skilled facility will be responsible for providing the patient with their medications at time of release from the facility to include their pain medication, the muscle relaxants, and their blood thinner medication. If the patient is still at the rehab facility at time of the two week follow up appointment, the skilled rehab facility will also need to assist the patient in arranging follow up appointment in our office and any transportation needs.  MAKE SURE YOU:  Understand these instructions.  Get help right away if you are not doing well or get worse.    Pick up stool softner and laxative for home use following surgery while on pain medications. Do not submerge incision under water. Please use good hand washing techniques while changing dressing each day. May shower starting three days after surgery. Please use a clean towel to pat the incision dry following showers. Continue to use ice for pain and swelling after surgery. Do not use any lotions or creams on the incision until instructed by your surgeon.

## 2017-07-15 NOTE — Progress Notes (Signed)
Physical Therapy Treatment Patient Details Name: Maureen [name] MRN: [id number] DOB: October 14, 1955 Today's Date: 07/15/2017    History of Present Illness Pt. is a 61 y.o. female who was admitted to [facility] for a left TKR secondary to Osteoarthritis of the left knee. Pt. had a right TKR in 01/2016.    PT Comments    Pt was able to progress distance of ambulation today and add stair negotiation to treatment.  Pt was able to ascend/descend 4 steps with bilateral handrails and CGA with VC's for which foot is appropriate to lead.  Pt required 4 rest breaks during ambulation and reported shoulder fatigue.  Pt was better able to negotiate RW after it was lowered by PT.  Pt progressed 5 degrees in knee flexion and remains at -5 degrees for extension.  Pt states that she has been doing Hep between PT treatments.  Pt will continue to benefit from skilled PT with focus on strength, ROM, tolerance to activity and stair negotiation.   Follow Up Recommendations  Home health PT     Equipment Recommendations       Recommendations for Other Services       Precautions / Restrictions Precautions Precautions: Fall;Knee Restrictions Weight Bearing Restrictions: Yes LLE Weight Bearing: Weight bearing as tolerated    Mobility  Bed Mobility Overal bed mobility: Modified Independent             General bed mobility comments: Pt requires manual assist to initiate movement of L LE over EOB but is able to perform sit to supine, scooting and bridging with VC's from PT.  Transfers Overall transfer level: Modified independent Equipment used: Rolling walker (2 wheeled)             General transfer comment: Pt required less time to perform STS with RW and required no VC's for hand placement.  Ambulation/Gait Ambulation/Gait assistance: Modified independent (Device/Increase time) Ambulation Distance (Feet): 250 Feet Assistive device: Rolling walker (2 wheeled) Gait Pattern/deviations: Step-through  pattern;Decreased stance time - left;Decreased weight shift to left   Gait velocity interpretation: at or above normal speed for age/gender General Gait Details: Pt presents with decreased knee and hip flexion with L LE in swing phase; relies on lateral wt displacement for foot clearance.  Pt gait speed increased and pt was able to maintain a reciprocal gait pattern.  Required 4 rest breaks with report of L knee pain and fatigue in shoulders.  PT adjusted RW and pt was better able to manage.   Stairs Stairs: Yes   Stair Management: Two rails;Step to pattern;Forwards Number of Stairs: 4 General stair comments: Step to gait patter leading with R LE to ascend and L LE to descend.  Pt required no rest break and was able to complete with close CGA.  Wheelchair Mobility    Modified Rankin (Stroke Patients Only)       Balance Overall balance assessment: Modified Independent                                          Cognition Arousal/Alertness: Awake/alert Behavior During Therapy: WFL for tasks assessed/performed Overall Cognitive Status: Within Functional Limits for tasks assessed                                        Exercises  Total Joint Exercises Quad Sets: Strengthening;Left;20 reps;Supine Gluteal Sets: Strengthening;Both;10 reps;Supine Short Arc Quad: Strengthening;Left;5 reps Heel Slides: AAROM;Left;10 reps;Supine Hip ABduction/ADduction: Strengthening;Left;10 reps;Supine Goniometric ROM: Knee Ext/Flex: L: -5-65 degrees Other Exercises Other Exercises: STS x5 mod. I.  Pt demonstrates improvement in body  mechanics and expresses understanding of importance of knee flexion for safe and energy efficient STS.    General Comments        Pertinent Vitals/Pain Pain Assessment: 0-10 Pain Score: 8  Pain Location: Reports an 8/10 with activity and states that her pain has changed today.  She feels a stinging pain at the inferior portion of  incision. Pain Descriptors / Indicators: Hervey Ard;Stabbing Pain Intervention(s): Monitored during session;Premedicated before session    Home Living                      Prior Function            PT Goals (current goals can now be found in the care plan section) Acute Rehab PT Goals Patient Stated Goal: To return to walking with less pain and at a normal speed. PT Goal Formulation: With patient Time For Goal Achievement: 07/29/17 Potential to Achieve Goals: Good Progress towards PT goals: Progressing toward goals    Frequency    BID      PT Plan Current plan remains appropriate    Co-evaluation              AM-PAC PT "6 Clicks" Daily Activity  Outcome Measure  Difficulty turning over in bed (including adjusting bedclothes, sheets and blankets)?: A Little Difficulty moving from lying on back to sitting on the side of the bed? : A Little Difficulty sitting down on and standing up from a chair with arms (e.g., wheelchair, bedside commode, etc,.)?: A Little Help needed moving to and from a bed to chair (including a wheelchair)?: A Little Help needed walking in hospital room?: A Little Help needed climbing 3-5 steps with a railing? : A Little 6 Click Score: 18    End of Session Equipment Utilized During Treatment: Gait belt Activity Tolerance: Patient tolerated treatment well Patient left: in bed;with call bell/phone within reach   PT Visit Diagnosis: Other abnormalities of gait and mobility (R26.89);Unsteadiness on feet (R26.81);Muscle weakness (generalized) (M62.81);Pain Pain - Right/Left: Left Pain - part of body: Knee     Time: 1350-1415 PT Time Calculation (min) (ACUTE ONLY): 25 min  Charges:  $Gait Training: 8-22 mins $Therapeutic Exercise: 8-22 mins $Therapeutic Activity: 8-22 mins                    G Codes:  Functional Assessment Tool Used: AM-PAC 6 Clicks Basic Mobility Functional Limitation: Mobility: Walking and moving around    Roxanne Gates, PT, DPT   Roxanne Gates 07/15/2017, 2:26 PM

## 2017-07-15 NOTE — Discharge Summary (Signed)
Physician Discharge Summary  Patient ID: Maureen [name] MRN: [id number] DOB/AGE: [birth year] 61 y.o.  Admit date: 07/13/2017 Discharge date: 07/16/2017 Admission Diagnoses:  PRIMARY LOCALIZED OSTEOARTHRITIS OF LEFT KNEE   Discharge Diagnoses: Patient Active Problem List   Diagnosis Date Noted  . Primary localized osteoarthritis of left knee 07/13/2017  . Primary osteoarthritis of right knee 02/16/2016  . Anemia 12/04/2015  . Female genuine stress incontinence 10/29/2015  . Arthritis, degenerative 10/29/2015  . Allergic rhinitis 10/29/2015  . Prediabetes 12/03/2014  . Hyperlipidemia 12/03/2014  . Morbid obesity ([place]) 12/03/2014  . Vitamin D deficiency 12/03/2014  . Medication management 12/03/2014  . IBS (irritable bowel syndrome)   . GERD (gastroesophageal reflux disease)   . Hypertension   . Asthma   . LAP-BAND surgery status 01/17/2013    Past Medical History:  Diagnosis Date  . Allergy    seasonal  . Arthritis    "knees, back" (02/17/2016)  . GERD (gastroesophageal reflux disease)   . History of hiatal hernia   . Hypertension    HX OF HYPERTENSION PRIOR TO LAP BANDING - WAS TAKEN OFF BP MED  . IBS (irritable bowel syndrome)   . Numbness    LEFT FOOT  . OSA (obstructive sleep apnea)    resolved with Lap Band/2nd test  . PONV (postoperative nausea and vomiting)    PONV with gallbladder - no PONV since that procedure     Transfusion: none   Consultants (if any):   Discharged Condition: Improved  Hospital Course: Maureen [name] is an 61 y.o. female who was admitted 07/13/2017 with a diagnosis of left knee osteoarthritis and went to the operating room on 07/13/2017 and underwent the above named procedures.    Surgeries: Procedure(s): TOTAL KNEE ARTHROPLASTY on 07/13/2017 Patient tolerated the surgery well. Taken to PACU where she was stabilized and then transferred to the orthopedic floor.  Started on Lovenox 40 q 12 hrs. Foot pumps applied bilaterally at 80  mm. Heels elevated on bed with rolled towels. No evidence of DVT. Negative Homan. Physical therapy started on day #1 for gait training and transfer. OT started day #1 for ADL and assisted devices.  Patient's foley was d/c on day #1. Patient's IV  was d/c on day #2.  On post op day #3 patient was stable and ready for discharge to home with HHPT.  Implants:  Medacta GMK sphere 3 femur, 2 tibia was short stem and 17 mm insert and 2 patella, all components cemented    She was given perioperative antibiotics:  Anti-infectives (From admission, onward)   Start     Dose/Rate Route Frequency Ordered Stop   07/13/17 1600  ceFAZolin (ANCEF) 2 g in dextrose 5 % 100 mL IVPB     2 g 240 mL/hr over 30 Minutes Intravenous Every 6 hours 07/13/17 1331 07/14/17 0502   07/13/17 0810  ceFAZolin (ANCEF) 2-4 GM/100ML-% IVPB    Comments:  Hallaji, Violet   : cabinet override      07/13/17 0810 07/13/17 2014   07/13/17 0230  ceFAZolin (ANCEF) IVPB 2g/100 mL premix  Status:  Discontinued     2 g 200 mL/hr over 30 Minutes Intravenous  Once 07/13/17 0217 07/13/17 1327    .  She was given sequential compression devices, early ambulation, and Lovenox for DVT prophylaxis.  She benefited maximally from the hospital stay and there were no complications.    Recent vital signs:  Vitals:   07/14/17 2046 07/15/17 0803  BP: 124/60 Marland Kitchen)  152/81  Pulse: 96 96  Resp: 15 18  Temp: 98.7 F (37.1 C) 98.5 F (36.9 C)  SpO2: 96% 93%    Recent laboratory studies:  Lab Results  Component Value Date   HGB 11.3 (L) 07/15/2017   HGB 12.2 07/14/2017   HGB 13.0 07/13/2017   Lab Results  Component Value Date   WBC 7.3 07/15/2017   PLT 203 07/15/2017   Lab Results  Component Value Date   INR 0.97 06/30/2017   Lab Results  Component Value Date   NA 136 07/15/2017   K 3.9 07/15/2017   CL 101 07/15/2017   CO2 29 07/15/2017   BUN 13 07/15/2017   CREATININE 0.68 07/15/2017   GLUCOSE 124 (H) 07/15/2017     Discharge Medications:   Allergies as of 07/15/2017      Reactions   Mucinex [guaifenesin Er] Other (See Comments)   "feels weird and dizzy"   Tramadol Itching      Medication List    STOP taking these medications   prednisoLONE acetate 1 % ophthalmic suspension Commonly known as:  PRED FORTE     TAKE these medications   acetaminophen 500 MG tablet Commonly known as:  TYLENOL Take 1,000 mg by mouth every 6 (six) hours as needed for moderate pain or headache.   diclofenac 75 MG EC tablet Commonly known as:  VOLTAREN Take 1 tablet (75 mg total) by mouth 2 (two) times daily. What changed:  when to take this   enoxaparin 40 MG/0.4ML injection Commonly known as:  LOVENOX Inject 0.4 mLs (40 mg total) daily into the skin.   hydrochlorothiazide 12.5 MG tablet Commonly known as:  HYDRODIURIL Take 1 tablet (12.5 mg total) by mouth daily.   hydrochlorothiazide 12.5 MG tablet Commonly known as:  HYDRODIURIL TAKE 1 TABLET BY MOUTH ONCE DAILY   HYDROcodone-acetaminophen 5-325 MG tablet Commonly known as:  NORCO/VICODIN Take 1-2 tablets every 4 (four) hours as needed by mouth for moderate pain.   ketorolac 0.4 % Soln Commonly known as:  ACULAR Place 1 drop into the left eye 3 (three) times daily. What changed:  Another medication with the same name was removed. Continue taking this medication, and follow the directions you see here.   lisinopril 40 MG tablet Commonly known as:  PRINIVIL,ZESTRIL TAKE 1 TABLET BY MOUTH ONCE DAILY   loratadine 10 MG tablet Commonly known as:  CLARITIN Take 10 mg by mouth daily.   multivitamin capsule Take 1 capsule by mouth daily.   ofloxacin 0.3 % ophthalmic solution Commonly known as:  OCUFLOX Place 1 drop into the right eye 3 (three) times daily.   omeprazole 40 MG capsule Commonly known as:  PRILOSEC TAKE 1 CAPSULE BY MOUTH EVERY DAY What changed:    how much to take  how to take this  when to take this   SLOW RELEASE  IRON 45 MG Tbcr Generic drug:  Ferrous Sulfate Dried Take 45 mg by mouth at bedtime.   venlafaxine XR 75 MG 24 hr capsule Commonly known as:  EFFEXOR-XR TAKE 1 CAPSULE BY MOUTH  DAILY What changed:    how much to take  how to take this  when to take this            Durable Medical Equipment  (From admission, onward)        Start     Ordered   07/13/17 1332  DME Walker rolling  Once    Question:  Patient needs a  walker to treat with the following condition  Answer:  Status post total knee replacement, left   07/13/17 1331   07/13/17 1332  DME 3 n 1  Once     07/13/17 1331   07/13/17 1332  DME Bedside commode  Once    Question:  Patient needs a bedside commode to treat with the following condition  Answer:  Status post total knee replacement, left   07/13/17 1331      Diagnostic Studies: Dg Knee 1-2 Views Left  Result Date: 07/13/2017 CLINICAL DATA:  61 year old female status post total knee replacement. EXAM: LEFT KNEE - 1-2 VIEW COMPARISON:  None. FINDINGS: Portable AP and cross-table lateral views of the left knee. Anterior skin staples in place. Left total knee hardware appears intact and normally aligned. Postoperative air in fluid level in the joint space. Mild postoperative subcutaneous gas elsewhere. No unexpected osseous changes. 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 Electronically Signed   By: Genevie Ann M.D.   On: 07/13/2017 12:32    Disposition: 06-Home-Health Care Svc    Follow-up Information    Hessie Knows, MD Follow up in 2 week(s).   Specialty:  Orthopedic Surgery Why:  For staple removal Contact information: [place] [id number] [phone]            Signed: Feliberto Gottron 07/15/2017, 12:36 PM

## 2017-07-15 NOTE — Progress Notes (Signed)
Physical Therapy Treatment Patient Details Name: Maureen [name] MRN: [id number] DOB: 19-Apr-1956 Today's Date: 07/15/2017    History of Present Illness Pt. is a 61 y.o. female who was admitted to [facility] for a left TKR secondary to Osteoarthritis of the left knee. Pt. had a right TKR in 01/2016.    PT Comments    Pt was able to progress walking distance to 200 ft today as well as adding therapeutic exercises to treatment.  Pt reports 8/10 pain and that the pain now feels like it is burning and stinging.  Pt is able to perform transfers mod. I. With some VC's for body mechanics.  PT educated pt concerning the importance of early intervention and the timeline of the healing process following TKA.  Pt will continue to benefit from skilled PT with focus on strength, ROM, functional mobility and pain management.   Follow Up Recommendations  Home health PT     Equipment Recommendations       Recommendations for Other Services       Precautions / Restrictions Precautions Precautions: Knee;Fall Restrictions Weight Bearing Restrictions: Yes LLE Weight Bearing: Weight bearing as tolerated    Mobility  Bed Mobility Overal bed mobility: Modified Independent             General bed mobility comments: Pt requires assistance to bring L LE over EOB but is able to perform all other bed mobility independently, including single leg bridging when needed.  Transfers Overall transfer level: Modified independent Equipment used: Rolling walker (2 wheeled)             General transfer comment: Pt required 2 attempts to stand and VC's for hand placement when initiating STS.  She is hesitant to flex L knee during transfer.  Ambulation/Gait Ambulation/Gait assistance: Modified independent (Device/Increase time) Ambulation Distance (Feet): 200 Feet Assistive device: Rolling walker (2 wheeled) Gait Pattern/deviations: Step-through pattern;Decreased stance time - left;Decreased weight shift to  left   Gait velocity interpretation: at or above normal speed for age/gender General Gait Details: Pt maintains a slow, antalgic gait pattern with decreased stance time on L LE and low foot clearance.  Pt was able to maintain a more steady reciprocal gait pattern following 50-100 ft of ambulation.   Stairs            Wheelchair Mobility    Modified Rankin (Stroke Patients Only)       Balance Overall balance assessment: Modified Independent                                          Cognition Arousal/Alertness: Awake/alert Behavior During Therapy: WFL for tasks assessed/performed Overall Cognitive Status: Within Functional Limits for tasks assessed                                        Exercises Total Joint Exercises Quad Sets: Strengthening;Left;20 reps;Supine Gluteal Sets: Strengthening;Both;10 reps;Supine Short Arc Quad: Strengthening;Left;5 reps Heel Slides: AAROM;20 reps;Left;Supine Hip ABduction/ADduction: Strengthening;Left;10 reps;Supine    General Comments        Pertinent Vitals/Pain Pain Assessment: 0-10 Pain Score: 8  Pain Location: Reports an 8/10 with activity and states that her pain has changed today.  She feels a stinging pain at the inferior portion of incision. Pain Descriptors / Indicators: Hervey Ard;Other (Comment)(Stinging/burning) Pain  Intervention(s): Limited activity within patient's tolerance;Monitored during session    Home Living                      Prior Function            PT Goals (current goals can now be found in the care plan section) Acute Rehab PT Goals Patient Stated Goal: To return home and complete physical therapy. PT Goal Formulation: With patient Time For Goal Achievement: 07/29/17 Potential to Achieve Goals: Good Progress towards PT goals: Progressing toward goals    Frequency    BID      PT Plan Current plan remains appropriate    Co-evaluation               AM-PAC PT "6 Clicks" Daily Activity  Outcome Measure  Difficulty turning over in bed (including adjusting bedclothes, sheets and blankets)?: A Little Difficulty moving from lying on back to sitting on the side of the bed? : A Little Difficulty sitting down on and standing up from a chair with arms (e.g., wheelchair, bedside commode, etc,.)?: A Little Help needed moving to and from a bed to chair (including a wheelchair)?: A Little Help needed walking in hospital room?: A Little Help needed climbing 3-5 steps with a railing? : A Lot 6 Click Score: 17    End of Session Equipment Utilized During Treatment: Gait belt Activity Tolerance: Patient tolerated treatment well Patient left: in bed;with call bell/phone within reach;with bed alarm set   PT Visit Diagnosis: Unsteadiness on feet (R26.81);Other abnormalities of gait and mobility (R26.89);Muscle weakness (generalized) (M62.81);Pain Pain - Right/Left: Left Pain - part of body: Knee     Time: 1045-1110 PT Time Calculation (min) (ACUTE ONLY): 25 min  Charges:  $Gait Training: 8-22 mins $Therapeutic Exercise: 8-22 mins                    G Codes:  Functional Assessment Tool Used: AM-PAC 6 Clicks Basic Mobility Functional Limitation: Mobility: Walking and moving around    Roxanne Gates, PT, DPT   Roxanne Gates 07/15/2017, 12:01 PM

## 2017-07-16 NOTE — Care Management (Signed)
RNCM has notified [facility] of discharge to home today. No other RNCM needs.

## 2017-07-16 NOTE — Progress Notes (Signed)
Wound Vac changed over to disposable for discharge home. Patient given 2 honeycomb dressings as well. Discharge instructions reviewed with patient and spouse with patient's permission. Patient verbalized understanding. Rx given to patient for Lovenox and pain medication. Patient given sedative drug warning and reminded not operative heavy machinery while taking this medication. Patient also reminded to bowel regimen and take stool softeners as needed as narcotic can produce constipation. Patient left with belongings including polar care and bone foam; escorted to vehicle via wheelchair by NT- spouse to drive patient home.

## 2017-07-16 NOTE — Progress Notes (Signed)
Physical Therapy Treatment Patient Details Name: Maureen [name] MRN: [id number] DOB: [birth year] Today's Date: 07/16/2017    History of Present Illness Pt. is a 61 y.o. female who was admitted to [facility] for a left TKR secondary to Osteoarthritis of the left knee. Pt. had a right TKR in 01/2016.    PT Comments    Maureen [name] with mobility.  She ambulated 200 ft with RW.  Her L knee flexion AAROM is limited to 81 and pt was encouraged to continue focusing on this at d/c.   Follow Up Recommendations  Home health PT     Equipment Recommendations       Recommendations for Other Services       Precautions / Restrictions Precautions Precautions: Fall;Knee Precaution Booklet Issued: No Restrictions Weight Bearing Restrictions: Yes LLE Weight Bearing: Weight bearing as tolerated    Mobility  Bed Mobility Overal bed mobility: Modified Independent             General bed mobility comments: Increased time but no physical assist or cues  Transfers Overall transfer level: Modified independent Equipment used: Rolling walker (2 wheeled)             General transfer comment: Cues for proper hand placement and safe technique.  No signs of instability.    Ambulation/Gait Ambulation/Gait assistance: Modified independent (Device/Increase time) Ambulation Distance (Feet): 200 Feet Assistive device: Rolling walker (2 wheeled) Gait Pattern/deviations: Step-through pattern;Decreased stance time - left;Decreased weight shift to left Gait velocity: decreased Gait velocity interpretation: Below normal speed for age/gender General Gait Details: Pt demonstrates step through gait pattern without cues.  Cues provided for forward gaze.    Stairs            Wheelchair Mobility    Modified Rankin (Stroke Patients Only)       Balance Overall balance assessment: Needs assistance Sitting-balance support: No upper extremity supported;Feet  supported Sitting balance-Leahy Scale: Good     Standing balance support: No upper extremity supported;During functional activity Standing balance-Leahy Scale: Fair Standing balance comment: Pt able to stand statically without UE support but relies on UE support for dynamic activities                            Cognition Arousal/Alertness: Awake/alert Behavior During Therapy: WFL for tasks assessed/performed Overall Cognitive Status: Within Functional Limits for tasks assessed                                        Exercises Total Joint Exercises Ankle Circles/Pumps: AROM;Both;10 reps;Supine Quad Sets: Strengthening;Both;10 reps;Supine Straight Leg Raises: AAROM;Strengthening;Left;5 reps;Supine Knee Flexion: AAROM;Left;5 reps;Seated;Other (comment)(with 5 second holds) Goniometric ROM: L knee flexion AAROM in sitting 81 deg    General Comments        Pertinent Vitals/Pain Pain Assessment: Faces Faces Pain Scale: Hurts worst Pain Location: L knee Pain Descriptors / Indicators: Aching;Grimacing;Guarding Pain Intervention(s): Limited activity within patient's tolerance;Monitored during session;Repositioned;Ice applied    Home Living                      Prior Function            PT Goals (current goals can now be found in the care plan section) Acute Rehab PT Goals Patient Stated Goal: to go home PT Goal Formulation: With patient  Time For Goal Achievement: 07/29/17 Potential to Achieve Goals: Good [name] towards PT goals: Progressing toward goals    Frequency    BID      PT Plan Current plan remains appropriate    Co-evaluation              AM-PAC PT "6 Clicks" Daily Activity  Outcome Measure  Difficulty turning over in bed (including adjusting bedclothes, sheets and blankets)?: A Little Difficulty moving from lying on back to sitting on the side of the bed? : A Little Difficulty sitting down on and standing up  from a chair with arms (e.g., wheelchair, bedside commode, etc,.)?: A Little Help needed moving to and from a bed to chair (including a wheelchair)?: A Little Help needed walking in hospital room?: A Little Help needed climbing 3-5 steps with a railing? : A Little 6 Click Score: 18    End of Session Equipment Utilized During Treatment: Gait belt Activity Tolerance: Patient tolerated treatment well Patient left: in bed;with call bell/phone within reach;with bed alarm set;with family/visitor present;Other (comment)(with polar care and towel roll) Nurse Communication: Mobility status PT Visit Diagnosis: Other abnormalities of gait and mobility (R26.89);Unsteadiness on feet (R26.81);Muscle weakness (generalized) (M62.81);Pain Pain - Right/Left: Left Pain - part of body: Knee     Time: 9528-4132 PT Time Calculation (min) (ACUTE ONLY): 19 min  Charges:  $Gait Training: 8-22 mins                    G Codes:       Collie Siad PT, DPT 07/16/2017, 1:26 PM

## 2017-07-16 NOTE — Progress Notes (Signed)
   Subjective: 3 Days Post-Op Procedure(s) (LRB): TOTAL KNEE ARTHROPLASTY (Left) Patient reports pain as moderate.  .Patient is well, and has had no acute complaints or problems.  Denies any CP, SOB, ABD pain. We will continue therapy today.  Plan is to go Home after hospital stay.  Objective: Vital signs in last 24 hours: Temp:  [98.5 F (36.9 C)-98.6 F (37 C)] 98.5 F (36.9 C) (11/15 1920) Pulse Rate:  [96-99] 99 (11/15 1920) Resp:  [18] 18 (11/15 1920) BP: (114-152)/(59-81) 114/59 (11/15 1920) SpO2:  [93 %-97 %] 95 % (11/15 1920)  Intake/Output from previous day: 11/15 0701 - 11/16 0700 In: 360 [P.O.:360] Out: -  Intake/Output this shift: No intake/output data recorded.  Recent Labs    07/13/17 1401 07/14/17 0427 07/15/17 0319  HGB 13.0 12.2 11.3*   Recent Labs    07/14/17 0427 07/15/17 0319  WBC 8.4 7.3  RBC 3.61* 3.41*  HCT 35.7 33.6*  PLT 223 203   Recent Labs    07/14/17 0427 07/15/17 0319  NA 132* 136  K 3.8 3.9  CL 99* 101  CO2 24 29  BUN 11 13  CREATININE 0.68 0.68  GLUCOSE 168* 124*  CALCIUM 9.0 8.8*   No results for input(s): LABPT, INR in the last 72 hours.  EXAM General - Patient is Alert, Appropriate and Oriented Extremity - Neurovascular intact Sensation intact distally Intact pulses distally Dorsiflexion/Plantar flexion intact No cellulitis present Compartment soft Dressing - dressing C/D/I and no drainage wound VAC is intact  Motor Function - intact, moving foot and toes well on exam. Ambulated 250 feet including stairs.  Past Medical History:  Diagnosis Date  . Allergy    seasonal  . Arthritis    "knees, back" (02/17/2016)  . GERD (gastroesophageal reflux disease)   . History of hiatal hernia   . Hypertension    HX OF HYPERTENSION PRIOR TO LAP BANDING - WAS TAKEN OFF BP MED  . IBS (irritable bowel syndrome)   . Numbness    LEFT FOOT  . OSA (obstructive sleep apnea)    resolved with Lap Band/2nd test  . PONV  (postoperative nausea and vomiting)    PONV with gallbladder - no PONV since that procedure    Assessment/Plan:   3 Days Post-Op Procedure(s) (LRB): TOTAL KNEE ARTHROPLASTY (Left) Active Problems:   Primary localized osteoarthritis of left knee  Estimated body mass index is 43.84 kg/m as calculated from the following:   Height as of this encounter: 5\' 1"  (1.549 m).   Weight as of this encounter: 105.2 kg (232 lb). Advance diet Up with therapy  Continue with current pain regimen  CM to assist with discharge to home with HHPT. Plan for discharge home today with home health physical therapy  DVT Prophylaxis - Lovenox, Foot Pumps and TED hose Weight-Bearing as tolerated to left leg   Reche Dixon, PA-C [place] 07/16/2017, 6:14 AM

## 2017-07-18 DIAGNOSIS — M1711 Unilateral primary osteoarthritis, right knee: Secondary | ICD-10-CM | POA: Diagnosis not present

## 2017-07-18 DIAGNOSIS — Z471 Aftercare following joint replacement surgery: Secondary | ICD-10-CM | POA: Diagnosis not present

## 2017-07-18 DIAGNOSIS — I10 Essential (primary) hypertension: Secondary | ICD-10-CM | POA: Diagnosis not present

## 2017-07-19 DIAGNOSIS — Z471 Aftercare following joint replacement surgery: Secondary | ICD-10-CM | POA: Diagnosis not present

## 2017-07-19 DIAGNOSIS — M1711 Unilateral primary osteoarthritis, right knee: Secondary | ICD-10-CM | POA: Diagnosis not present

## 2017-07-19 DIAGNOSIS — I10 Essential (primary) hypertension: Secondary | ICD-10-CM | POA: Diagnosis not present

## 2017-07-21 ENCOUNTER — Telehealth: Payer: Self-pay | Admitting: Physician Assistant

## 2017-07-21 MED ORDER — SULFAMETHOXAZOLE-TRIMETHOPRIM 800-160 MG PO TABS: 1.0000 | ORAL_TABLET | Freq: Two times a day (BID) | ORAL | 0 refills | Status: DC

## 2017-07-21 NOTE — Telephone Encounter (Signed)
Patient had knee surgery last week, unable to make OV, having burning, stinging, frequency. Has history of UTI. Will send in bactrim. If worse pain, if fever, chills, AB/back pain unable to urinate or not better go to Er/Urgent care

## 2017-07-23 DIAGNOSIS — I10 Essential (primary) hypertension: Secondary | ICD-10-CM | POA: Diagnosis not present

## 2017-07-23 DIAGNOSIS — Z471 Aftercare following joint replacement surgery: Secondary | ICD-10-CM | POA: Diagnosis not present

## 2017-07-23 DIAGNOSIS — M1711 Unilateral primary osteoarthritis, right knee: Secondary | ICD-10-CM | POA: Diagnosis not present

## 2017-07-26 DIAGNOSIS — I10 Essential (primary) hypertension: Secondary | ICD-10-CM | POA: Diagnosis not present

## 2017-07-26 DIAGNOSIS — M1711 Unilateral primary osteoarthritis, right knee: Secondary | ICD-10-CM | POA: Diagnosis not present

## 2017-07-26 DIAGNOSIS — Z471 Aftercare following joint replacement surgery: Secondary | ICD-10-CM | POA: Diagnosis not present

## 2017-07-27 DIAGNOSIS — M1711 Unilateral primary osteoarthritis, right knee: Secondary | ICD-10-CM | POA: Diagnosis not present

## 2017-07-27 DIAGNOSIS — Z471 Aftercare following joint replacement surgery: Secondary | ICD-10-CM | POA: Diagnosis not present

## 2017-07-27 DIAGNOSIS — I10 Essential (primary) hypertension: Secondary | ICD-10-CM | POA: Diagnosis not present

## 2017-07-28 DIAGNOSIS — Z96652 Presence of left artificial knee joint: Secondary | ICD-10-CM | POA: Diagnosis not present

## 2017-07-29 DIAGNOSIS — Z96652 Presence of left artificial knee joint: Secondary | ICD-10-CM | POA: Diagnosis not present

## 2017-08-02 DIAGNOSIS — Z96652 Presence of left artificial knee joint: Secondary | ICD-10-CM | POA: Diagnosis not present

## 2017-08-04 DIAGNOSIS — Z96652 Presence of left artificial knee joint: Secondary | ICD-10-CM | POA: Diagnosis not present

## 2017-08-06 DIAGNOSIS — Z96652 Presence of left artificial knee joint: Secondary | ICD-10-CM | POA: Diagnosis not present

## 2017-08-09 ENCOUNTER — Other Ambulatory Visit: Payer: Self-pay | Admitting: Physician Assistant

## 2017-08-09 DIAGNOSIS — G8929 Other chronic pain: Secondary | ICD-10-CM

## 2017-08-09 DIAGNOSIS — M545 Low back pain: Principal | ICD-10-CM

## 2017-08-11 DIAGNOSIS — Z96652 Presence of left artificial knee joint: Secondary | ICD-10-CM | POA: Diagnosis not present

## 2017-08-13 DIAGNOSIS — Z96652 Presence of left artificial knee joint: Secondary | ICD-10-CM | POA: Diagnosis not present

## 2017-08-16 DIAGNOSIS — Z96652 Presence of left artificial knee joint: Secondary | ICD-10-CM | POA: Diagnosis not present

## 2017-08-18 DIAGNOSIS — Z96652 Presence of left artificial knee joint: Secondary | ICD-10-CM | POA: Diagnosis not present

## 2017-08-25 DIAGNOSIS — Z96652 Presence of left artificial knee joint: Secondary | ICD-10-CM | POA: Diagnosis not present

## 2017-08-25 DIAGNOSIS — M25662 Stiffness of left knee, not elsewhere classified: Secondary | ICD-10-CM | POA: Diagnosis not present

## 2017-08-27 DIAGNOSIS — M1712 Unilateral primary osteoarthritis, left knee: Secondary | ICD-10-CM | POA: Diagnosis not present

## 2017-08-27 DIAGNOSIS — Z96652 Presence of left artificial knee joint: Secondary | ICD-10-CM | POA: Diagnosis not present

## 2017-08-30 DIAGNOSIS — Z96652 Presence of left artificial knee joint: Secondary | ICD-10-CM | POA: Diagnosis not present

## 2017-09-01 DIAGNOSIS — Z96652 Presence of left artificial knee joint: Secondary | ICD-10-CM | POA: Diagnosis not present

## 2017-09-03 DIAGNOSIS — Z96652 Presence of left artificial knee joint: Secondary | ICD-10-CM | POA: Diagnosis not present

## 2017-09-13 DIAGNOSIS — M25662 Stiffness of left knee, not elsewhere classified: Secondary | ICD-10-CM | POA: Diagnosis not present

## 2017-09-13 DIAGNOSIS — Z96652 Presence of left artificial knee joint: Secondary | ICD-10-CM | POA: Diagnosis not present

## 2017-09-15 DIAGNOSIS — Z96652 Presence of left artificial knee joint: Secondary | ICD-10-CM | POA: Diagnosis not present

## 2017-09-16 ENCOUNTER — Encounter: Payer: Self-pay | Admitting: Physician Assistant

## 2017-09-16 ENCOUNTER — Ambulatory Visit (INDEPENDENT_AMBULATORY_CARE_PROVIDER_SITE_OTHER): Payer: Managed Care, Other (non HMO) | Admitting: Physician Assistant

## 2017-09-16 VITALS — BP 130/78 | HR 77 | Temp 97.6°F | Resp 16 | Ht 61.0 in | Wt 234.6 lb

## 2017-09-16 DIAGNOSIS — M5417 Radiculopathy, lumbosacral region: Secondary | ICD-10-CM | POA: Diagnosis not present

## 2017-09-16 DIAGNOSIS — M9904 Segmental and somatic dysfunction of sacral region: Secondary | ICD-10-CM | POA: Diagnosis not present

## 2017-09-16 DIAGNOSIS — I10 Essential (primary) hypertension: Secondary | ICD-10-CM

## 2017-09-16 DIAGNOSIS — Z79899 Other long term (current) drug therapy: Secondary | ICD-10-CM

## 2017-09-16 DIAGNOSIS — E785 Hyperlipidemia, unspecified: Secondary | ICD-10-CM | POA: Diagnosis not present

## 2017-09-16 DIAGNOSIS — M25562 Pain in left knee: Secondary | ICD-10-CM

## 2017-09-16 DIAGNOSIS — M9903 Segmental and somatic dysfunction of lumbar region: Secondary | ICD-10-CM | POA: Diagnosis not present

## 2017-09-16 NOTE — Patient Instructions (Signed)
Intermittent fasting is more about strategy than starvation. It's meant to reset your body in different ways, hopefully with fitness and nutrition changes as a result.  Like any big switchover, though, results may vary when it comes down to the individual level. What works for your friends may not work for you, or vice versa. That's why it's helpful to play around with variations on intermittent fasting and healthy habits and find what works best for you.  WHAT IS INTERMITTENT FASTING AND WHY DO IT?  Intermittent fasting doesn't involve specific foods, but rather, a strict schedule regarding when you eat. Also called "time-restricted eating," the tactic has been praised for its contribution to weight loss, improved body composition, and decreased cravings. Preliminary research also suggests it may be beneficial for glucose tolerance, hormone regulation, better muscle mass and lower body fat.  Part of its appeal is the simplicity of the effort. Unlike some other trends, there's no calculations to intermittent fasting.  You simply eat within a certain block of time, usually a window of 8-10 hours. In the other big block of time - about 14-16 hours, including when you're asleep - you don't eat anything, not even snacks. You can drink water, coffee, tea or any other beverage that doesn't have calories.  For example, if you like having a late dinner, you might skip breakfast and have your first meal at noon and your last meal of the day at 8 p.m., and then not eat until noon again the next day.  IDEAS FOR GETTING STARTED  If you're new to the strategy, it may be helpful to eat within the typical circadian rhythm and keep eating within daylight hours. This can be especially beneficial if you're looking at intermittent fasting for weight-loss goals.  So first try only eating between 12pm to 8pm.  Outside of this time you may have water, black coffee, and hot tea. You may not eat it drink anything that  has carbs, sugars, OR artificial sugars like diet soda.   Like any major eating and fitness shift, it can take time to find the perfect fit, so don't be afraid to experiment with different options - including ditching intermittent fasting altogether if it's simply not for you. But if it is, you may be surprised by some of the benefits that come along with the strategy.  8 Critical Weight-Loss Tips That Aren't Diet and Exercise  1. STARVE THE DISTRACTIONS  All too often when we eat, we're also multitasking: watching TV, answering emails, scrolling through social media. These habits are detrimental to having a strong, clear, healthy relationship with food, and they can hinder our ability to make dietary changes.  In order to truly focus on what you're eating, how much you're eating, why you're eating those specific foods and, most importantly, how those foods make you feel, you need to starve the distractions. That means when you eat, just eat. Focus on your food, the process it went through to end up on your plate, where it came from and how it nourishes you. With this technique, you're more likely to finish a meal feeling satiated.  2.  CONSIDER WHAT YOU'RE NOT WILLING TO DO  This might sound counterintuitive, but it can help provide a "why" when motivation is waning. Declare, in writing, what you are unwilling to do, for example "I am unwilling to be the old dad who cannot play sports with my children".  So consider what you're not willing to accept, write it down,  and keep it at the ready.  3.  STOP LABELING FOOD "GOOD" AND "BAD"  You've probably heard someone say they ate something "bad." Maybe you've even said it yourself.  The trouble with 'bad' foods isn't that they'll send you to the grave after a bite or two. The trouble comes when we eat excessive portions of really calorie-dense foods meal after meal, day after day.  Instead of labeling foods as good or bad, think about which foods  you can eat a lot of, and which ones you should just eat a little of. Then, plan ways to eat the foods you really like in portions that fit with your overall goals. A good example of this would be having a slice of pizza alongside a club salad with chicken breast, avocado and a bit of dressing. This is vastly different than 3 slices of pizza, 4 breadsticks with cheese sauce and half of a liter of regular soda.  4.  BRUSH YOUR TEETH AFTER YOU EAT  Getting your mindset in order is important, but sometimes small habits can make a big difference. After eating, you still have the taste of food in their mouth, which often causes people to eat more even if they are full or engage in a nibble or two of dessert.  Brushing your teeth will remove the taste of food from your mouth, and the clean, minty freshness will serve as a cue that mealtime is over.  5.  FOCUS ON CROWDING NOT CUTTING  The most common first step during 'dieting' is to cut. We cut our portion sizes down, we cut out 'bad' foods, we cut out entire food groups. This act of cutting puts [place] and our minds into scarcity mode.  When something is off-limits, even if you're able to avoid it for a while, you could end up bingeing on it later because you've gone so long without it. So, instead of cutting, focus on crowding. If you crowd your plate and fill it up with more foods like veggies and protein, it simply allows less room for the other stuff. In other words, shift your focus away from what you can't eat, and celebrate the foods that will help you reach your goals.  6.  TAKE TRACKING A STEP FURTHER  Track what you eat, when you ate it, how much you ate and how that food made you feel. Being completely honest with yourself and writing down every single thing that passes through your lips will help you start to notice that maybe you actually do snack, possibly take in more sugar than you thought, eat when you're bored rather than just hungry or maybe  that you have a habit of snacking before bed while watching TV.  The difference from simply tracking your food intake is you're taking into account how food makes you feel, as well as what you're doing while you're eating. This is about becoming more mindful of what, when and why you eat.  7.  PRIORITIZE GOOD SLEEP  One of the strongest risk factors for being overweight is poor sleep. When you're feeling tired, you're more likely to choose unhealthy comfort foods and to skip your workout. Additionally, sleep deprivation may slow down your metabolism. [employer]! Therefore, sleeping 7-8 hours per night can help with weight loss without having to change your diet or increase your physical activity. And if you feel you snore and still wake up tired, talk with me about sleep apnea.  8.  SET ASIDE TIME  TO DISCONNECT  Just get out there. Disconnect from the electronics and connect to the elements. Not only will this help reduce stress (a major factor in weight gain) by giving your mind a break from the constant stimulation we've all become so accustomed to, but it may also reprogram your brain to connect with yourself and what you're feeling.  SMART goals Having a solid fitness goal is an amazing way to power you towards success, but not all goals are created equal. While it's great to have an end-game in mind, there are some best practices when it comes to goal setting. Whether you want to lose weight, improve your fitness level, or train for an event, putting the SMART method into action can help you achieve what you set out to do.  SMART stands for specific, measurable, attainable, relevant, and timely-all of which are important in reaching a fitness objective. SMART goals can help keep you on track and remind you of your priorities, so you're able to follow through with every workout or healthy meal you have planned.   Get SMART and put these five elements into action when you're setting your fitness  goal.  1. Specific  You need something that's not too arbitrary. For example, a bad goal would be, say, 'get healthy'. A specific goal would be to lose weight. You'll narrow down that goal even further by using the rest of the method, but whether you want to get stronger, faster, or smaller, having a baseline points you in the right direction.  2. Measurable  Here's where you determine exactly how you'll measure your goal. If you're going to follow the bad goal, it would be get really healthy.That's not quantifiable. A measurable goal would be, say, 'lose 10 pounds'. You can quantify your progress, and you can sort of back into a time frame once you have that. Your goal may be to master a pull-up, run five miles, or go to the gym four days a week-whatever it is, you should have a definite way of knowing when you've reached your goal.  3. Attainable  While it can be helpful to set big-picture goals in the long-term, you need a more achievable goal on the horizon to keep you on track. You want to start small and see early wins, which encourages long-term consistency.  If you set something too lofty right off the bat, it might be discouraging to not make progress as fast as you would like. You should also consider the size of your goal-for example, a goal of losing 30 pounds in one month just isn't going to happen, so you're better off setting smaller goals that are in closer reach.  4. Relevant  This is where things get a little tricky, finding your "why" is easier said than done. Ask yourself, 'is this goal worthwhile, and am I motivated to do it?' Creating a goal with some type of motivation attached to it, like I want to lose 10 pounds in two months to be ready for my wedding, can give a bit of relevancy to your goal. Whether you want to feel confident at a big event or perform better during everyday activities, pinpoint why a goal is important to you.  5. Timely  You want to be strict about a  deadline-doing so creates urgency. It's also important not to set your sights too far out. If you give yourself four months to lose 10 pounds, that might be too long because you aren't incentivized to  start working at it immediately. Instead, consider setting smaller goals along the way, like "I want to lose three pounds in two weeks." Maybe running a marathon is your long-term goal, but if you've never been a runner, signing up for one that's a month away isn't realistic-instead, set smaller mileage goals for shorter time periods and work your way up.  You should also be honest with yourself about what you're able to accomplish in a given time frame. You just need to adjust your expectations so they're in line with your schedule and commitments.  Once you have your goal in place, it's all about the follow-through. Whether you want to lose one pound a week, be able to do five full push-ups in two weeks, or run a 5K in under 30 minutes in four weeks, you can come up with a plan to help get your where you want to go-but it all starts with deciding what you want. Be accountable to yourself, stay consistent, and the results will follow.  So try the exercise at home and at your next visit come in with your SMART goal and we can discuss how together we can achieve this goal!  Are you an emotional eater? Do you eat more when you're feeling stressed? Do you eat when you're not hungry or when you're full? Do you eat to feel better (to calm and soothe yourself when you're sad, mad, bored, anxious, etc.)? Do you reward yourself with food? Do you regularly eat until you've stuffed yourself? Does food make you feel safe? Do you feel like food is a friend? Do you feel powerless or out of control around food?  If you answered yes to some of these questions than it is likely that you are an emotional eater. This is normally a learned behavior and can take time to first recognize the signs and second BREAK THE  HABIT.

## 2017-09-16 NOTE — Progress Notes (Signed)
Assessment and Plan:   Hypertension -Continue medication, monitor blood pressure at home. Continue DASH diet.  Reminder to go to the ER if any CP, SOB, nausea, dizziness, severe HA, changes vision/speech, left arm numbness and tingling and jaw pain.   Cholesterol -Continue diet and exercise. Check cholesterol.   Prediabetes  -Continue diet and exercise. Check A1C   Vitamin D Def - check level and continue medications.    Obesity with co morbidities - long discussion about weight loss, diet, and exercise  Acute pain of left knee -     Ambulatory referral to Orthopedic Surgery - has done PT, and has some knee pain but walks with antalgic gait due to valgus deformity of left knee, wants second opinion.  - weight loss emphasized with patient.    Continue diet and meds as discussed. Further disposition pending results of labs Over 30 minutes of exam, counseling, chart review, and critical decision making was performed [facility] labs done Future Appointments  Date Time Provider [place]  09/16/2017  4:30 PM [name], PA-C [facility] None  02/02/2018  3:00 PM [name], PA-C [facility] None     HPI 62 y.o. female  presents for 3 month follow up on hypertension, cholesterol, prediabetes, and vitamin D deficiency.   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 Some soreness at the knee but now pain.  She has been in PT that is not help and seeking a second orthopedic surgeon opinion. She has joined a weight loss program and is working diligently on weight loss.    Her blood pressure has been controlled at home, today their BP is BP: 130/78  She does workout. She denies chest pain, shortness of breath, dizziness.  She is not on cholesterol medication and denies myalgias. Her cholesterol is at goal. The cholesterol last visit was:   Lab  Results  Component Value Date   CHOL 192 05/26/2017   HDL 62 05/26/2017   LDLCALC 92 05/26/2017   TRIG 192 (H) 05/26/2017    She has been working on diet and exercise for prediabetes, and denies paresthesia of the feet, polydipsia, polyuria and visual disturbances. Last A1C in the office was:  Lab Results  Component Value Date   HGBA1C 5.4 01/29/2017   Patient is on Vitamin D supplement.   Lab Results  Component Value Date   VD25OH 44.2 01/29/2017     BMI is Body mass index is 44.33 kg/m., she is working on diet and exercise. Patient has had lap band repair due to GERD with Dr. Hassell Done in May. Has started nutrisystem.  Wt Readings from Last 3 Encounters:  09/16/17 234 lb 9.6 oz (106.4 kg)  07/13/17 232 lb (105.2 kg)  06/30/17 233 lb (105.7 kg)    Current Medications:  Current Outpatient Medications on File Prior to Visit  Medication Sig Dispense Refill  . acetaminophen (TYLENOL) 500 MG tablet Take 1,000 mg by mouth every 6 (six) hours as needed for moderate pain or headache.    . diclofenac (VOLTAREN) 75 MG EC tablet TAKE ONE TABLET TWICE DAILY 60 tablet 0  . Ferrous Sulfate Dried (SLOW RELEASE IRON) 45 MG TBCR Take 45 mg by mouth at bedtime.    . hydrochlorothiazide (HYDRODIURIL) 12.5 MG tablet TAKE 1 TABLET BY MOUTH ONCE DAILY 90 tablet 1  . lisinopril (PRINIVIL,ZESTRIL)  40 MG tablet TAKE 1 TABLET BY MOUTH ONCE DAILY 90 tablet 1  . loratadine (CLARITIN) 10 MG tablet Take 10 mg by mouth daily.     . Multiple Vitamin (MULTIVITAMIN) capsule Take 1 capsule by mouth daily.     Marland Kitchen omeprazole (PRILOSEC) 40 MG capsule TAKE 1 CAPSULE BY MOUTH EVERY DAY 30 capsule 1  . venlafaxine XR (EFFEXOR-XR) 75 MG 24 hr capsule TAKE 1 CAPSULE BY MOUTH  DAILY (Patient taking differently: TAKE 75 MG BY MOUTH DAILY AT BEDTIME) 90 capsule 1   No current facility-administered medications on file prior to visit.    Medical History:  Past Medical History:  Diagnosis Date  . Allergy    seasonal  .  Arthritis    "knees, back" (02/17/2016)  . GERD (gastroesophageal reflux disease)   . History of hiatal hernia   . Hypertension    HX OF HYPERTENSION PRIOR TO LAP BANDING - WAS TAKEN OFF BP MED  . IBS (irritable bowel syndrome)   . Numbness    LEFT FOOT  . OSA (obstructive sleep apnea)    resolved with Lap Band/2nd test  . PONV (postoperative nausea and vomiting)    PONV with gallbladder - no PONV since that procedure   Allergies:  Allergies  Allergen Reactions  . Mucinex [Guaifenesin Er] Other (See Comments)    "feels weird and dizzy"  . Tramadol Itching     Review of Systems:  Review of Systems  Constitutional: Negative.  Negative for chills and fever.  HENT: Negative.   Eyes: Negative.   Respiratory: Negative.   Cardiovascular: Negative.   Gastrointestinal: Negative.   Genitourinary: Negative.  Negative for frequency, hematuria and urgency.  Musculoskeletal: Positive for back pain. Negative for falls, joint pain, myalgias and neck pain.  Skin: Negative.   Neurological: Positive for tingling and sensory change. Negative for dizziness, tremors, speech change, focal weakness, seizures and loss of consciousness.  Endo/Heme/Allergies: Negative.   Psychiatric/Behavioral: Negative.     Family history- Review and unchanged Social history- Review and unchanged Physical Exam: BP 130/78   Pulse 77   Temp 97.6 F (36.4 C)   Resp 16   Ht 5\' 1"  (1.549 m)   Wt 234 lb 9.6 oz (106.4 kg)   SpO2 99%   BMI 44.33 kg/m  Wt Readings from Last 3 Encounters:  09/16/17 234 lb 9.6 oz (106.4 kg)  07/13/17 232 lb (105.2 kg)  06/30/17 233 lb (105.7 kg)   General Appearance: Well nourished, in no apparent distress. Eyes: PERRLA, EOMs, conjunctiva no swelling or erythema Sinuses: No Frontal/maxillary tenderness ENT/Mouth: Ext aud canals clear, TMs without erythema, bulging. No erythema, swelling, or exudate on post pharynx.  Tonsils not swollen or erythematous. Hearing normal.  Neck:  Supple, thyroid normal.  Respiratory: Respiratory effort normal, BS equal bilaterally without rales, rhonchi, wheezing or stridor.  Cardio: RRR with no MRGs. Brisk peripheral pulses without edema.  Abdomen: Soft, obese, + BS,  Non tender, no guarding, rebound, hernias, masses. Lymphatics: Non tender without lymphadenopathy.  Musculoskeletal: Full ROM, 5/5 strength. Left knee with some valgus deformity, well healing scar, no effusion or warmth, no laxity noted. .   Skin: Warm, dry without rashes, lesions, ecchymosis.  Neuro: Cranial nerves intact. Normal muscle tone, no cerebellar symptoms. Psych: Awake and oriented X 3, normal affect, Insight and Judgment appropriate.    [name], PA-C 4:11 PM [facility]

## 2017-09-17 DIAGNOSIS — Z96652 Presence of left artificial knee joint: Secondary | ICD-10-CM | POA: Diagnosis not present

## 2017-10-04 ENCOUNTER — Other Ambulatory Visit: Payer: Self-pay | Admitting: Internal Medicine

## 2017-10-15 ENCOUNTER — Other Ambulatory Visit: Payer: Self-pay | Admitting: Internal Medicine

## 2017-10-15 ENCOUNTER — Other Ambulatory Visit: Payer: Self-pay | Admitting: Physician Assistant

## 2017-10-15 DIAGNOSIS — M545 Low back pain, unspecified: Secondary | ICD-10-CM

## 2017-10-15 DIAGNOSIS — G8929 Other chronic pain: Secondary | ICD-10-CM

## 2017-10-16 LAB — CBC WITH DIFFERENTIAL/PLATELET
BASOS: 0 %
Basophils Absolute: 0 10*3/uL (ref 0.0–0.2)
EOS (ABSOLUTE): 0.2 10*3/uL (ref 0.0–0.4)
Eos: 3 %
Hematocrit: 41.6 % (ref 34.0–46.6)
Hemoglobin: 13.7 g/dL (ref 11.1–15.9)
Immature Grans (Abs): 0 10*3/uL (ref 0.0–0.1)
Immature Granulocytes: 0 %
Lymphocytes Absolute: 2.2 10*3/uL (ref 0.7–3.1)
Lymphs: 35 %
MCH: 32.3 pg (ref 26.6–33.0)
MCHC: 32.9 g/dL (ref 31.5–35.7)
MCV: 98 fL — AB (ref 79–97)
MONOS ABS: 0.4 10*3/uL (ref 0.1–0.9)
Monocytes: 6 %
NEUTROS ABS: 3.4 10*3/uL (ref 1.4–7.0)
Neutrophils: 56 %
PLATELETS: 280 10*3/uL (ref 150–379)
RBC: 4.24 x10E6/uL (ref 3.77–5.28)
RDW: 13.2 % (ref 12.3–15.4)
WBC: 6.2 10*3/uL (ref 3.4–10.8)

## 2017-10-16 LAB — HEPATIC FUNCTION PANEL
ALK PHOS: 110 IU/L (ref 39–117)
ALT: 32 IU/L (ref 0–32)
AST: 20 IU/L (ref 0–40)
Albumin: 4.6 g/dL (ref 3.6–4.8)
Bilirubin Total: 0.2 mg/dL (ref 0.0–1.2)
Bilirubin, Direct: 0.1 mg/dL (ref 0.00–0.40)
TOTAL PROTEIN: 7 g/dL (ref 6.0–8.5)

## 2017-10-16 LAB — LIPID PANEL W/O CHOL/HDL RATIO
Cholesterol, Total: 192 mg/dL (ref 100–199)
HDL: 56 mg/dL (ref >39)
LDL Calculated: 75 mg/dL (ref 0–99)
TRIGLYCERIDES: 306 mg/dL — AB (ref 0–149)
VLDL Cholesterol Cal: 61 mg/dL — ABNORMAL HIGH (ref 5–40)

## 2017-10-16 LAB — BASIC METABOLIC PANEL
BUN/Creatinine Ratio: 29 — ABNORMAL HIGH (ref 12–28)
BUN: 19 mg/dL (ref 8–27)
CALCIUM: 10.1 mg/dL (ref 8.7–10.3)
CO2: 27 mmol/L (ref 20–29)
CREATININE: 0.66 mg/dL (ref 0.57–1.00)
Chloride: 97 mmol/L (ref 96–106)
GFR calc Af Amer: 109 mL/min/{1.73_m2} (ref >59)
GFR calc non Af Amer: 95 mL/min/{1.73_m2} (ref >59)
Glucose: 88 mg/dL (ref 65–99)
POTASSIUM: 4.9 mmol/L (ref 3.5–5.2)
Sodium: 139 mmol/L (ref 134–144)

## 2017-10-16 LAB — TSH: TSH: 2.85 u[IU]/mL (ref 0.450–4.500)

## 2017-10-29 DIAGNOSIS — M1712 Unilateral primary osteoarthritis, left knee: Secondary | ICD-10-CM | POA: Diagnosis not present

## 2017-10-29 DIAGNOSIS — M25562 Pain in left knee: Secondary | ICD-10-CM | POA: Diagnosis not present

## 2017-10-29 DIAGNOSIS — Z96652 Presence of left artificial knee joint: Secondary | ICD-10-CM | POA: Diagnosis not present

## 2017-11-06 ENCOUNTER — Other Ambulatory Visit: Payer: Self-pay | Admitting: Internal Medicine

## 2017-11-27 ENCOUNTER — Other Ambulatory Visit: Payer: Self-pay | Admitting: Physician Assistant

## 2017-12-07 ENCOUNTER — Other Ambulatory Visit: Payer: Self-pay | Admitting: Physician Assistant

## 2017-12-07 ENCOUNTER — Telehealth: Payer: Self-pay | Admitting: Physician Assistant

## 2017-12-07 MED ORDER — HYOSCYAMINE SULFATE 0.125 MG PO TABS: 0.1250 mg | ORAL_TABLET | ORAL | 2 refills | Status: DC | PRN

## 2017-12-07 NOTE — Telephone Encounter (Signed)
Patient calling with diarrhea, headaches and body aches x Sunday, imodium not helping.  Will send in levsin, increase fluids, half gatorade/half water best rehydration for adults. If ANY worsening symptoms OR not better, any fever, chills, AB pain, blood in stool or vomit, unable to keep down water/food please make an OV here or go to the ER.

## 2017-12-07 NOTE — Telephone Encounter (Signed)
LVM to inform pt of message: see previous note.

## 2017-12-08 ENCOUNTER — Encounter: Payer: Self-pay | Admitting: Emergency Medicine

## 2017-12-08 ENCOUNTER — Other Ambulatory Visit: Payer: Self-pay

## 2017-12-08 ENCOUNTER — Emergency Department
Admission: EM | Admit: 2017-12-08 | Discharge: 2017-12-08 | Disposition: A | Discharge status: Home / Self Care | Payer: Managed Care, Other (non HMO) | Attending: Emergency Medicine | Admitting: Emergency Medicine

## 2017-12-08 DIAGNOSIS — Z79899 Other long term (current) drug therapy: Secondary | ICD-10-CM | POA: Insufficient documentation

## 2017-12-08 DIAGNOSIS — A0832 Astrovirus enteritis: Secondary | ICD-10-CM

## 2017-12-08 DIAGNOSIS — Z9049 Acquired absence of other specified parts of digestive tract: Secondary | ICD-10-CM | POA: Diagnosis not present

## 2017-12-08 DIAGNOSIS — Z9884 Bariatric surgery status: Secondary | ICD-10-CM | POA: Diagnosis not present

## 2017-12-08 DIAGNOSIS — R197 Diarrhea, unspecified: Secondary | ICD-10-CM | POA: Diagnosis present

## 2017-12-08 DIAGNOSIS — I10 Essential (primary) hypertension: Secondary | ICD-10-CM | POA: Diagnosis not present

## 2017-12-08 DIAGNOSIS — Z87891 Personal history of nicotine dependence: Secondary | ICD-10-CM | POA: Diagnosis not present

## 2017-12-08 DIAGNOSIS — J45909 Unspecified asthma, uncomplicated: Secondary | ICD-10-CM | POA: Insufficient documentation

## 2017-12-08 DIAGNOSIS — Z96653 Presence of artificial knee joint, bilateral: Secondary | ICD-10-CM | POA: Diagnosis not present

## 2017-12-08 LAB — GASTROINTESTINAL PANEL BY PCR, STOOL (REPLACES STOOL CULTURE)
Adenovirus F40/41: NOT DETECTED
Astrovirus: DETECTED — AB
Campylobacter species: NOT DETECTED
Cryptosporidium: NOT DETECTED
Cyclospora cayetanensis: NOT DETECTED
ENTEROAGGREGATIVE E COLI (EAEC): NOT DETECTED
Entamoeba histolytica: NOT DETECTED
Enteropathogenic E coli (EPEC): NOT DETECTED
Enterotoxigenic E coli (ETEC): NOT DETECTED
GIARDIA LAMBLIA: NOT DETECTED
NOROVIRUS GI/GII: NOT DETECTED
Plesimonas shigelloides: NOT DETECTED
ROTAVIRUS A: NOT DETECTED
Salmonella species: NOT DETECTED
Sapovirus (I, II, IV, and V): NOT DETECTED
Shiga like toxin producing E coli (STEC): NOT DETECTED
Shigella/Enteroinvasive E coli (EIEC): NOT DETECTED
VIBRIO CHOLERAE: NOT DETECTED
Vibrio species: NOT DETECTED
Yersinia enterocolitica: NOT DETECTED

## 2017-12-08 LAB — COMPREHENSIVE METABOLIC PANEL
ALBUMIN: 4 g/dL (ref 3.5–5.0)
ALK PHOS: 88 U/L (ref 38–126)
ALT: 44 U/L (ref 14–54)
AST: 40 U/L (ref 15–41)
Anion gap: 11 (ref 5–15)
BUN: 24 mg/dL — ABNORMAL HIGH (ref 6–20)
CALCIUM: 9.2 mg/dL (ref 8.9–10.3)
CO2: 20 mmol/L — AB (ref 22–32)
Chloride: 104 mmol/L (ref 101–111)
Creatinine, Ser: 0.84 mg/dL (ref 0.44–1.00)
GFR calc Af Amer: >60 mL/min (ref >60)
GFR calc non Af Amer: >60 mL/min (ref >60)
GLUCOSE: 130 mg/dL — AB (ref 65–99)
Potassium: 3.4 mmol/L — ABNORMAL LOW (ref 3.5–5.1)
SODIUM: 135 mmol/L (ref 135–145)
Total Bilirubin: 0.8 mg/dL (ref 0.3–1.2)
Total Protein: 7.5 g/dL (ref 6.5–8.1)

## 2017-12-08 LAB — URINALYSIS, COMPLETE (UACMP) WITH MICROSCOPIC
BACTERIA UA: NONE SEEN
Bilirubin Urine: NEGATIVE
Glucose, UA: NEGATIVE mg/dL
Hgb urine dipstick: NEGATIVE
KETONES UR: 80 mg/dL — AB
Leukocytes, UA: NEGATIVE
Nitrite: NEGATIVE
Protein, ur: 30 mg/dL — AB
Specific Gravity, Urine: 1.026 (ref 1.005–1.030)
pH: 5 (ref 5.0–8.0)

## 2017-12-08 LAB — CBC
HEMATOCRIT: 46.7 % (ref 35.0–47.0)
HEMOGLOBIN: 15.8 g/dL (ref 12.0–16.0)
MCH: 32.4 pg (ref 26.0–34.0)
MCHC: 33.9 g/dL (ref 32.0–36.0)
MCV: 95.6 fL (ref 80.0–100.0)
Platelets: 254 10*3/uL (ref 150–440)
RBC: 4.88 MIL/uL (ref 3.80–5.20)
RDW: 13.2 % (ref 11.5–14.5)
WBC: 4.6 10*3/uL (ref 3.6–11.0)

## 2017-12-08 LAB — LIPASE, BLOOD: Lipase: 36 U/L (ref 11–51)

## 2017-12-08 LAB — C DIFFICILE QUICK SCREEN W PCR REFLEX
C DIFFICILE (CDIFF) INTERP: NOT DETECTED
C DIFFICILE (CDIFF) TOXIN: NEGATIVE
C DIFFICLE (CDIFF) ANTIGEN: NEGATIVE

## 2017-12-08 MED ORDER — DIPHENOXYLATE-ATROPINE 2.5-0.025 MG PO TABS: 1.0000 | ORAL_TABLET | Freq: Four times a day (QID) | ORAL | 0 refills | Status: DC | PRN

## 2017-12-08 MED ORDER — SODIUM CHLORIDE 0.9 % IV BOLUS
1000.0000 mL | Freq: Once | INTRAVENOUS | Status: AC
  Administered 2017-12-08: 1000 mL via INTRAVENOUS

## 2017-12-08 MED ORDER — ONDANSETRON HCL 4 MG/2ML IJ SOLN
4.0000 mg | Freq: Once | INTRAMUSCULAR | Status: AC
  Administered 2017-12-08: 4 mg via INTRAVENOUS
  Filled 2017-12-08: qty 2

## 2017-12-08 NOTE — ED Triage Notes (Signed)
Pt states diarrhea since Sunday with body aches, now just diarrhea with aches, no vomiting or stomach cramping.  Appears in NAD.

## 2017-12-08 NOTE — ED Triage Notes (Signed)
Pt had one loose stool episode during triage.

## 2017-12-08 NOTE — ED Provider Notes (Signed)
[facility] Emergency Department Provider Note  ____________________________________________   First MD Initiated Contact with Patient 12/08/17 [phone]     (approximate)  I have reviewed the triage vital signs and the nursing notes.   HISTORY  Chief Complaint Diarrhea   HPI Maureen [name] is a 62 y.o. female with a history of hypertension who is presenting to the emergency department with 3 days of diarrhea.  She says that the diarrhea as can sometimes be up to every 15 minutes.  There is no blood in the stool.  Says the stool is watery like.  She was exposed to someone with C. difficile about 2 weeks ago.  No other known sick contacts.  No recent hospitalizations or antibiotics.  No out of country travel.  Patient with mild cramping when she says that she is going to move her bowels but otherwise without abdominal pain.  Mild nausea but no vomiting.  Says that her last bowel movement was about 630 this morning.  Has tried Imodium as well as high Cosamin without relief for her symptoms.   Past Medical History:  Diagnosis Date  . Allergy    seasonal  . Arthritis    "knees, back" (02/17/2016)  . GERD (gastroesophageal reflux disease)   . History of hiatal hernia   . Hypertension    HX OF HYPERTENSION PRIOR TO LAP BANDING - WAS TAKEN OFF BP MED  . IBS (irritable bowel syndrome)   . Numbness    LEFT FOOT  . OSA (obstructive sleep apnea)    resolved with Lap Band/2nd test  . PONV (postoperative nausea and vomiting)    PONV with gallbladder - no PONV since that procedure    Patient Active Problem List   Diagnosis Date Noted  . Primary localized osteoarthritis of left knee 07/13/2017  . Primary osteoarthritis of right knee 02/16/2016  . Anemia 12/04/2015  . Female genuine stress incontinence 10/29/2015  . Arthritis, degenerative 10/29/2015  . Allergic rhinitis 10/29/2015  . Prediabetes 12/03/2014  . Hyperlipidemia 12/03/2014  . Morbid obesity ([place])  12/03/2014  . Vitamin D deficiency 12/03/2014  . Medication management 12/03/2014  . IBS (irritable bowel syndrome)   . GERD (gastroesophageal reflux disease)   . Hypertension   . Asthma   . LAP-BAND surgery status 01/17/2013    Past Surgical History:  Procedure Laterality Date  . ABDOMINAL HYSTERECTOMY  2004  . BREAST LUMPECTOMY Left 1972   benign tumor  . CATARACT EXTRACTION W/ INTRAOCULAR LENS  IMPLANT, BILATERAL Bilateral 05/2017  . COLONOSCOPY    . DILATION AND CURETTAGE OF UTERUS  "several"  . ESOPHAGOGASTRODUODENOSCOPY    . HERNIA REPAIR    . JOINT REPLACEMENT    . LAPAROSCOPIC CHOLECYSTECTOMY  2003  . LAPAROSCOPIC GASTRIC BANDING  11/05/2009   Dr. Hassell Done  . LAPAROSCOPIC GASTRIC BANDING WITH HIATAL HERNIA REPAIR N/A 01/22/2015   Procedure: LAPAROSCOPIC TAKEDOWN AND REVISION OF GASTRIC BAND REVISION WITH UPPER ENDOSCOPY;  Surgeon: Johnathan Hausen, MD;  Location: [facility];  Service: General;  Laterality: N/A;  . MOLE REMOVAL  "several"   "most in my upper abdomen"  . ORIF TOE FRACTURE Left    "little toe"  . TOTAL KNEE ARTHROPLASTY Right 02/17/2016  . TOTAL KNEE ARTHROPLASTY Right 02/17/2016   Procedure: TOTAL KNEE ARTHROPLASTY;  Surgeon: Frederik Pear, MD;  Location: [place];  Service: Orthopedics;  Laterality: Right;  . TOTAL KNEE ARTHROPLASTY Left 07/13/2017   Procedure: TOTAL KNEE ARTHROPLASTY;  Surgeon: Hessie Knows, MD;  Location: [facility];  Service: Orthopedics;  Laterality: Left;  . TUBAL LIGATION      Prior to Admission medications   Medication Sig Start Date End Date Taking? Authorizing Provider  diclofenac (VOLTAREN) 75 MG EC tablet TAKE ONE TABLET BY MOUTH TWICE DAILY 10/15/17  Yes Unk Pinto, MD  Ferrous Sulfate Dried (SLOW RELEASE IRON) 45 MG TBCR Take 45 mg by mouth at bedtime.   Yes [provider]  hydrochlorothiazide (HYDRODIURIL) 12.5 MG tablet TAKE 1 TABLET BY MOUTH ONCE DAILY 06/26/17  Yes Unk Pinto, MD  hyoscyamine (LEVSIN) 0.125 MG  tablet Take 1 tablet (0.125 mg total) by mouth every 4 (four) hours as needed for cramping (diarrhea, nausea). 12/07/17  Yes [name], PA-C  lisinopril (PRINIVIL,ZESTRIL) 40 MG tablet TAKE 1 TABLET BY MOUTH ONCE DAILY 07/03/17  Yes Unk Pinto, MD  loratadine (CLARITIN) 10 MG tablet Take 10 mg by mouth daily.    Yes [provider]  Multiple Vitamin (MULTIVITAMIN) capsule Take 1 capsule by mouth daily.    Yes [provider]  omeprazole (PRILOSEC) 40 MG capsule TAKE 1 CAPSULE BY MOUTH EVERY DAY 11/27/17  Yes Unk Pinto, MD  venlafaxine XR (EFFEXOR-XR) 75 MG 24 hr capsule TAKE 1 CAPSULE BY MOUTH  DAILY 11/07/17  Yes Liane Comber, NP  acetaminophen (TYLENOL) 500 MG tablet Take 1,000 mg by mouth every 6 (six) hours as needed for moderate pain or headache.    [provider]    Allergies Mucinex [guaifenesin er] and Tramadol  Family History  Problem Relation Age of Onset  . Hyperlipidemia Father   . Heart disease Father     Social History Social History   Tobacco Use  . Smoking status: Former Smoker    Packs/day: 0.25    Years: 28.00    Pack years: 7.00    Types: Cigarettes    Last attempt to quit: 08/31/1998    Years since quitting: 19.2  . Smokeless tobacco: Never Used  Substance Use Topics  . Alcohol use: No  . Drug use: No    Review of Systems  Constitutional: No fever/chills Eyes: No visual changes. ENT: No sore throat. Cardiovascular: Denies chest pain. Respiratory: Denies shortness of breath. Gastrointestinal: No nausea, no vomiting.  No constipation. Genitourinary: Negative for dysuria. Musculoskeletal: Negative for back pain. Skin: Negative for rash. Neurological: Negative for headaches, focal weakness or numbness.   ____________________________________________   PHYSICAL EXAM:  VITAL SIGNS: ED Triage Vitals  Enc Vitals Group     BP 12/08/17 0715 133/82     Pulse Rate 12/08/17 0719 (!) 110     Resp 12/08/17 0719  16     Temp 12/08/17 0715 97.7 F (36.5 C)     Temp Source 12/08/17 0715 Oral     SpO2 12/08/17 0719 96 %     Weight 12/08/17 0718 230 lb (104.3 kg)     Height 12/08/17 0718 5\' 1"  (1.549 m)     Head Circumference --      Peak Flow --      Pain Score --      Pain Loc --      Pain Edu? --      Excl. in [place]? --     Constitutional: Alert and oriented. Well appearing and in no acute distress. Eyes: Conjunctivae are normal.  Head: Atraumatic. Nose: No congestion/rhinnorhea. Mouth/Throat: Mucous membranes are moist.  Neck: No stridor.   Cardiovascular: Normal rate, regular rhythm. Grossly normal heart sounds.   Respiratory:  Normal respiratory effort.  No retractions. Lungs CTAB. Gastrointestinal: Soft and nontender. No distention.  Musculoskeletal: No lower extremity tenderness nor edema.  No joint effusions. Neurologic:  Normal speech and language. No gross focal neurologic deficits are appreciated. Skin:  Skin is warm, dry and intact. No rash noted. Psychiatric: Mood and affect are normal. Speech and behavior are normal.  ____________________________________________   LABS (all labs ordered are listed, but only abnormal results are displayed)  Labs Reviewed  GASTROINTESTINAL PANEL BY PCR, STOOL (REPLACES STOOL CULTURE) - Abnormal; Notable for the following components:      Result Value   Astrovirus DETECTED (*)    All other components within normal limits  COMPREHENSIVE METABOLIC PANEL - Abnormal; Notable for the following components:   Potassium 3.4 (*)    CO2 20 (*)    Glucose, Bld 130 (*)    BUN 24 (*)    All other components within normal limits  URINALYSIS, COMPLETE (UACMP) WITH MICROSCOPIC - Abnormal; Notable for the following components:   Color, Urine AMBER (*)    APPearance HAZY (*)    Ketones, ur 80 (*)    Protein, ur 30 (*)    Squamous Epithelial / LPF 0-5 (*)    All other components within normal limits  C DIFFICILE QUICK SCREEN W PCR REFLEX  LIPASE, BLOOD   CBC   ____________________________________________  EKG   ____________________________________________  RADIOLOGY   ____________________________________________   PROCEDURES  Procedure(s) performed:   Procedures  Critical Care performed:   ____________________________________________   INITIAL IMPRESSION / ASSESSMENT AND PLAN / ED COURSE  Pertinent labs & imaging results that were available during my care of the patient were reviewed by me and considered in my medical decision making (see chart for details).  DDX: C. difficile, infectious diarrhea, UTI, viral illness As part of my medical decision making, I reviewed the following data within the electronic MEDICAL RECORD NUMBER Notes from prior outpatient and ED visits  ----------------------------------------- 12:25 PM on 12/08/2017 -----------------------------------------  Patient at this time has had several episodes of diarrhea in the emergency department.  Tested positive for astrovirus on her stool panel.  Advised patient that this is a viral illness and she will need to limit her contact with other people make sure to thoroughly wash her hands.  Patient aware that the symptoms may persist for several more days.  She understands to stay hydrated.  She will be given a work note.  I believe that she will be appropriate for home treatment. ____________________________________________   FINAL CLINICAL IMPRESSION(S) / ED DIAGNOSES  Astrovirus.  Diarrhea  NEW MEDICATIONS STARTED DURING THIS VISIT:  New Prescriptions   No medications on file     Note:  This document was prepared using Dragon voice recognition software and may include unintentional dictation errors.     Orbie Pyo, MD 12/08/17 [phone]

## 2017-12-08 NOTE — ED Notes (Signed)

## 2017-12-28 ENCOUNTER — Encounter: Payer: Self-pay | Admitting: Physician Assistant

## 2017-12-28 ENCOUNTER — Ambulatory Visit (INDEPENDENT_AMBULATORY_CARE_PROVIDER_SITE_OTHER): Payer: Managed Care, Other (non HMO) | Admitting: Physician Assistant

## 2017-12-28 VITALS — BP 124/86 | HR 94 | Temp 97.5°F | Resp 18 | Ht 61.0 in | Wt 239.0 lb

## 2017-12-28 DIAGNOSIS — Z79899 Other long term (current) drug therapy: Secondary | ICD-10-CM

## 2017-12-28 DIAGNOSIS — Z9884 Bariatric surgery status: Secondary | ICD-10-CM | POA: Diagnosis not present

## 2017-12-28 DIAGNOSIS — R7303 Prediabetes: Secondary | ICD-10-CM | POA: Diagnosis not present

## 2017-12-28 DIAGNOSIS — I10 Essential (primary) hypertension: Secondary | ICD-10-CM

## 2017-12-28 DIAGNOSIS — E785 Hyperlipidemia, unspecified: Secondary | ICD-10-CM

## 2017-12-28 DIAGNOSIS — D649 Anemia, unspecified: Secondary | ICD-10-CM

## 2017-12-28 NOTE — Progress Notes (Signed)
Assessment and Plan:   Hypertension -Continue medication, monitor blood pressure at home. Continue DASH diet.  Reminder to go to the ER if any CP, SOB, nausea, dizziness, severe HA, changes vision/speech, left arm numbness and tingling and jaw pain.   Cholesterol -Continue diet and exercise. Check cholesterol.   Left knee pain Going to have surgery with Dr. Suezanne Jacquet Patient medically cleared   Vitamin D Def - check level and continue medications.    Obesity with co morbidities - long discussion about weight loss, diet, and exercise   Continue diet and meds as discussed. Further disposition pending results of labs Over 30 minutes of exam, counseling, chart review, and critical decision making was performed LAB corp labs done Future Appointments  Date Time Provider [place]  04/08/2018  4:00 PM [name], MD [facility]  04/21/2018  4:15 PM [name], PA-C [id number] None     HPI 62 y.o. female  presents for 3 month follow up on hypertension, cholesterol, prediabetes, and vitamin D deficiency.    Her blood pressure has been controlled at home, today their BP is BP: 124/86  She does workout. She denies chest pain, shortness of breath, dizziness.  She is not on cholesterol medication and denies myalgias. Her cholesterol is at goal. The cholesterol last visit was:   Lab Results  Component Value Date   CHOL 192 10/15/2017   HDL 56 10/15/2017   LDLCALC 75 10/15/2017   TRIG 306 (H) 10/15/2017    She has been working on diet and exercise for prediabetes, and denies paresthesia of the feet, polydipsia, polyuria and visual disturbances. Last A1C in the office was:  Lab Results  Component Value Date   HGBA1C 5.4 01/29/2017   Patient is on Vitamin D supplement.   Lab Results  Component Value Date   VD25OH 44.2 01/29/2017     BMI is Body mass index is 45.16 kg/m., she is working on diet and exercise. Patient has had lap band repair due to GERD with Dr. Hassell Done  in May.  Wt Readings from Last 3 Encounters:  12/28/17 239 lb (108.4 kg)  12/08/17 230 lb (104.3 kg)  09/16/17 234 lb 9.6 oz (106.4 kg)    Current Medications:  Current Outpatient Medications on File Prior to Visit  Medication Sig Dispense Refill  . acetaminophen (TYLENOL) 500 MG tablet Take 1,000 mg by mouth every 6 (six) hours as needed for moderate pain or headache.    . diclofenac (VOLTAREN) 75 MG EC tablet TAKE ONE TABLET BY MOUTH TWICE DAILY 60 tablet 0  . Ferrous Sulfate Dried (SLOW RELEASE IRON) 45 MG TBCR Take 45 mg by mouth at bedtime.    . hydrochlorothiazide (HYDRODIURIL) 12.5 MG tablet TAKE 1 TABLET BY MOUTH ONCE DAILY 90 tablet 1  . lisinopril (PRINIVIL,ZESTRIL) 40 MG tablet TAKE 1 TABLET BY MOUTH ONCE DAILY 90 tablet 1  . loratadine (CLARITIN) 10 MG tablet Take 10 mg by mouth daily.     . Multiple Vitamin (MULTIVITAMIN) capsule Take 1 capsule by mouth daily.     Marland Kitchen omeprazole (PRILOSEC) 40 MG capsule TAKE 1 CAPSULE BY MOUTH EVERY DAY 30 capsule 2  . venlafaxine XR (EFFEXOR-XR) 75 MG 24 hr capsule TAKE 1 CAPSULE BY MOUTH  DAILY 90 capsule 1   No current facility-administered medications on file prior to visit.    Medical History:  Past Medical History:  Diagnosis Date  . Allergy    seasonal  . Arthritis    "knees, back" (02/17/2016)  .  GERD (gastroesophageal reflux disease)   . History of hiatal hernia   . Hypertension    HX OF HYPERTENSION PRIOR TO LAP BANDING - WAS TAKEN OFF BP MED  . IBS (irritable bowel syndrome)   . Numbness    LEFT FOOT  . OSA (obstructive sleep apnea)    resolved with Lap Band/2nd test  . PONV (postoperative nausea and vomiting)    PONV with gallbladder - no PONV since that procedure   Allergies:  Allergies  Allergen Reactions  . Mucinex [Guaifenesin Er] Other (See Comments)    "feels weird and dizzy"  . Tramadol Itching     Review of Systems:  Review of Systems  Constitutional: Negative.  Negative for chills and fever.  HENT:  Negative.   Eyes: Negative.   Respiratory: Negative.   Cardiovascular: Negative.   Gastrointestinal: Negative.   Genitourinary: Negative.  Negative for frequency, hematuria and urgency.  Musculoskeletal: Positive for back pain. Negative for falls, joint pain, myalgias and neck pain.  Skin: Negative.   Neurological: Positive for tingling and sensory change. Negative for dizziness, tremors, speech change, focal weakness, seizures and loss of consciousness.  Endo/Heme/Allergies: Negative.   Psychiatric/Behavioral: Negative.     Family history- Review and unchanged Social history- Review and unchanged Physical Exam: BP 124/86   Pulse 94   Temp (!) 97.5 F (36.4 C)   Resp 18   Ht 5\' 1"  (1.549 m)   Wt 239 lb (108.4 kg)   SpO2 99%   BMI 45.16 kg/m  Wt Readings from Last 3 Encounters:  12/28/17 239 lb (108.4 kg)  12/08/17 230 lb (104.3 kg)  09/16/17 234 lb 9.6 oz (106.4 kg)   General Appearance: Well nourished, in no apparent distress. Eyes: PERRLA, EOMs, conjunctiva no swelling or erythema Sinuses: No Frontal/maxillary tenderness ENT/Mouth: Ext aud canals clear, TMs without erythema, bulging. No erythema, swelling, or exudate on post pharynx.  Tonsils not swollen or erythematous. Hearing normal.  Neck: Supple, thyroid normal.  Respiratory: Respiratory effort normal, BS equal bilaterally without rales, rhonchi, wheezing or stridor.  Cardio: RRR with no MRGs. Brisk peripheral pulses without edema.  Abdomen: Soft, obese, + BS,  Non tender, no guarding, rebound, hernias, masses. Lymphatics: Non tender without lymphadenopathy.  Musculoskeletal: Full ROM, 5/5 strength. Left knee with some valgus deformity, well healing scar, no effusion or warmth, no laxity noted. .   Skin: Warm, dry without rashes, lesions, ecchymosis.  Neuro: Cranial nerves intact. Normal muscle tone, no cerebellar symptoms. Psych: Awake and oriented X 3, normal affect, Insight and Judgment appropriate.    [name], PA-C 5:00 PM [facility]

## 2017-12-28 NOTE — Patient Instructions (Signed)
Do the prilosec every other day with zantac beween but suggest going back down to 20mg   Add either restora probiotic OR 1-2 tsp apple cider vinegar and see if this helps.   Check out  Mini habits for weight loss book  2 apps for tracking food is myfitness pal  loseit OR can take picture of your food  Are you an emotional eater? Do you eat more when you're feeling stressed? Do you eat when you're not hungry or when you're full? Do you eat to feel better (to calm and soothe yourself when you're sad, mad, bored, anxious, etc.)? Do you reward yourself with food? Do you regularly eat until you've stuffed yourself? Does food make you feel safe? Do you feel like food is a friend? Do you feel powerless or out of control around food?  If you answered yes to some of these questions than it is likely that you are an emotional eater. This is normally a learned behavior and can take time to first recognize the signs and second BREAK THE HABIT. But here is more information and tips to help.   The difference between emotional hunger and physical hunger Emotional hunger can be powerful, so it's easy to mistake it for physical hunger. But there are clues you can look for to help you tell physical and emotional hunger apart.  Emotional hunger comes on suddenly. It hits you in an instant and feels overwhelming and urgent. Physical hunger, on the other hand, comes on more gradually. The urge to eat doesn't feel as dire or demand instant satisfaction (unless you haven't eaten for a very long time).  Emotional hunger craves specific comfort foods. When you're physically hungry, almost anything sounds good-including healthy stuff like vegetables. But emotional hunger craves junk food or sugary snacks that provide an instant rush. You feel like you need cheesecake or pizza, and nothing else will do.  Emotional hunger often leads to mindless eating. Before you know it, you've eaten a whole bag of chips or an  entire pint of ice cream without really paying attention or fully enjoying it. When you're eating in response to physical hunger, you're typically more aware of what you're doing.  Emotional hunger isn't satisfied once you're full. You keep wanting more and more, often eating until you're uncomfortably stuffed. Physical hunger, on the other hand, doesn't need to be stuffed. You feel satisfied when your stomach is full.  Emotional hunger isn't located in the stomach. Rather than a growling belly or a pang in your stomach, you feel your hunger as a craving you can't get out of your head. You're focused on specific textures, tastes, and smells.  Emotional hunger often leads to regret, guilt, or shame. When you eat to satisfy physical hunger, you're unlikely to feel guilty or ashamed because you're simply giving your body what it needs. If you feel guilty after you eat, it's likely because you know deep down that you're not eating for nutritional reasons.  Identify your emotional eating triggers What situations, places, or feelings make you reach for the comfort of food? Most emotional eating is linked to unpleasant feelings, but it can also be triggered by positive emotions, such as rewarding yourself for achieving a goal or celebrating a holiday or happy event. Common causes of emotional eating include:  Stuffing emotions - Eating can be a way to temporarily silence or "stuff down" uncomfortable emotions, including anger, fear, sadness, anxiety, loneliness, resentment, and shame. While you're numbing yourself with food,  you can avoid the difficult emotions you'd rather not feel.  Boredom or feelings of emptiness - Do you ever eat simply to give yourself something to do, to relieve boredom, or as a way to fill a void in your life? You feel unfulfilled and empty, and food is a way to occupy your mouth and your time. In the moment, it fills you up and distracts you from underlying feelings of purposelessness  and dissatisfaction with your life.  Childhood habits - Think back to your childhood memories of food. Did your parents reward good behavior with ice cream, take you out for pizza when you got a good report card, or serve you sweets when you were feeling sad? These habits can often carry over into adulthood. Or your eating may be driven by nostalgia-for cherished memories of grilling burgers in the backyard with your dad or baking and eating cookies with your mom.  Social influences - Getting together with other people for a meal is a great way to relieve stress, but it can also lead to overeating. It's easy to overindulge simply because the food is there or because everyone else is eating. You may also overeat in social situations out of nervousness. Or perhaps your family or circle of friends encourages you to overeat, and it's easier to go along with the group.  Stress - Ever notice how stress makes you hungry? It's not just in your mind. When stress is chronic, as it so often is in our chaotic, fast-paced world, your body produces high levels of the stress hormone, cortisol. Cortisol triggers cravings for salty, sweet, and fried foods-foods that give you a burst of energy and pleasure. The more uncontrolled stress in your life, the more likely you are to turn to food for emotional relief.  Find other ways to feed your feelings If you don't know how to manage your emotions in a way that doesn't involve food, you won't be able to control your eating habits for very long. Diets so often fail because they offer logical nutritional advice which only works if you have conscious control over your eating habits. It doesn't work when emotions hijack the process, demanding an immediate payoff with food.  In order to stop emotional eating, you have to find other ways to fulfill yourself emotionally. It's not enough to understand the cycle of emotional eating or even to understand your triggers, although that's a  huge first step. You need alternatives to food that you can turn to for emotional fulfillment.  Alternatives to emotional eating If you're depressed or lonely, call someone who always makes you feel better, play with your dog or cat, or look at a favorite photo or cherished memento.  If you're anxious, expend your nervous energy by dancing to your favorite song, squeezing a stress ball, or taking a brisk walk.  If you're exhausted, treat yourself with a hot cup of tea, take a bath, light some scented candles, or wrap yourself in a warm blanket.  If you're bored, read a good book, watch a comedy show, explore the outdoors, or turn to an activity you enjoy (woodworking, playing the guitar, shooting hoops, scrapbooking, etc.).  What is mindful eating? Mindful eating is a practice that develops your awareness of eating habits and allows you to pause between your triggers and your actions. Most emotional eaters feel powerless over their food cravings. When the urge to eat hits, you feel an almost unbearable tension that demands to be fed, right now.  Because you've tried to resist in the past and failed, you believe that your willpower just isn't up to snuff. But the truth is that you have more power over your cravings than you think.  Take 5 before you give in to a craving Emotional eating tends to be automatic and virtually mindless. Before you even realize what you're doing, you've reached for a tub of ice cream and polished off half of it. But if you can take a moment to pause and reflect when you're hit with a craving, you give yourself the opportunity to make a different decision.  Can you put off eating for five minutes? Or just start with one minute. Don't tell yourself you can't give in to the craving; remember, the forbidden is extremely tempting. Just tell yourself to wait.  While you're waiting, check in with yourself. How are you feeling? What's going on emotionally? Even if you end up  eating, you'll have a better understanding of why you did it. This can help you set yourself up for a different response next time.  How to practice mindful eating Eating while you're also doing other things-such as watching TV, driving, or playing with your phone-can prevent you from fully enjoying your food. Since your mind is elsewhere, you may not feel satisfied or continue eating even though you're no longer hungry. Eating more mindfully can help focus your mind on your food and the pleasure of a meal and curb overeating.   Eat your meals in a calm place with no distractions, aside from any dining companions.  Try eating with your non-dominant hand or using chopsticks instead of a knife and fork. Eating in such a non-familiar way can slow down how fast you eat and ensure your mind stays focused on your food.  Allow yourself enough time not to have to rush your meal. Set a timer for 20 minutes and pace yourself so you spend at least that much time eating.  Take small bites and chew them well, taking time to notice the different flavors and textures of each mouthful.  Put your utensils down between bites. Take time to consider how you feel-hungry, satiated-before picking up your utensils again.  Try to stop eating before you are full.It takes time for the signal to reach your brain that you've had enough. Don't feel obligated to always clean your plate.  When you've finished your food, take a few moments to assess if you're really still hungry before opting for an extra serving or dessert.  Learn to accept your feelings-even the bad ones  While it may seem that the core problem is that you're powerless over food, emotional eating actually stems from feeling powerless over your emotions. You don't feel capable of dealing with your feelings head on, so you avoid them with food.  Recommended reading  Mini Habits for weight loss  Healthy Eating: A guide to the new nutrition - [place] Report  10 Tips for Mindful Eating - How mindfulness can help you fully enjoy a meal and the experience of eating-with moderation and restraint. ([place])  Weight Loss: Gain Control of Emotional Eating - Tips to regain control of your eating habits. [facility])  Why Stress Causes People to Overeat -Tips on controlling stress eating. ([place])  Mindful Eating Meditations -Free online mindfulness meditations. ([facility])

## 2017-12-29 ENCOUNTER — Other Ambulatory Visit: Payer: Self-pay

## 2017-12-29 MED ORDER — LISINOPRIL 40 MG PO TABS: 40.0000 mg | ORAL_TABLET | Freq: Every day | ORAL | 1 refills | Status: DC

## 2017-12-29 MED ORDER — HYDROCHLOROTHIAZIDE 12.5 MG PO TABS: 12.5000 mg | ORAL_TABLET | Freq: Every day | ORAL | 1 refills | Status: DC

## 2018-01-03 ENCOUNTER — Other Ambulatory Visit: Payer: Self-pay | Admitting: Physician Assistant

## 2018-01-04 ENCOUNTER — Encounter: Payer: Self-pay | Admitting: Obstetrics & Gynecology

## 2018-01-04 LAB — CBC WITH DIFFERENTIAL/PLATELET
BASOS: 0 %
Basophils Absolute: 0 10*3/uL (ref 0.0–0.2)
EOS (ABSOLUTE): 0.2 10*3/uL (ref 0.0–0.4)
Eos: 3 %
Hematocrit: 38.8 % (ref 34.0–46.6)
Hemoglobin: 13.3 g/dL (ref 11.1–15.9)
IMMATURE GRANULOCYTES: 0 %
Immature Grans (Abs): 0 10*3/uL (ref 0.0–0.1)
LYMPHS: 33 %
Lymphocytes Absolute: 2.1 10*3/uL (ref 0.7–3.1)
MCH: 32.5 pg (ref 26.6–33.0)
MCHC: 34.3 g/dL (ref 31.5–35.7)
MCV: 95 fL (ref 79–97)
MONOCYTES: 6 %
MONOS ABS: 0.4 10*3/uL (ref 0.1–0.9)
NEUTROS ABS: 3.7 10*3/uL (ref 1.4–7.0)
Neutrophils: 58 %
PLATELETS: 251 10*3/uL (ref 150–379)
RBC: 4.09 x10E6/uL (ref 3.77–5.28)
RDW: 13.8 % (ref 12.3–15.4)
WBC: 6.3 10*3/uL (ref 3.4–10.8)

## 2018-01-04 LAB — HEPATIC FUNCTION PANEL
ALBUMIN: 4.4 g/dL (ref 3.6–4.8)
ALT: 29 IU/L (ref 0–32)
AST: 23 IU/L (ref 0–40)
Alkaline Phosphatase: 109 IU/L (ref 39–117)
BILIRUBIN, DIRECT: 0.1 mg/dL (ref 0.00–0.40)
Bilirubin Total: 0.3 mg/dL (ref 0.0–1.2)
Total Protein: 6.5 g/dL (ref 6.0–8.5)

## 2018-01-04 LAB — BASIC METABOLIC PANEL
BUN/Creatinine Ratio: 30 — ABNORMAL HIGH (ref 12–28)
BUN: 21 mg/dL (ref 8–27)
CALCIUM: 9.9 mg/dL (ref 8.7–10.3)
CHLORIDE: 98 mmol/L (ref 96–106)
CO2: 26 mmol/L (ref 20–29)
Creatinine, Ser: 0.69 mg/dL (ref 0.57–1.00)
GFR calc Af Amer: 107 mL/min/{1.73_m2} (ref >59)
GFR calc non Af Amer: 93 mL/min/{1.73_m2} (ref >59)
GLUCOSE: 106 mg/dL — AB (ref 65–99)
POTASSIUM: 4.1 mmol/L (ref 3.5–5.2)
Sodium: 138 mmol/L (ref 134–144)

## 2018-01-04 LAB — LIPID PANEL W/O CHOL/HDL RATIO
Cholesterol, Total: 202 mg/dL — ABNORMAL HIGH (ref 100–199)
HDL: 56 mg/dL (ref >39)
LDL Calculated: 82 mg/dL (ref 0–99)
TRIGLYCERIDES: 321 mg/dL — AB (ref 0–149)
VLDL Cholesterol Cal: 64 mg/dL — ABNORMAL HIGH (ref 5–40)

## 2018-01-04 LAB — VITAMIN D 25 HYDROXY (VIT D DEFICIENCY, FRACTURES): VIT D 25 HYDROXY: 34.1 ng/mL (ref 30.0–100.0)

## 2018-02-02 ENCOUNTER — Encounter: Payer: Self-pay | Admitting: Physician Assistant

## 2018-02-10 ENCOUNTER — Other Ambulatory Visit: Payer: Self-pay

## 2018-02-10 DIAGNOSIS — M25562 Pain in left knee: Secondary | ICD-10-CM | POA: Diagnosis not present

## 2018-02-10 DIAGNOSIS — M238X2 Other internal derangements of left knee: Secondary | ICD-10-CM | POA: Diagnosis not present

## 2018-02-10 DIAGNOSIS — M545 Low back pain: Principal | ICD-10-CM

## 2018-02-10 DIAGNOSIS — Z96652 Presence of left artificial knee joint: Secondary | ICD-10-CM | POA: Insufficient documentation

## 2018-02-10 DIAGNOSIS — G8929 Other chronic pain: Secondary | ICD-10-CM

## 2018-02-10 DIAGNOSIS — Z471 Aftercare following joint replacement surgery: Secondary | ICD-10-CM | POA: Diagnosis not present

## 2018-02-10 DIAGNOSIS — M1712 Unilateral primary osteoarthritis, left knee: Secondary | ICD-10-CM | POA: Diagnosis not present

## 2018-02-10 MED ORDER — DICLOFENAC SODIUM 75 MG PO TBEC: 75.0000 mg | DELAYED_RELEASE_TABLET | Freq: Two times a day (BID) | ORAL | 0 refills | Status: DC

## 2018-04-01 ENCOUNTER — Other Ambulatory Visit: Payer: Self-pay | Admitting: Physician Assistant

## 2018-04-08 ENCOUNTER — Encounter: Payer: Self-pay | Admitting: Obstetrics & Gynecology

## 2018-04-11 ENCOUNTER — Other Ambulatory Visit: Payer: Self-pay | Admitting: Adult Health

## 2018-04-11 DIAGNOSIS — G8929 Other chronic pain: Secondary | ICD-10-CM

## 2018-04-11 DIAGNOSIS — M545 Low back pain, unspecified: Secondary | ICD-10-CM

## 2018-04-12 ENCOUNTER — Other Ambulatory Visit: Payer: Self-pay | Admitting: Adult Health

## 2018-04-19 ENCOUNTER — Other Ambulatory Visit: Payer: Self-pay

## 2018-04-19 MED ORDER — OMEPRAZOLE 40 MG PO CPDR: DELAYED_RELEASE_CAPSULE | ORAL | 0 refills | Status: DC

## 2018-04-19 NOTE — H&P (Signed)
TOTAL KNEE REVISION ADMISSION H&P  Patient is being admitted for left revision total knee arthroplasty.  Subjective:  Chief Complaint:left knee pain.  HPI: Maureen [name], 62 y.o. female, has a history of pain and functional disability in the left knee(s) due to failed previous arthroplasty and patient has failed non-surgical conservative treatments for greater than 12 weeks to include use of assistive devices, activity modification and physical therapy. The indications for the revision of the total knee arthroplasty are valgus deformity of the left knee. Onset of symptoms was abrupt starting after the original arthroplasty in November 2018 with stable course since that time.  Prior procedures on the left knee(s) include arthroplasty.  Patient currently rates pain in the left knee(s) at 4 out of 10 with activity. There is worsening of pain with activity and weight bearing and pain that interferes with activities of daily living.  Patient has evidence of 20 degrees of valgus deformity which appears to be from the femoral component by imaging studies. This condition presents safety issues increasing the risk of falls. There is no current active infection.  Patient Active Problem List   Diagnosis Date Noted  . Primary localized osteoarthritis of left knee 07/13/2017  . Primary osteoarthritis of right knee 02/16/2016  . Anemia 12/04/2015  . Female genuine stress incontinence 10/29/2015  . Arthritis, degenerative 10/29/2015  . Allergic rhinitis 10/29/2015  . Hyperlipidemia 12/03/2014  . Morbid obesity ([place]) 12/03/2014  . Vitamin D deficiency 12/03/2014  . Medication management 12/03/2014  . IBS (irritable bowel syndrome)   . GERD (gastroesophageal reflux disease)   . Hypertension   . Asthma   . LAP-BAND surgery status 01/17/2013   Past Medical History:  Diagnosis Date  . Allergy    seasonal  . Arthritis    "knees, back" (02/17/2016)  . GERD (gastroesophageal reflux disease)   . History  of hiatal hernia   . Hypertension    HX OF HYPERTENSION PRIOR TO LAP BANDING - WAS TAKEN OFF BP MED  . IBS (irritable bowel syndrome)   . Numbness    LEFT FOOT  . OSA (obstructive sleep apnea)    resolved with Lap Band/2nd test  . PONV (postoperative nausea and vomiting)    PONV with gallbladder - no PONV since that procedure    Past Surgical History:  Procedure Laterality Date  . ABDOMINAL HYSTERECTOMY  2004  . BREAST LUMPECTOMY Left 1972   benign tumor  . CATARACT EXTRACTION W/ INTRAOCULAR LENS  IMPLANT, BILATERAL Bilateral 05/2017  . COLONOSCOPY    . DILATION AND CURETTAGE OF UTERUS  "several"  . ESOPHAGOGASTRODUODENOSCOPY    . HERNIA REPAIR    . JOINT REPLACEMENT    . LAPAROSCOPIC CHOLECYSTECTOMY  2003  . LAPAROSCOPIC GASTRIC BANDING  11/05/2009   Dr. Hassell Done  . LAPAROSCOPIC GASTRIC BANDING WITH HIATAL HERNIA REPAIR N/A 01/22/2015   Procedure: LAPAROSCOPIC TAKEDOWN AND REVISION OF GASTRIC BAND REVISION WITH UPPER ENDOSCOPY;  Surgeon: Johnathan Hausen, MD;  Location: [facility];  Service: General;  Laterality: N/A;  . MOLE REMOVAL  "several"   "most in my upper abdomen"  . ORIF TOE FRACTURE Left    "little toe"  . TOTAL KNEE ARTHROPLASTY Right 02/17/2016  . TOTAL KNEE ARTHROPLASTY Right 02/17/2016   Procedure: TOTAL KNEE ARTHROPLASTY;  Surgeon: Frederik Pear, MD;  Location: [facility];  Service: Orthopedics;  Laterality: Right;  . TOTAL KNEE ARTHROPLASTY Left 07/13/2017   Procedure: TOTAL KNEE ARTHROPLASTY;  Surgeon: Hessie Knows, MD;  Location: [facility];  Service:  Orthopedics;  Laterality: Left;  . TUBAL LIGATION      No current facility-administered medications for this encounter.    Current Outpatient Medications  Medication Sig Dispense Refill Last Dose  . acetaminophen (TYLENOL) 500 MG tablet Take 1,000 mg by mouth every 6 (six) hours as needed for moderate pain or headache.   Taking  . diclofenac (VOLTAREN) 75 MG EC tablet TAKE ONE TABLET BY MOUTH TWICE DAILY 60 tablet 0   .  Ferrous Sulfate Dried (SLOW RELEASE IRON) 45 MG TBCR Take 45 mg by mouth at bedtime.   Taking  . hydrochlorothiazide (HYDRODIURIL) 12.5 MG tablet TAKE 1 TABLET BY MOUTH DAILY 90 tablet 1   . lisinopril (PRINIVIL,ZESTRIL) 40 MG tablet TAKE 1 TABLET BY MOUTH DAILY 90 tablet 1   . loratadine (CLARITIN) 10 MG tablet Take 10 mg by mouth daily.    Taking  . Multiple Vitamin (MULTIVITAMIN) capsule Take 1 capsule by mouth daily.    Taking  . omeprazole (PRILOSEC) 40 MG capsule TAKE 1 CAPSULE BY MOUTH EVERY DAY 90 capsule 0   . venlafaxine XR (EFFEXOR-XR) 75 MG 24 hr capsule TAKE 1 CAPSULE BY MOUTH  DAILY 90 capsule 1    Allergies  Allergen Reactions  . Mucinex [Guaifenesin Er] Other (See Comments)    "feels weird and dizzy"  . Tramadol Itching    Social History   Tobacco Use  . Smoking status: Former Smoker    Packs/day: 0.25    Years: 28.00    Pack years: 7.00    Types: Cigarettes    Last attempt to quit: 08/31/1998    Years since quitting: 19.6  . Smokeless tobacco: Never Used  Substance Use Topics  . Alcohol use: No    Family History  Problem Relation Age of Onset  . Hyperlipidemia Father   . Heart disease Father       Review of Systems  Constitutional: Negative for chills and fever.  HENT: Negative for congestion, sore throat and tinnitus.   Eyes: Negative for double vision, photophobia and pain.  Respiratory: Negative for cough, shortness of breath and wheezing.   Cardiovascular: Negative for chest pain, palpitations and orthopnea.  Gastrointestinal: Negative for heartburn, nausea and vomiting.  Genitourinary: Negative for dysuria, frequency and urgency.  Musculoskeletal: Positive for joint pain.  Neurological: Negative for dizziness, weakness and headaches.  Psychiatric/Behavioral: Negative for depression.     Objective:  Physical Exam  Well nourished and well developed. General: Alert and oriented x3, cooperative and pleasant, no acute distress. Head: normocephalic,  atraumatic, neck supple. Eyes: EOMI. Respiratory: breath sounds clear in all fields, no wheezing, rales, or rhonchi. Cardiovascular: Regular rate and rhythm, no murmurs, gallops or rubs.  Abdomen: non-tender to palpation and soft, normoactive bowel sounds. Musculoskeletal: Significant antalgic gait pattern functionally, with the left leg acting as if it is shorter on the right with the valgus deformity. It is throwing her hip off when she is walking. She is not using any assisted devices.  Left Knee Exam: No effusion. Significant valgus deformity of about 20 degrees. Range of motion is 0-120 degrees. No crepitus on range of motion of the knee. She has mild laxity with valgus stressing of the knee. Her deformity is not correctable. No medial or lateral joint line tenderness. Stable knee. Calves soft and nontender. Motor function intact in LE. Strength 5/5 LE bilaterally. Neuro: Distal pulses 2+. Sensation to light touch intact in LE.  Vital signs in last 24 hours: Blood pressure: 144/88 mmHg  Pulse: 88 bpm   Labs:  Estimated body mass index is 45.16 kg/m as calculated from the following:   Height as of 12/28/17: 5\' 1"  (1.549 m).   Weight as of 12/28/17: 108.4 kg.  Imaging Review Plain radiographs demonstrate tilting of the femoral component, putting the left knee into valgus deformity. The tibial component is aligned neutrally. The bone quality appears to be adequate for age and reported activity level.   Preoperative templating of the joint replacement has been completed, documented, and submitted to the Operating Room personnel in order to optimize intra-operative equipment management.   Assessment/Plan:  End stage arthritis, left knee(s) with failed previous arthroplasty.   The patient history, physical examination, clinical judgment of the provider and imaging studies are consistent with end stage degenerative joint disease of the left knee(s), previous total knee arthroplasty.  Revision total knee arthroplasty is deemed medically necessary. The treatment options including medical management, injection therapy, arthroscopy and revision arthroplasty were discussed at length. The risks and benefits of revision total knee arthroplasty were presented and reviewed. The risks due to aseptic loosening, infection, stiffness, patella tracking problems, thromboembolic complications and other imponderables were discussed. The patient acknowledged the explanation, agreed to proceed with the plan and consent was signed. Patient is being admitted for inpatient treatment for surgery, pain control, PT, OT, prophylactic antibiotics, VTE prophylaxis, progressive ambulation and ADL's and discharge planning.The patient is planning to be discharged home with outpatient physical therapy.   Therapy Plans: outpatient therapy at [facility] in [place] Disposition: Home with husband Planned DVT Prophylaxis: aspirin 325 mg BID DME needed: None PCP: [name], PA-C [facility]) TXA: IV Allergies: Tramadol (itching) Other: Pt has not been medically cleared by PCP, just saw approximately 3 months ago. Clearance form provided to patient.   - Patient was instructed on what medications to stop prior to surgery. - Follow-up visit in 2 weeks with Dr. Wynelle Link - Begin physical therapy following surgery - Pre-operative lab work as pre-surgical testing - Prescriptions will be provided in hospital at time of discharge  Theresa Duty, PA-C Orthopedic Surgery [facility] [place]

## 2018-04-20 ENCOUNTER — Encounter: Payer: Self-pay | Admitting: Physician Assistant

## 2018-04-21 ENCOUNTER — Encounter: Payer: Self-pay | Admitting: Physician Assistant

## 2018-04-26 NOTE — Progress Notes (Signed)
Assessment and Plan:  Maureen [name] was seen today for medical clearance.  Diagnoses and all orders for this visit:  Failed total left knee replacement, sequela Patient is low risk per provided ortho surgical guidelines excepting BMI of 45 Most recent EKG, CBC, CMP, A1C, UA reviewed and will be faxed with form The patient is cleared with low-moderate risk for surgery; recommend routine pre/post op precautions and interventions, Monitor BP before and after and suggest DVT prophylaxis.  Hold any medications per surgeon preference  Further disposition pending results of labs. Discussed med's effects and SE's.   Over 15 minutes of exam, counseling, chart review, and critical decision making was performed.   Future Appointments  Date Time Provider [place]  05/10/2018  3:30 PM [facility] PAT 4 [facility] None  06/27/2018  9:00 AM [name], PA-C [id number] None  07/12/2018 10:00 AM [name], MD [facility]    ------------------------------------------------------------------------------------------------------------------   HPI BP 120/80   Pulse 86   Temp (!) 97.5 F (36.4 C)   Ht 5\' 1"  (1.549 m)   Wt 242 lb 9.6 oz (110 kg)   SpO2 96%   BMI 45.84 kg/m   62 y.o.female morbidly obese, with htn, presents for surgical clearance for L TKA revision; she had surgery of left knee this past fall but has experienced significant discomfort and pain radiating to hip and lower back and has opted to revise after discussion with local orthopedist Dr. Wynelle Link and presents for surgical clearance today.  The patient has discussed likely spinal anesthesia with sedation but will finalize plan of care for anesthesia next week.   Lab Results  Component Value Date   WBC 6.3 01/03/2018   HGB 13.3 01/03/2018   HCT 38.8 01/03/2018   MCV 95 01/03/2018   PLT 251 01/03/2018   CMP     Component Value Date/Time   NA 138 01/03/2018 1644   K 4.1 01/03/2018 1644   CL 98 01/03/2018 1644   CO2  26 01/03/2018 1644   GLUCOSE 106 (H) 01/03/2018 1644   GLUCOSE 130 (H) 12/08/2017 0724   BUN 21 01/03/2018 1644   CREATININE 0.69 01/03/2018 1644   CREATININE 0.75 12/18/2015 1647   CALCIUM 9.9 01/03/2018 1644   PROT 6.5 01/03/2018 1644   ALBUMIN 4.4 01/03/2018 1644   AST 23 01/03/2018 1644   ALT 29 01/03/2018 1644   ALKPHOS 109 01/03/2018 1644   BILITOT 0.3 01/03/2018 1644   GFRNONAA 93 01/03/2018 1644   GFRNONAA 86 12/18/2015 1647   GFRAA 107 01/03/2018 1644   GFRAA >89 12/18/2015 1647   Lab Results  Component Value Date   HGBA1C 5.4 01/29/2017     Past Medical History:  Diagnosis Date  . Allergy    seasonal  . Arthritis    "knees, back" (02/17/2016)  . GERD (gastroesophageal reflux disease)   . History of hiatal hernia   . Hypertension    HX OF HYPERTENSION PRIOR TO LAP BANDING - WAS TAKEN OFF BP MED  . IBS (irritable bowel syndrome)   . Numbness    LEFT FOOT  . OSA (obstructive sleep apnea)    resolved with Lap Band/2nd test  . PONV (postoperative nausea and vomiting)    PONV with gallbladder - no PONV since that procedure     Allergies  Allergen Reactions  . Mucinex [Guaifenesin Er] Other (See Comments)    "feels weird and dizzy"  . Tramadol Itching    Current Outpatient Medications on File Prior to Visit  Medication  Sig  . acetaminophen (TYLENOL) 500 MG tablet Take 1,000 mg by mouth every 6 (six) hours as needed for moderate pain or headache.  . diclofenac (VOLTAREN) 75 MG EC tablet TAKE ONE TABLET BY MOUTH TWICE DAILY  . Ferrous Sulfate Dried (SLOW RELEASE IRON) 45 MG TBCR Take 45 mg by mouth at bedtime.  . hydrochlorothiazide (HYDRODIURIL) 12.5 MG tablet TAKE 1 TABLET BY MOUTH DAILY  . lisinopril (PRINIVIL,ZESTRIL) 40 MG tablet TAKE 1 TABLET BY MOUTH DAILY  . loratadine (CLARITIN) 10 MG tablet Take 10 mg by mouth daily.   . Multiple Vitamin (MULTIVITAMIN) capsule Take 1 capsule by mouth daily.   Marland Kitchen venlafaxine XR (EFFEXOR-XR) 75 MG 24 hr capsule TAKE 1  CAPSULE BY MOUTH  DAILY   No current facility-administered medications on file prior to visit.     ROS: Review of Systems  Constitutional: Negative for malaise/fatigue and weight loss.  HENT: Negative for hearing loss and tinnitus.   Eyes: Negative for blurred vision and double vision.  Respiratory: Negative for cough, shortness of breath and wheezing.   Cardiovascular: Negative for chest pain, palpitations, orthopnea, claudication and leg swelling.  Gastrointestinal: Negative for abdominal pain, blood in stool, constipation, diarrhea, heartburn, melena, nausea and vomiting.  Genitourinary: Negative.   Musculoskeletal: Positive for back pain and joint pain. Negative for myalgias.  Skin: Negative for rash.  Neurological: Negative for dizziness, tingling, sensory change, weakness and headaches.  Endo/Heme/Allergies: Negative for polydipsia.  Psychiatric/Behavioral: Negative.   All other systems reviewed and are negative.    Physical Exam:  BP 120/80   Pulse 86   Temp (!) 97.5 F (36.4 C)   Ht 5\' 1"  (1.549 m)   Wt 242 lb 9.6 oz (110 kg)   SpO2 96%   BMI 45.84 kg/m   General Appearance: Well nourished, in no apparent distress. Eyes: PERRLA, EOMs, conjunctiva no swelling or erythema Sinuses: No Frontal/maxillary tenderness ENT/Mouth: Ext aud canals clear, TMs without erythema, bulging. No erythema, swelling, or exudate on post pharynx.  Tonsils not swollen or erythematous. Hearing normal.  Neck: Supple, thyroid normal.  Respiratory: Respiratory effort normal, BS equal bilaterally without rales, rhonchi, wheezing or stridor.  Cardio: RRR with no MRGs. Brisk peripheral pulses without edema.  Abdomen: Soft, + BS.  Non tender, no guarding, rebound, hernias, masses. Lymphatics: Non tender without lymphadenopathy.  Musculoskeletal: Antalgic gait.  Skin: Warm, dry without rashes, lesions, ecchymosis.  Neuro: Cranial nerves intact. Normal muscle tone, no cerebellar symptoms.  Psych:  Awake and oriented X 3, normal affect, Insight and Judgment appropriate.     Izora Ribas, NP 2:48 PM [facility]

## 2018-04-27 ENCOUNTER — Encounter: Payer: Self-pay | Admitting: Adult Health

## 2018-04-27 ENCOUNTER — Ambulatory Visit (INDEPENDENT_AMBULATORY_CARE_PROVIDER_SITE_OTHER): Payer: Commercial Managed Care - PPO | Admitting: Adult Health

## 2018-04-27 VITALS — BP 120/80 | HR 86 | Temp 97.5°F | Ht 61.0 in | Wt 242.6 lb

## 2018-04-27 DIAGNOSIS — T84093S Other mechanical complication of internal left knee prosthesis, sequela: Secondary | ICD-10-CM

## 2018-04-27 DIAGNOSIS — M25562 Pain in left knee: Secondary | ICD-10-CM | POA: Diagnosis not present

## 2018-04-27 MED ORDER — OMEPRAZOLE 20 MG PO CPDR: 20.0000 mg | DELAYED_RELEASE_CAPSULE | Freq: Every day | ORAL | 1 refills | Status: DC

## 2018-05-09 NOTE — Patient Instructions (Addendum)
Maureen [name]  DOB  [birth year]    Your procedure is scheduled on:  05-18-2018   Report to [facility] Main  Entrance,   Report to admitting at  10:15 AM    Call this number if you have problems the morning of surgery [phone]                Remember: Do not eat food After Midnight.  May have clear liquid diet from midnight until 6:45 AM day of surgery.  After 6:45 AM nothing by mouth including candy, gum, mints with exception sip of water with medications    CLEAR LIQUID DIET   Foods Allowed                                                                     Foods Excluded  Coffee and tea, regular and decaf                             liquids that you cannot  Plain Jell-O in any flavor                                             see through such as: Fruit ices (not with fruit pulp)                                     milk, soups, orange juice  Iced Popsicles                                    All solid food Carbonated beverages, regular and diet                                    Cranberry, grape and apple juices Sports drinks like Gatorade Lightly seasoned clear broth or consume(fat free) Sugar, honey syrup  Sample Menu Breakfast                                Lunch                                     Supper Cranberry juice                    Beef broth                            Chicken broth Jell-O  Grape juice                           Apple juice Coffee or tea                        Jell-O                                      Popsicle                                                Coffee or tea                        Coffee or tea  _____________________________________________________________________     Take these medicines the morning of surgery with A SIP OF WATER:   LORATADINE(CLARITIN)                                You may not have any metal on your body including hair pins and               piercings                Do not wear jewelry, make-up, lotions, powders or perfumes, deodorant                          Do not wear nail polish.  Do not shave  48 hours prior to surgery.     Do not bring valuables to the hospital. [place].  Contacts, dentures or bridgework may not be worn into surgery.  Leave suitcase in the car. After surgery it may be brought to your room.   Special Instructions: N/A              Please read over the following fact sheets you were given:   _____________________________________________________________________                                                               WHAT IS A BLOOD TRANSFUSION? Blood Transfusion Information  A transfusion is the replacement of blood or some of its parts. Blood is made up of multiple cells which provide different functions.  Red blood cells carry oxygen and are used for blood loss replacement.  White blood cells fight against infection.  Platelets control bleeding.  Plasma helps clot blood.  Other blood products are available for specialized needs, such as hemophilia or other clotting disorders. BEFORE THE TRANSFUSION  Who gives blood for transfusions?   Healthy volunteers who are fully evaluated to make sure their blood is safe. This is blood bank blood. Transfusion therapy is the safest it has ever been in the practice of medicine. Before blood is taken from a donor, a complete history is taken to  make sure that person has no history of diseases nor engages in risky social behavior (examples are intravenous drug use or sexual activity with multiple partners). The donor's travel history is screened to minimize risk of transmitting infections, such as malaria. The donated blood is tested for signs of infectious diseases, such as HIV and hepatitis. The blood is then tested to be sure it is compatible with you in order to minimize the chance of a transfusion  reaction. If you or a relative donates blood, this is often done in anticipation of surgery and is not appropriate for emergency situations. It takes many days to process the donated blood. RISKS AND COMPLICATIONS Although transfusion therapy is very safe and saves many lives, the main dangers of transfusion include:   Getting an infectious disease.  Developing a transfusion reaction. This is an allergic reaction to something in the blood you were given. Every precaution is taken to prevent this. The decision to have a blood transfusion has been considered carefully by your caregiver before blood is given. Blood is not given unless the benefits outweigh the risks. AFTER THE TRANSFUSION  Right after receiving a blood transfusion, you will usually feel much better and more energetic. This is especially true if your red blood cells have gotten low (anemic). The transfusion raises the level of the red blood cells which carry oxygen, and this usually causes an energy increase.  The nurse administering the transfusion will monitor you carefully for complications. HOME CARE INSTRUCTIONS  No special instructions are needed after a transfusion. You may find your energy is better. Speak with your caregiver about any limitations on activity for underlying diseases you may have. SEEK MEDICAL CARE IF:   Your condition is not improving after your transfusion.  You develop redness or irritation at the intravenous (IV) site. SEEK IMMEDIATE MEDICAL CARE IF:  Any of the following symptoms occur over the next 12 hours:  Shaking chills.  You have a temperature by mouth above 102 F (38.9 C), not controlled by medicine.  Chest, back, or muscle pain.  People around you feel you are not acting correctly or are confused.  Shortness of breath or difficulty breathing.  Dizziness and fainting.  You get a rash or develop hives.  You have a decrease in urine output.  Your urine turns a dark color or  changes to pink, red, or brown. Any of the following symptoms occur over the next 10 days:  You have a temperature by mouth above 102 F (38.9 C), not controlled by medicine.  Shortness of breath.  Weakness after normal activity.  The white part of the eye turns yellow (jaundice).  You have a decrease in the amount of urine or are urinating less often.  Your urine turns a dark color or changes to pink, red, or brown. Document Released: 08/14/2000 Document Revised: 11/09/2011 Document Reviewed: 04/02/2008 ExitCare Patient Information 2014 [place].    __________________________________________________Incentive Spirometer  An incentive spirometer is a tool that can help keep your lungs clear and active. This tool measures how well you are filling your lungs with each breath. Taking long deep breaths may help reverse or decrease the chance of developing breathing (pulmonary) problems (especially infection) following:  A long period of time when you are unable to move or be active. BEFORE THE PROCEDURE   If the spirometer includes an indicator to show your best effort, your nurse or respiratory therapist will set it to a desired goal.  If possible, sit up  straight or lean slightly forward. Try not to slouch.  Hold the incentive spirometer in an upright position. INSTRUCTIONS FOR USE  1. Sit on the edge of your bed if possible, or sit up as far as you can in bed or on a chair. 2. Hold the incentive spirometer in an upright position. 3. Breathe out normally. 4. Place the mouthpiece in your mouth and seal your lips tightly around it. 5. Breathe in slowly and as deeply as possible, raising the piston or the ball toward the top of the column. 6. Hold your breath for 3-5 seconds or for as long as possible. Allow the piston or ball to fall to the bottom of the column. 7. Remove the mouthpiece from your mouth and breathe out normally. 8. Rest for a few seconds and repeat Steps 1  through 7 at least 10 times every 1-2 hours when you are awake. Take your time and take a few normal breaths between deep breaths. 9. The spirometer may include an indicator to show your best effort. Use the indicator as a goal to work toward during each repetition. 10. After each set of 10 deep breaths, practice coughing to be sure your lungs are clear. If you have an incision (the cut made at the time of surgery), support your incision when coughing by placing a pillow or rolled up towels firmly against it. Once you are able to get out of bed, walk around indoors and cough well. You may stop using the incentive spirometer when instructed by your caregiver.  RISKS AND COMPLICATIONS  Take your time so you do not get dizzy or light-headed.  If you are in pain, you may need to take or ask for pain medication before doing incentive spirometry. It is harder to take a deep breath if you are having pain. AFTER USE  Rest and breathe slowly and easily.  It can be helpful to keep track of a log of your progress. Your caregiver can provide you with a simple table to help with this. If you are using the spirometer at home, follow these instructions: [place] IF:   You are having difficultly using the spirometer.  You have trouble using the spirometer as often as instructed.  Your pain medication is not giving enough relief while using the spirometer.  You develop fever of 100.5 F (38.1 C) or higher. SEEK IMMEDIATE MEDICAL CARE IF:   You cough up bloody sputum that had not been present before.  You develop fever of 102 F (38.9 C) or greater.  You develop worsening pain at or near the incision site. MAKE SURE YOU:   Understand these instructions.  Will watch your condition.  Will get help right away if you are not doing well or get worse. Document Released: 12/28/2006 Document Revised: 11/09/2011 Document Reviewed: 02/28/2007 ExitCare Patient Information 2014 [place].   ________________________________________________________________________ [facility] - Preparing for Surgery Before surgery, you can play an important role.  Because skin is not sterile, your skin needs to be as free of germs as possible.  You can reduce the number of germs on your skin by washing with CHG (chlorahexidine gluconate) soap before surgery.  CHG is an antiseptic cleaner which kills germs and bonds with the skin to continue killing germs even after washing. Please DO NOT use if you have an allergy to CHG or antibacterial soaps.  If your skin becomes reddened/irritated stop using the CHG and inform your nurse when you arrive at Short  Stay. Do not shave (including legs and underarms) for at least 48 hours prior to the first CHG shower.  You may shave your face/neck. Please follow these instructions carefully:  1.  Shower with CHG Soap the night before surgery and the  morning of Surgery.  2.  If you choose to wash your hair, wash your hair first as usual with your  normal  shampoo.  3.  After you shampoo, rinse your hair and body thoroughly to remove the  shampoo.                            4.  Use CHG as you would any other liquid soap.  You can apply chg directly  to the skin and wash                       Gently with a scrungie or clean washcloth.  5.  Apply the CHG Soap to your body ONLY FROM THE NECK DOWN.   Do not use on face/ open                           Wound or open sores. Avoid contact with eyes, ears mouth and genitals (private parts).                       Wash face,  Genitals (private parts) with your normal soap.             6.  Wash thoroughly, paying special attention to the area where your surgery  will be performed.  7.  Thoroughly rinse your body with warm water from the neck down.  8.  DO NOT shower/wash with your normal soap after using and rinsing off  the CHG Soap.             9.  Pat yourself dry with a clean towel.            10.  Wear clean pajamas.             11.  Place clean sheets on your bed the night of your first shower and do not  sleep with pets. Day of Surgery : Do not apply any lotions/deodorants the morning of surgery.  Please wear clean clothes to the hospital/surgery center.  FAILURE TO FOLLOW THESE INSTRUCTIONS MAY RESULT IN THE CANCELLATION OF YOUR SURGERY PATIENT SIGNATURE_________________________________  NURSE SIGNATURE__________________________________  ________________________________________________________________________

## 2018-05-10 ENCOUNTER — Other Ambulatory Visit: Payer: Self-pay

## 2018-05-10 ENCOUNTER — Encounter (HOSPITAL_COMMUNITY)
Admission: RE | Admit: 2018-05-10 | Discharge: 2018-05-10 | Disposition: A | Discharge status: Home / Self Care | Payer: Managed Care, Other (non HMO) | Source: Ambulatory Visit | Attending: Orthopedic Surgery | Admitting: Orthopedic Surgery

## 2018-05-10 ENCOUNTER — Encounter (HOSPITAL_COMMUNITY): Payer: Self-pay

## 2018-05-10 DIAGNOSIS — I10 Essential (primary) hypertension: Secondary | ICD-10-CM | POA: Insufficient documentation

## 2018-05-10 DIAGNOSIS — Z01812 Encounter for preprocedural laboratory examination: Secondary | ICD-10-CM | POA: Diagnosis not present

## 2018-05-10 DIAGNOSIS — Y792 Prosthetic and other implants, materials and accessory orthopedic devices associated with adverse incidents: Secondary | ICD-10-CM | POA: Diagnosis not present

## 2018-05-10 DIAGNOSIS — Y838 Other surgical procedures as the cause of abnormal reaction of the patient, or of later complication, without mention of misadventure at the time of the procedure: Secondary | ICD-10-CM | POA: Insufficient documentation

## 2018-05-10 DIAGNOSIS — Z01810 Encounter for preprocedural cardiovascular examination: Secondary | ICD-10-CM | POA: Diagnosis not present

## 2018-05-10 HISTORY — DX: Personal history of other diseases of the nervous system and sense organs: Z86.69

## 2018-05-10 HISTORY — DX: Other seasonal allergic rhinitis: J30.2

## 2018-05-10 HISTORY — DX: Other specified disorders of nose and nasal sinuses: J34.89

## 2018-05-10 HISTORY — DX: Stress incontinence (female) (male): N39.3

## 2018-05-10 HISTORY — DX: Snoring: R06.83

## 2018-05-10 LAB — COMPREHENSIVE METABOLIC PANEL
ALT: 32 U/L (ref 0–44)
AST: 24 U/L (ref 15–41)
Albumin: 3.9 g/dL (ref 3.5–5.0)
Alkaline Phosphatase: 94 U/L (ref 38–126)
Anion gap: 10 (ref 5–15)
BUN: 23 mg/dL (ref 8–23)
CHLORIDE: 104 mmol/L (ref 98–111)
CO2: 28 mmol/L (ref 22–32)
Calcium: 9.7 mg/dL (ref 8.9–10.3)
Creatinine, Ser: 0.89 mg/dL (ref 0.44–1.00)
GFR calc Af Amer: >60 mL/min (ref >60)
Glucose, Bld: 103 mg/dL — ABNORMAL HIGH (ref 70–99)
POTASSIUM: 5.1 mmol/L (ref 3.5–5.1)
SODIUM: 142 mmol/L (ref 135–145)
Total Bilirubin: 0.5 mg/dL (ref 0.3–1.2)
Total Protein: 6.9 g/dL (ref 6.5–8.1)

## 2018-05-10 LAB — CBC
HCT: 41.7 % (ref 36.0–46.0)
Hemoglobin: 13.6 g/dL (ref 12.0–15.0)
MCH: 32.9 pg (ref 26.0–34.0)
MCHC: 32.6 g/dL (ref 30.0–36.0)
MCV: 101 fL — AB (ref 78.0–100.0)
PLATELETS: 248 10*3/uL (ref 150–400)
RBC: 4.13 MIL/uL (ref 3.87–5.11)
RDW: 13.3 % (ref 11.5–15.5)
WBC: 6.3 10*3/uL (ref 4.0–10.5)

## 2018-05-10 LAB — APTT: APTT: 28 s (ref 24–36)

## 2018-05-10 LAB — SURGICAL PCR SCREEN
MRSA, PCR: NEGATIVE
STAPHYLOCOCCUS AUREUS: NEGATIVE

## 2018-05-10 LAB — PROTIME-INR
INR: 0.9
PROTHROMBIN TIME: 12 s (ref 11.4–15.2)

## 2018-05-11 ENCOUNTER — Other Ambulatory Visit: Payer: Self-pay

## 2018-05-11 DIAGNOSIS — M5441 Lumbago with sciatica, right side: Secondary | ICD-10-CM

## 2018-05-11 LAB — ABO/RH: ABO/RH(D): O NEG

## 2018-05-11 MED ORDER — AZITHROMYCIN 250 MG PO TABS: ORAL_TABLET | ORAL | 0 refills | Status: AC

## 2018-05-11 MED ORDER — PREDNISONE 20 MG PO TABS: ORAL_TABLET | ORAL | 0 refills | Status: DC

## 2018-05-11 NOTE — Progress Notes (Signed)
Final EKg done 05/10/18-epic

## 2018-05-11 NOTE — Telephone Encounter (Signed)
Pt calls to report having knee surgery next wk and now she is getting congestion & bad cough.    Meds have been called into pharmacy and the pt has been made aware.

## 2018-05-12 ENCOUNTER — Encounter (HOSPITAL_COMMUNITY): Payer: Self-pay

## 2018-05-12 NOTE — Progress Notes (Signed)
Returned pt call via phone.  Per pt has sinus congestion and went to pcp. She gave two medications to add  to her medication , updated epic.

## 2018-05-17 MED ORDER — BUPIVACAINE LIPOSOME 1.3 % IJ SUSP
20.0000 mL | Freq: Once | INTRAMUSCULAR | Status: DC
  Filled 2018-05-17: qty 20

## 2018-05-18 ENCOUNTER — Inpatient Hospital Stay (HOSPITAL_COMMUNITY): Payer: Managed Care, Other (non HMO) | Admitting: Certified Registered Nurse Anesthetist

## 2018-05-18 ENCOUNTER — Encounter (HOSPITAL_COMMUNITY): Payer: Self-pay | Admitting: General Practice

## 2018-05-18 ENCOUNTER — Encounter (HOSPITAL_COMMUNITY)
Admission: RE | Disposition: A | Discharge status: Home / Self Care | Payer: Self-pay | Source: Other Acute Inpatient Hospital | Attending: Orthopedic Surgery

## 2018-05-18 ENCOUNTER — Inpatient Hospital Stay (HOSPITAL_COMMUNITY)
Admission: RE | Admit: 2018-05-18 | Discharge: 2018-05-20 | DRG: 467 | Disposition: A | Discharge status: Home / Self Care | Payer: Managed Care, Other (non HMO) | Source: Other Acute Inpatient Hospital | Attending: Orthopedic Surgery | Admitting: Orthopedic Surgery

## 2018-05-18 ENCOUNTER — Other Ambulatory Visit: Payer: Self-pay

## 2018-05-18 DIAGNOSIS — M21062 Valgus deformity, not elsewhere classified, left knee: Secondary | ICD-10-CM | POA: Diagnosis not present

## 2018-05-18 DIAGNOSIS — T84023A Instability of internal left knee prosthesis, initial encounter: Secondary | ICD-10-CM | POA: Diagnosis not present

## 2018-05-18 DIAGNOSIS — Z6841 Body Mass Index (BMI) 40.0 and over, adult: Secondary | ICD-10-CM | POA: Diagnosis not present

## 2018-05-18 DIAGNOSIS — K219 Gastro-esophageal reflux disease without esophagitis: Secondary | ICD-10-CM | POA: Diagnosis present

## 2018-05-18 DIAGNOSIS — Z9181 History of falling: Secondary | ICD-10-CM | POA: Diagnosis not present

## 2018-05-18 DIAGNOSIS — Z9884 Bariatric surgery status: Secondary | ICD-10-CM | POA: Diagnosis not present

## 2018-05-18 DIAGNOSIS — Z888 Allergy status to other drugs, medicaments and biological substances status: Secondary | ICD-10-CM | POA: Diagnosis not present

## 2018-05-18 DIAGNOSIS — M1712 Unilateral primary osteoarthritis, left knee: Secondary | ICD-10-CM | POA: Diagnosis present

## 2018-05-18 DIAGNOSIS — Z96651 Presence of right artificial knee joint: Secondary | ICD-10-CM | POA: Diagnosis present

## 2018-05-18 DIAGNOSIS — E785 Hyperlipidemia, unspecified: Secondary | ICD-10-CM | POA: Diagnosis present

## 2018-05-18 DIAGNOSIS — Z79899 Other long term (current) drug therapy: Secondary | ICD-10-CM | POA: Diagnosis not present

## 2018-05-18 DIAGNOSIS — Z885 Allergy status to narcotic agent status: Secondary | ICD-10-CM | POA: Diagnosis not present

## 2018-05-18 DIAGNOSIS — I10 Essential (primary) hypertension: Secondary | ICD-10-CM | POA: Diagnosis present

## 2018-05-18 DIAGNOSIS — Y792 Prosthetic and other implants, materials and accessory orthopedic devices associated with adverse incidents: Secondary | ICD-10-CM | POA: Diagnosis present

## 2018-05-18 DIAGNOSIS — Z96659 Presence of unspecified artificial knee joint: Secondary | ICD-10-CM

## 2018-05-18 DIAGNOSIS — J302 Other seasonal allergic rhinitis: Secondary | ICD-10-CM | POA: Diagnosis present

## 2018-05-18 DIAGNOSIS — Z87891 Personal history of nicotine dependence: Secondary | ICD-10-CM

## 2018-05-18 DIAGNOSIS — T84018A Broken internal joint prosthesis, other site, initial encounter: Secondary | ICD-10-CM

## 2018-05-18 HISTORY — PX: TOTAL KNEE REVISION: SHX996

## 2018-05-18 LAB — TYPE AND SCREEN
ABO/RH(D): O NEG
ANTIBODY SCREEN: NEGATIVE
WEAK D: POSITIVE

## 2018-05-18 SURGERY — TOTAL KNEE REVISION
Anesthesia: Spinal | Site: Knee | Laterality: Left

## 2018-05-18 MED ORDER — STERILE WATER FOR IRRIGATION IR SOLN
Status: DC | PRN
  Administered 2018-05-18: 2000 mL

## 2018-05-18 MED ORDER — METHOCARBAMOL 500 MG IVPB - SIMPLE MED
500.0000 mg | Freq: Four times a day (QID) | INTRAVENOUS | Status: DC | PRN
  Administered 2018-05-18: 500 mg via INTRAVENOUS
  Filled 2018-05-18: qty 50

## 2018-05-18 MED ORDER — LIDOCAINE 2% (20 MG/ML) 5 ML SYRINGE
INTRAMUSCULAR | Status: AC
  Filled 2018-05-18: qty 5

## 2018-05-18 MED ORDER — PROPOFOL 10 MG/ML IV BOLUS
INTRAVENOUS | Status: AC
  Filled 2018-05-18: qty 20

## 2018-05-18 MED ORDER — FLEET ENEMA 7-19 GM/118ML RE ENEM: 1.0000 | ENEMA | Freq: Once | RECTAL | Status: DC | PRN

## 2018-05-18 MED ORDER — LIDOCAINE HCL (CARDIAC) PF 100 MG/5ML IV SOSY
PREFILLED_SYRINGE | INTRAVENOUS | Status: DC | PRN
  Administered 2018-05-18: 60 mg via INTRAVENOUS

## 2018-05-18 MED ORDER — ONDANSETRON HCL 4 MG/2ML IJ SOLN
INTRAMUSCULAR | Status: DC | PRN
  Administered 2018-05-18: 4 mg via INTRAVENOUS

## 2018-05-18 MED ORDER — ONDANSETRON HCL 4 MG/2ML IJ SOLN
INTRAMUSCULAR | Status: AC
  Filled 2018-05-18: qty 2

## 2018-05-18 MED ORDER — HYDROMORPHONE HCL 1 MG/ML IJ SOLN
0.5000 mg | INTRAMUSCULAR | Status: DC | PRN
  Administered 2018-05-18: 0.5 mg via INTRAVENOUS
  Filled 2018-05-18: qty 1

## 2018-05-18 MED ORDER — DEXAMETHASONE SODIUM PHOSPHATE 10 MG/ML IJ SOLN: 8.0000 mg | Freq: Once | INTRAMUSCULAR | Status: DC

## 2018-05-18 MED ORDER — SODIUM CHLORIDE 0.9 % IV SOLN
INTRAVENOUS | Status: DC
  Administered 2018-05-18: 22:00:00 via INTRAVENOUS

## 2018-05-18 MED ORDER — SODIUM CHLORIDE 0.9 % IV SOLN
1000.0000 mg | INTRAVENOUS | Status: AC
  Administered 2018-05-18: 1000 mg via INTRAVENOUS
  Filled 2018-05-18: qty 10

## 2018-05-18 MED ORDER — SODIUM CHLORIDE 0.9 % IJ SOLN
INTRAMUSCULAR | Status: AC
  Filled 2018-05-18: qty 10

## 2018-05-18 MED ORDER — BISACODYL 10 MG RE SUPP: 10.0000 mg | Freq: Every day | RECTAL | Status: DC | PRN

## 2018-05-18 MED ORDER — PROPOFOL 500 MG/50ML IV EMUL
INTRAVENOUS | Status: DC | PRN
  Administered 2018-05-18: 100 ug/kg/min via INTRAVENOUS

## 2018-05-18 MED ORDER — SODIUM CHLORIDE 0.9 % IJ SOLN
INTRAMUSCULAR | Status: DC | PRN
  Administered 2018-05-18: 60 mL

## 2018-05-18 MED ORDER — PANTOPRAZOLE SODIUM 40 MG PO TBEC
40.0000 mg | DELAYED_RELEASE_TABLET | Freq: Every day | ORAL | Status: DC
  Administered 2018-05-19 – 2018-05-20 (×2): 40 mg via ORAL
  Filled 2018-05-18: qty 1

## 2018-05-18 MED ORDER — FENTANYL CITRATE (PF) 100 MCG/2ML IJ SOLN: 25.0000 ug | INTRAMUSCULAR | Status: DC | PRN

## 2018-05-18 MED ORDER — PHENOL 1.4 % MT LIQD
1.0000 | OROMUCOSAL | Status: DC | PRN
  Filled 2018-05-18: qty 177

## 2018-05-18 MED ORDER — PROPOFOL 10 MG/ML IV BOLUS
INTRAVENOUS | Status: AC
  Filled 2018-05-18: qty 60

## 2018-05-18 MED ORDER — MENTHOL 3 MG MT LOZG: 1.0000 | LOZENGE | OROMUCOSAL | Status: DC | PRN

## 2018-05-18 MED ORDER — BUPIVACAINE IN DEXTROSE 0.75-8.25 % IT SOLN
INTRATHECAL | Status: DC | PRN
  Administered 2018-05-18: 2 mL via INTRATHECAL

## 2018-05-18 MED ORDER — CEFAZOLIN SODIUM-DEXTROSE 2-4 GM/100ML-% IV SOLN
2.0000 g | Freq: Four times a day (QID) | INTRAVENOUS | Status: AC
  Administered 2018-05-18 (×2): 2 g via INTRAVENOUS
  Filled 2018-05-18 (×2): qty 100

## 2018-05-18 MED ORDER — DIPHENHYDRAMINE HCL 12.5 MG/5ML PO ELIX
12.5000 mg | ORAL_SOLUTION | ORAL | Status: DC | PRN
  Administered 2018-05-18: 25 mg via ORAL
  Filled 2018-05-18 (×2): qty 10

## 2018-05-18 MED ORDER — OXYCODONE HCL 5 MG PO TABS
10.0000 mg | ORAL_TABLET | ORAL | Status: DC | PRN
  Administered 2018-05-18 – 2018-05-19 (×4): 15 mg via ORAL
  Filled 2018-05-18 (×3): qty 3
  Filled 2018-05-18: qty 2
  Filled 2018-05-18 (×2): qty 3

## 2018-05-18 MED ORDER — DEXAMETHASONE SODIUM PHOSPHATE 10 MG/ML IJ SOLN
INTRAMUSCULAR | Status: AC
  Filled 2018-05-18: qty 1

## 2018-05-18 MED ORDER — ACETAMINOPHEN 10 MG/ML IV SOLN
1000.0000 mg | Freq: Four times a day (QID) | INTRAVENOUS | Status: DC
  Administered 2018-05-18: 1000 mg via INTRAVENOUS
  Filled 2018-05-18: qty 100

## 2018-05-18 MED ORDER — CHLORHEXIDINE GLUCONATE 4 % EX LIQD: 60.0000 mL | Freq: Once | CUTANEOUS | Status: DC

## 2018-05-18 MED ORDER — ACETAMINOPHEN 500 MG PO TABS
1000.0000 mg | ORAL_TABLET | Freq: Four times a day (QID) | ORAL | Status: AC
  Administered 2018-05-18 – 2018-05-19 (×4): 1000 mg via ORAL
  Filled 2018-05-18 (×4): qty 2

## 2018-05-18 MED ORDER — METOCLOPRAMIDE HCL 5 MG PO TABS: 5.0000 mg | ORAL_TABLET | Freq: Three times a day (TID) | ORAL | Status: DC | PRN

## 2018-05-18 MED ORDER — HYDROCHLOROTHIAZIDE 25 MG PO TABS
12.5000 mg | ORAL_TABLET | Freq: Every day | ORAL | Status: DC
  Administered 2018-05-19 – 2018-05-20 (×2): 12.5 mg via ORAL
  Filled 2018-05-18 (×2): qty 1

## 2018-05-18 MED ORDER — LACTATED RINGERS IV SOLN
INTRAVENOUS | Status: DC
  Administered 2018-05-18 (×2): via INTRAVENOUS

## 2018-05-18 MED ORDER — PROPOFOL 10 MG/ML IV BOLUS
INTRAVENOUS | Status: DC | PRN
  Administered 2018-05-18: 20 mg via INTRAVENOUS
  Administered 2018-05-18: 30 mg via INTRAVENOUS

## 2018-05-18 MED ORDER — METHOCARBAMOL 500 MG PO TABS
500.0000 mg | ORAL_TABLET | Freq: Four times a day (QID) | ORAL | Status: DC | PRN
  Administered 2018-05-18 – 2018-05-20 (×5): 500 mg via ORAL
  Filled 2018-05-18 (×7): qty 1

## 2018-05-18 MED ORDER — ONDANSETRON HCL 4 MG/2ML IJ SOLN: 4.0000 mg | Freq: Four times a day (QID) | INTRAMUSCULAR | Status: DC | PRN

## 2018-05-18 MED ORDER — OXYCODONE HCL 5 MG PO TABS
5.0000 mg | ORAL_TABLET | ORAL | Status: DC | PRN
  Administered 2018-05-18: 10 mg via ORAL
  Administered 2018-05-19: 5 mg via ORAL
  Administered 2018-05-19 – 2018-05-20 (×3): 10 mg via ORAL
  Filled 2018-05-18 (×2): qty 2
  Filled 2018-05-18: qty 1
  Filled 2018-05-18: qty 2

## 2018-05-18 MED ORDER — GABAPENTIN 300 MG PO CAPS
300.0000 mg | ORAL_CAPSULE | Freq: Three times a day (TID) | ORAL | Status: DC
  Administered 2018-05-18 – 2018-05-20 (×5): 300 mg via ORAL
  Filled 2018-05-18 (×5): qty 1

## 2018-05-18 MED ORDER — DEXAMETHASONE SODIUM PHOSPHATE 10 MG/ML IJ SOLN
10.0000 mg | Freq: Once | INTRAMUSCULAR | Status: AC
  Administered 2018-05-19: 10 mg via INTRAVENOUS
  Filled 2018-05-18: qty 1

## 2018-05-18 MED ORDER — VENLAFAXINE HCL ER 75 MG PO CP24
75.0000 mg | ORAL_CAPSULE | Freq: Every day | ORAL | Status: DC
  Administered 2018-05-18 – 2018-05-19 (×2): 75 mg via ORAL
  Filled 2018-05-18 (×2): qty 1

## 2018-05-18 MED ORDER — SODIUM CHLORIDE 0.9 % IJ SOLN
INTRAMUSCULAR | Status: AC
  Filled 2018-05-18: qty 50

## 2018-05-18 MED ORDER — LORATADINE 10 MG PO TABS
10.0000 mg | ORAL_TABLET | Freq: Every morning | ORAL | Status: DC
  Administered 2018-05-19 – 2018-05-20 (×2): 10 mg via ORAL
  Filled 2018-05-18 (×2): qty 1

## 2018-05-18 MED ORDER — SODIUM CHLORIDE 0.9 % IR SOLN
Status: DC | PRN
  Administered 2018-05-18: 1000 mL

## 2018-05-18 MED ORDER — CEFAZOLIN SODIUM-DEXTROSE 2-4 GM/100ML-% IV SOLN
2.0000 g | INTRAVENOUS | Status: AC
  Administered 2018-05-18: 2 g via INTRAVENOUS
  Filled 2018-05-18: qty 100

## 2018-05-18 MED ORDER — BUPIVACAINE LIPOSOME 1.3 % IJ SUSP
INTRAMUSCULAR | Status: DC | PRN
  Administered 2018-05-18: 20 mL

## 2018-05-18 MED ORDER — DOCUSATE SODIUM 100 MG PO CAPS
100.0000 mg | ORAL_CAPSULE | Freq: Two times a day (BID) | ORAL | Status: DC
  Administered 2018-05-18 – 2018-05-20 (×4): 100 mg via ORAL
  Filled 2018-05-18 (×4): qty 1

## 2018-05-18 MED ORDER — MIDAZOLAM HCL 2 MG/2ML IJ SOLN
1.0000 mg | INTRAMUSCULAR | Status: DC
  Administered 2018-05-18: 2 mg via INTRAVENOUS
  Filled 2018-05-18: qty 2

## 2018-05-18 MED ORDER — GLYCOPYRROLATE 0.2 MG/ML IJ SOLN
INTRAMUSCULAR | Status: DC | PRN
  Administered 2018-05-18: 0.1 mg via INTRAVENOUS

## 2018-05-18 MED ORDER — ONDANSETRON HCL 4 MG PO TABS: 4.0000 mg | ORAL_TABLET | Freq: Four times a day (QID) | ORAL | Status: DC | PRN

## 2018-05-18 MED ORDER — FENTANYL CITRATE (PF) 100 MCG/2ML IJ SOLN
100.0000 ug | INTRAMUSCULAR | Status: DC
  Administered 2018-05-18: 25 ug via INTRAVENOUS
  Administered 2018-05-18: 75 ug via INTRAVENOUS
  Filled 2018-05-18: qty 2

## 2018-05-18 MED ORDER — 0.9 % SODIUM CHLORIDE (POUR BTL) OPTIME
TOPICAL | Status: DC | PRN
  Administered 2018-05-18: 1000 mL

## 2018-05-18 MED ORDER — TRANEXAMIC ACID 1000 MG/10ML IV SOLN
1000.0000 mg | Freq: Once | INTRAVENOUS | Status: AC
  Administered 2018-05-18: 1000 mg via INTRAVENOUS
  Filled 2018-05-18: qty 1000

## 2018-05-18 MED ORDER — ASPIRIN EC 325 MG PO TBEC
325.0000 mg | DELAYED_RELEASE_TABLET | Freq: Two times a day (BID) | ORAL | Status: DC
  Administered 2018-05-19 – 2018-05-20 (×3): 325 mg via ORAL
  Filled 2018-05-18 (×3): qty 1

## 2018-05-18 MED ORDER — PHENYLEPHRINE 40 MCG/ML (10ML) SYRINGE FOR IV PUSH (FOR BLOOD PRESSURE SUPPORT)
PREFILLED_SYRINGE | INTRAVENOUS | Status: AC
  Filled 2018-05-18: qty 20

## 2018-05-18 MED ORDER — METHOCARBAMOL 500 MG IVPB - SIMPLE MED
INTRAVENOUS | Status: AC
  Filled 2018-05-18: qty 50

## 2018-05-18 MED ORDER — PHENYLEPHRINE 40 MCG/ML (10ML) SYRINGE FOR IV PUSH (FOR BLOOD PRESSURE SUPPORT)
PREFILLED_SYRINGE | INTRAVENOUS | Status: DC | PRN
  Administered 2018-05-18 (×8): 80 ug via INTRAVENOUS

## 2018-05-18 MED ORDER — POLYETHYLENE GLYCOL 3350 17 G PO PACK: 17.0000 g | PACK | Freq: Every day | ORAL | Status: DC | PRN

## 2018-05-18 MED ORDER — METOCLOPRAMIDE HCL 5 MG/ML IJ SOLN: 5.0000 mg | Freq: Three times a day (TID) | INTRAMUSCULAR | Status: DC | PRN

## 2018-05-18 MED ORDER — DEXAMETHASONE SODIUM PHOSPHATE 10 MG/ML IJ SOLN
INTRAMUSCULAR | Status: DC | PRN
  Administered 2018-05-18: 10 mg via INTRAVENOUS

## 2018-05-18 MED ORDER — GABAPENTIN 300 MG PO CAPS
300.0000 mg | ORAL_CAPSULE | Freq: Once | ORAL | Status: AC
  Administered 2018-05-18: 300 mg via ORAL
  Filled 2018-05-18: qty 1

## 2018-05-18 SURGICAL SUPPLY — 76 items
ADAPTER BOLT FEMORAL +2/-2 (Knees) ×2 IMPLANT
ADPR FEM +2/-2 OFST BOLT (Knees) ×1 IMPLANT
ADPR FEM 5D STRL KN PFC SGM (Orthopedic Implant) ×1 IMPLANT
AUG FEM SZ2.5 4 CMB POST STRL (Knees) ×2 IMPLANT
AUG FEM SZ2.5 4STRL LF KN LT (Knees) ×1 IMPLANT
AUG FEM SZ2.5 8 STRL LF KN LT (Knees) ×1 IMPLANT
AUG TIB SZ2.5 10 REV STP WDG (Knees) ×2 IMPLANT
AUGMENT DIST PFC 4MM (Knees) IMPLANT
BAG DECANTER FOR FLEXI CONT (MISCELLANEOUS) ×3 IMPLANT
BAG SPEC THK2 15X12 ZIP CLS (MISCELLANEOUS)
BAG ZIPLOCK 12X15 (MISCELLANEOUS) IMPLANT
BANDAGE ACE 6X5 VEL STRL LF (GAUZE/BANDAGES/DRESSINGS) ×3 IMPLANT
BLADE SAG 18X100X1.27 (BLADE) ×3 IMPLANT
BLADE SAW SGTL 11.0X1.19X90.0M (BLADE) ×3 IMPLANT
BONE CEMENT GENTAMICIN (Cement) ×9 IMPLANT
CEMENT BONE GENTAMICIN 40 (Cement) ×3 IMPLANT
CEMENT RESTRICTOR DEPUY SZ 4 (Cement) ×2 IMPLANT
CLOSURE WOUND 1/2 X4 (GAUZE/BANDAGES/DRESSINGS) ×2
CLOTH BEACON ORANGE TIMEOUT ST (SAFETY) ×3 IMPLANT
COVER SURGICAL LIGHT HANDLE (MISCELLANEOUS) ×3 IMPLANT
CUFF TOURN SGL QUICK 34 (TOURNIQUET CUFF) ×3
CUFF TRNQT CYL 34X4X40X1 (TOURNIQUET CUFF) ×1 IMPLANT
DECANTER SPIKE VIAL GLASS SM (MISCELLANEOUS) ×2 IMPLANT
DISAL AUG PFC 4MM (Knees) ×3 IMPLANT
DRAPE U-SHAPE 47X51 STRL (DRAPES) ×3 IMPLANT
DRSG ADAPTIC 3X8 NADH LF (GAUZE/BANDAGES/DRESSINGS) ×3 IMPLANT
DRSG TEGADERM 4X4.75 (GAUZE/BANDAGES/DRESSINGS) ×2 IMPLANT
DURAPREP 26ML APPLICATOR (WOUND CARE) ×3 IMPLANT
ELECT REM PT RETURN 15FT ADLT (MISCELLANEOUS) ×3 IMPLANT
EVACUATOR 1/8 PVC DRAIN (DRAIN) ×3 IMPLANT
FEMORAL ADAPTER (Orthopedic Implant) ×2 IMPLANT
FEMORAL PFC TC3 (Orthopedic Implant) ×2 IMPLANT
GAUZE SPONGE 4X4 12PLY STRL (GAUZE/BANDAGES/DRESSINGS) ×3 IMPLANT
GLOVE BIO SURGEON STRL SZ7 (GLOVE) ×3 IMPLANT
GLOVE BIO SURGEON STRL SZ8 (GLOVE) ×5 IMPLANT
GLOVE BIOGEL PI IND STRL 7.0 (GLOVE) ×1 IMPLANT
GLOVE BIOGEL PI IND STRL 7.5 (GLOVE) IMPLANT
GLOVE BIOGEL PI IND STRL 8 (GLOVE) ×1 IMPLANT
GLOVE BIOGEL PI INDICATOR 7.0 (GLOVE) ×8
GLOVE BIOGEL PI INDICATOR 7.5 (GLOVE) ×4
GLOVE BIOGEL PI INDICATOR 8 (GLOVE) ×2
GLOVE SURG SS PI 7.0 STRL IVOR (GLOVE) ×2 IMPLANT
GOWN STRL REUS W/TWL LRG LVL3 (GOWN DISPOSABLE) ×5 IMPLANT
GOWN STRL REUS W/TWL XL LVL3 (GOWN DISPOSABLE) ×5 IMPLANT
HANDPIECE INTERPULSE COAX TIP (DISPOSABLE) ×3
HOLDER FOLEY CATH W/STRAP (MISCELLANEOUS) ×2 IMPLANT
IMMOBILIZER KNEE 20 (SOFTGOODS) ×3
IMMOBILIZER KNEE 20 THIGH 36 (SOFTGOODS) ×1 IMPLANT
INSERT TC3 RP TIBIAL 2.5 20MM (Knees) ×2 IMPLANT
MANIFOLD NEPTUNE II (INSTRUMENTS) ×3 IMPLANT
PACK TOTAL KNEE CUSTOM (KITS) ×3 IMPLANT
PAD ABD 8X10 STRL (GAUZE/BANDAGES/DRESSINGS) ×2 IMPLANT
PADDING CAST COTTON 6X4 STRL (CAST SUPPLIES) ×6 IMPLANT
POSITIONER SURGICAL ARM (MISCELLANEOUS) ×3 IMPLANT
POST AUG PFC 4MM SZ 2.5 (Knees) ×4 IMPLANT
POST AUG PFC SIGMA 8MM 2.5 LT (Knees) ×2 IMPLANT
SET HNDPC FAN SPRY TIP SCT (DISPOSABLE) ×1 IMPLANT
STEM TIBIA PFC 13X30MM (Stem) ×2 IMPLANT
STEM UNIVERSAL REVISION 75X16 (Stem) ×2 IMPLANT
STRIP CLOSURE SKIN 1/2X4 (GAUZE/BANDAGES/DRESSINGS) ×3 IMPLANT
SUT MNCRL AB 4-0 PS2 18 (SUTURE) ×3 IMPLANT
SUT STRATAFIX 0 PDS 27 VIOLET (SUTURE) ×3
SUT VIC AB 2-0 CT1 27 (SUTURE) ×9
SUT VIC AB 2-0 CT1 TAPERPNT 27 (SUTURE) ×3 IMPLANT
SUTURE STRATFX 0 PDS 27 VIOLET (SUTURE) ×1 IMPLANT
SWAB COLLECTION DEVICE MRSA (MISCELLANEOUS) IMPLANT
SWAB CULTURE ESWAB REG 1ML (MISCELLANEOUS) IMPLANT
SYR 50ML LL SCALE MARK (SYRINGE) ×6 IMPLANT
TOWER CARTRIDGE SMART MIX (DISPOSABLE) ×3 IMPLANT
TRAY FOLEY MTR SLVR 14FR STAT (SET/KITS/TRAYS/PACK) ×2 IMPLANT
TRAY REVISION SZ 2.5 (Knees) ×2 IMPLANT
TRAY SLEEVE CEM ML (Knees) ×2 IMPLANT
TUBE KAMVAC SUCTION (TUBING) IMPLANT
WEDGE STEP 2.5 10MM (Knees) ×4 IMPLANT
WRAP KNEE MAXI GEL POST OP (GAUZE/BANDAGES/DRESSINGS) ×4 IMPLANT
YANKAUER SUCT BULB TIP 10FT TU (MISCELLANEOUS) ×2 IMPLANT

## 2018-05-18 NOTE — Anesthesia Procedure Notes (Addendum)
Anesthesia Regional Block: Adductor canal block   Pre-Anesthetic Checklist: ,, timeout performed, Correct Patient, Correct Site, Correct Laterality, Correct Procedure, Correct Position, site marked, Risks and benefits discussed, Surgical consent,  Pre-op evaluation,  At surgeon's request  Laterality: Left  Prep: chloraprep       Needles:  Injection technique: Single-shot  Needle Type: Stimulator Needle - 80          Additional Needles:   Procedures: Doppler guided,,,, ultrasound used (permanent image in chart),,,,  Narrative:  Start time: 05/18/2018 11:55 AM End time: 05/18/2018 12:10 PM Injection made incrementally with aspirations every 5 mL.  Performed by: Personally  Anesthesiologist: Belinda Block, MD

## 2018-05-18 NOTE — Brief Op Note (Signed)
05/18/2018  2:54 PM  PATIENT:  Maureen [name]  62 y.o. female  PRE-OPERATIVE DIAGNOSIS:  failed left total knee arthroplasty  POST-OPERATIVE DIAGNOSIS:  failed left total knee arthroplasty  PROCEDURE:  Procedure(s) with comments: Left total knee arthroplasty revision (Left) - Adductor Block  SURGEON:  Surgeon(s) and Role:    Gaynelle Arabian, MD - Primary  PHYSICIAN ASSISTANT:   ASSISTANTS: Theresa Duty, PA-C   ANESTHESIA:   Adductor canal block and spinal  EBL:  150 mL   BLOOD ADMINISTERED:none  DRAINS: (Medium) Hemovact drain(s) in the left knee with  Suction Open   LOCAL MEDICATIONS USED:  OTHER Exparel  COUNTS:  YES  TOURNIQUET:   Total Tourniquet Time Documented: Thigh (Left) - 62 minutes Thigh (Left) - 22 minutes Total: Thigh (Left) - 84 minutes   DICTATION: .Other Dictation: Dictation Number [id number]  PLAN OF CARE: Admit to inpatient   PATIENT DISPOSITION:  PACU - hemodynamically stable.

## 2018-05-18 NOTE — Transfer of Care (Signed)
Immediate Anesthesia Transfer of Care Note  Patient: Maureen [name]  Procedure(s) Performed: Left total knee arthroplasty revision (Left Knee)  Patient Location: PACU  Anesthesia Type:Spinal  Level of Consciousness: awake, alert , oriented and patient cooperative  Airway & Oxygen Therapy: Patient Spontanous Breathing and Patient connected to face mask oxygen  Post-op Assessment: Report given to RN, Post -op Vital signs reviewed and stable and Patient moving all extremities  Post vital signs: Reviewed and stable  Last Vitals:  Vitals Value Taken Time  BP 129/75 05/18/2018  3:21 PM  Temp    Pulse 83 05/18/2018  3:22 PM  Resp 11 05/18/2018  3:22 PM  SpO2 100 % 05/18/2018  3:22 PM  Vitals shown include unvalidated device data.  Last Pain:  Vitals:   05/18/18 1011  TempSrc: Oral  PainSc: 4          Complications: No apparent anesthesia complications

## 2018-05-18 NOTE — Anesthesia Preprocedure Evaluation (Signed)
Anesthesia Evaluation  Patient identified by MRN, date of birth, ID band Patient awake    History of Anesthesia Complications (+) PONV  Airway Mallampati: II  TM Distance: >3 FB     Dental   Pulmonary former smoker,    breath sounds clear to auscultation       Cardiovascular hypertension,  Rhythm:Regular Rate:Normal     Neuro/Psych    GI/Hepatic Neg liver ROS, hiatal hernia, GERD  ,  Endo/Other  negative endocrine ROS  Renal/GU negative Renal ROS     Musculoskeletal   Abdominal   Peds  Hematology   Anesthesia Other Findings   Reproductive/Obstetrics                             Anesthesia Physical Anesthesia Plan  ASA: III  Anesthesia Plan: Spinal   Post-op Pain Management:  Regional for Post-op pain   Induction: Intravenous  PONV Risk Score and Plan: Ondansetron, Dexamethasone and Midazolam  Airway Management Planned: Simple Face Mask and Nasal Cannula  Additional Equipment:   Intra-op Plan:   Post-operative Plan:   Informed Consent: I have reviewed the patients History and Physical, chart, labs and discussed the procedure including the risks, benefits and alternatives for the proposed anesthesia with the patient or authorized representative who has indicated his/her understanding and acceptance.   Dental advisory given  Plan Discussed with: CRNA and Anesthesiologist  Anesthesia Plan Comments:         Anesthesia Quick Evaluation

## 2018-05-18 NOTE — Progress Notes (Signed)
Assisted  Dr. Nyoka Cowden with left knee adductor canal block. Side rails up, monitors on throughout procedure. See vital signs in flow sheet. Tolerated Procedure well.

## 2018-05-18 NOTE — Anesthesia Postprocedure Evaluation (Signed)
Anesthesia Post Note  Patient: Maureen [name]  Procedure(s) Performed: Left total knee arthroplasty revision (Left Knee)     Patient location during evaluation: PACU Anesthesia Type: Spinal Level of consciousness: awake Pain management: pain level controlled Vital Signs Assessment: post-procedure vital signs reviewed and stable Respiratory status: spontaneous breathing Postop Assessment: no apparent nausea or vomiting Anesthetic complications: no    Last Vitals:  Vitals:   05/18/18 1226 05/18/18 1227  BP:    Pulse: 77 77  Resp: 12 13  Temp:    SpO2: 100% 100%    Last Pain:  Vitals:   05/18/18 1011  TempSrc: Oral  PainSc: 4                  Zyan Coby

## 2018-05-18 NOTE — Op Note (Signed)
NAME: Maureen [name], Maureen [name] MEDICAL RECORD JO:[id number] ACCOUNT [id number] DATE OF BIRTH:1956/05/07 FACILITY: [facility] LOCATION: [facility] PHYSICIAN:Jeremaih Klima Zella Ball, MD  OPERATIVE REPORT  DATE OF PROCEDURE:  05/18/2018  PREOPERATIVE DIAGNOSIS:  Failed left total knee arthroplasty.  POSTOPERATIVE DIAGNOSIS:  Failed left total knee arthroplasty.  PROCEDURE:  Left total knee arthroplasty revision.  SURGEON:   Dione Plover. Steel Kerney, MD  ASSISTANT:  Ladene Artist, PA-C  ANESTHESIA:  Adductor canal block and spinal.  ESTIMATED BLOOD LOSS:  150  DRAINS:  Hemovac x1.  TOURNIQUET TIME:  Up 61 minutes at 300 mmHg, down 8 minutes, up additional 23 minutes to 300 mmHg.  COMPLICATIONS:  None.  CONDITION:  Stable to recovery.  BRIEF CLINICAL NOTE:  The patient is a 62 year old female who underwent a total knee arthroplasty a couple of years ago at an outside facility.  She has had an unstable feeling to the knee and has a significant valgus deformity.  She has significant pain  also.  She presents now for total knee arthroplasty revision.  PROCEDURE IN DETAIL:  After successful administration of adductor canal block and spinal anesthetic, a tourniquet was placed high on her left thigh and her left lower extremity is prepped and draped in the usual sterile fashion.  Extremity was wrapped  with an Esmarch and tourniquet inflated to 300 mmHg.  Midline incision was made with a 10 blade through subcutaneous tissue to the level of the extensor mechanism.  A fresh blade was used to make a medial parapatellar arthrotomy.  Minimal fluid was  encountered.  Soft tissue over the proximal medial tibia subperiosteally elevated to the joint line with a knife and into the semimembranosus bursa with a Cobb elevator.  Soft tissue laterally was elevated with attention being paid to avoid the patellar  tendon on the tibial tubercle.  Patella was everted, knee flexed 90 degrees and we then started removing scar tissue from  the suprapatellar pouch and medial and lateral gutters.  I was then able to remove the tibial polyethylene from the tibial tray.  The tibia was then subluxed forward and circumferential retraction is obtained.  I then disrupted the interface between the tibial component and bone with an oscillating saw.  We eventually got to the point where I was able to extract the tibial  component with minimal to no bone loss.  We then removed the cement from the tibial canal.  Reamed the tibial canal up to 13 mm for a 13 mm press-fit cemented stem.  The tibial size is  2.5.  The proximal tibia was prepared with the modular drill the  modular drill plus stem extension for the size 2.5.  I then prepared proximally for a 29 mm sleeve for rotational control.  The femur was then addressed.  The femoral component was disrupted from the bone by disrupting the interface between the component and bone with osteotomes.  The component was removed with minimal to no bone loss.  There was insignificant valgus.  I then  removed the cement.  We then placed a cement restrictor in the tibial canal, which was a size 4.  We reamed the femur up to 16 mm, which had a great press fit.  The reamer was left in place to serve as our intramedullary cutting guide.  The distal  femoral cutting block is placed and removed approximately 6 mm from the medial side and about 2 mm from the lateral side to get to a flat surface.  This is in 5 degrees  of valgus.  The size is a 2.5.  The 2.5 cutting block was placed and rotation was  marked utilizing the epicondylar axis.  A spacer block was also placed in flexion for a rectangular flexion gap.  Block was pinned in this position.  We did not remove any bone posterior chamfer.  On anterior chamfer there was a small amount of bone  removed posteriorly.  We needed a 4 mm augments both medial and lateral posteriorly.  The intercondylar block was then placed and that cut is made for the TC3  component.  Trials were then placed on the tibial side,  It is a size 2.5 MBT revision tray with a 29 mm sleeve, 10 mm augments medial and lateral.  A 13 x 30 stem extension.  The tibial component is excellent fit.  On the femoral side, size 2.5 TC3 femur with a 16  x 75 stem and a 5 degree left valgus position in the plus 2, effectively raising the stem and lowering the anterior flange of the prosthesis down to the anterior cortex of the femur.  I placed 4 mm augment distal medial, 8 mm augment distal lateral and  posteriorly.  We placed 4 mm augments medial and lateral.  With this construct and a 17.5 mm spacer.  Full extension was achieved with excellent varus varus/valgus and anterior/posterior balance throughout full range of motion.  Patella was everted and  was covered with tissue and I removed the tissue, essentially performed a patelloplasty.  The patella was then reduced and it tracks normally.  I released the tourniquet for 8 minutes to check for any bleeding and all minor bleeding was stopped with  electrocautery.  During that 8 minutes, the components were assembled on the back table.  The cut bone surfaces were then prepared with pulsatile lavage.  Cement was mixed and once ready for implantation, the tibial component was cemented into place and  all extruded cement removed.  Femoral component cemented distally with a press fit proximally.  The trial 17.5 mm insert was placed.  Knee held in full extension.  All extruded cement removed.  When the cement is fully hardened, then we did trial again  with a 20 and that was more appropriate.  The 20 mm TC3 rotating platform insert was then placed into the tibial tray.  The knee was reduced with outstanding stability throughout full range of motion.  Patella tracks normally.  Twenty mL of Exparel mixed  with 60 mL of saline was then injected into the posterior capsule, extensor mechanism and subcutaneous tissue.  The arthrotomy was then closed over a  Hemovac drain with a running 0 Stratafix suture.  Flexion against gravity was about 120 degrees and the  patella tracks normally.   Tourniquet was then released for a second tourniquet time of 21 minutes.  Subcu was closed with interrupted 2-0 Vicryl and skin and subcuticular closed with running 4-0 Monocryl.  The drains were hooked to suction.  Incision  cleaned and dried and a bulky sterile dressing applied.  She was then awakened and transported to recovery in stable condition.  Note that a surgical assistant was a medical necessity for this procedure to do it in a safe and expeditious manner.  Surgical assistance necessary for retraction of vital ligaments and neurovascular structures and for proper positioning of the limb for  safe removal of the old implant and safe and effective placement of the new implant.  AN/NUANCE  D:05/18/2018 T:05/18/2018 JOB:[id number]

## 2018-05-18 NOTE — Discharge Instructions (Signed)

## 2018-05-18 NOTE — Anesthesia Procedure Notes (Signed)
Spinal  Patient location during procedure: OR End time: 05/18/2018 12:46 PM Staffing Resident/CRNA: Caryl Pina T, CRNA Performed: resident/CRNA  Preanesthetic Checklist Completed: patient identified, site marked, surgical consent, pre-op evaluation, timeout performed, IV checked, risks and benefits discussed and monitors and equipment checked Spinal Block Patient position: sitting Prep: DuraPrep Patient monitoring: heart rate, cardiac monitor, continuous pulse ox and blood pressure Approach: midline Location: L4-5 Injection technique: single-shot Needle Needle type: Pencan  Needle gauge: 24 G Needle length: 9 cm Assessment Sensory level: T4 Additional Notes Expiration date of kit checked and confirmed. Patient tolerated procedure well, without complications.

## 2018-05-18 NOTE — Interval H&P Note (Signed)
History and Physical Interval Note:  05/18/2018 10:05 AM  Maureen [name]  has presented today for surgery, with the diagnosis of failed left total knee arthroplasty  The various methods of treatment have been discussed with the patient and family. After consideration of risks, benefits and other options for treatment, the patient has consented to  Procedure(s): Left total knee arthroplasty revision (Left) as a surgical intervention .  [name] history has been reviewed, patient examined, no change in status, stable for surgery.  I have reviewed the patient's chart and labs.  Questions were answered to the patient's satisfaction.     Pilar Plate Bennye Nix

## 2018-05-19 ENCOUNTER — Encounter (HOSPITAL_COMMUNITY): Payer: Self-pay | Admitting: Orthopedic Surgery

## 2018-05-19 LAB — CBC
HCT: 39 % (ref 36.0–46.0)
Hemoglobin: 12.8 g/dL (ref 12.0–15.0)
MCH: 32.9 pg (ref 26.0–34.0)
MCHC: 32.8 g/dL (ref 30.0–36.0)
MCV: 100.3 fL — ABNORMAL HIGH (ref 78.0–100.0)
PLATELETS: 287 10*3/uL (ref 150–400)
RBC: 3.89 MIL/uL (ref 3.87–5.11)
RDW: 13.5 % (ref 11.5–15.5)
WBC: 13.5 10*3/uL — AB (ref 4.0–10.5)

## 2018-05-19 LAB — BASIC METABOLIC PANEL
Anion gap: 9 (ref 5–15)
BUN: 14 mg/dL (ref 8–23)
CALCIUM: 8.9 mg/dL (ref 8.9–10.3)
CO2: 26 mmol/L (ref 22–32)
Chloride: 102 mmol/L (ref 98–111)
Creatinine, Ser: 0.79 mg/dL (ref 0.44–1.00)
GFR calc Af Amer: >60 mL/min (ref >60)
Glucose, Bld: 145 mg/dL — ABNORMAL HIGH (ref 70–99)
POTASSIUM: 4.8 mmol/L (ref 3.5–5.1)
Sodium: 137 mmol/L (ref 135–145)

## 2018-05-19 MED ORDER — HYDROMORPHONE HCL 1 MG/ML IJ SOLN
1.0000 mg | INTRAMUSCULAR | Status: DC | PRN
  Administered 2018-05-19: 1 mg via INTRAVENOUS
  Filled 2018-05-19: qty 1

## 2018-05-19 MED ORDER — HYDROMORPHONE HCL 1 MG/ML IJ SOLN
1.0000 mg | Freq: Once | INTRAMUSCULAR | Status: AC
  Administered 2018-05-19: 1 mg via INTRAVENOUS
  Filled 2018-05-19: qty 1

## 2018-05-19 NOTE — Progress Notes (Addendum)
   Subjective: 1 Day Post-Op Procedure(s) (LRB): Left total knee arthroplasty revision (Left) Patient reports pain as moderate.   Patient seen in rounds by Dr. Wynelle Link. Patient is well, and has had no acute complaints or problems other than pain in the left knee. Patient did well overnight, foley catheter removed. No CP, SHOB, or calf tenderness.  We will start therapy today.   Objective: Vital signs in last 24 hours: Temp:  [97.3 F (36.3 C)-98.9 F (37.2 C)] 98.1 F (36.7 C) (09/19 0457) Pulse Rate:  [68-95] 90 (09/19 0457) Resp:  [11-19] 16 (09/19 0457) BP: (129-171)/(70-102) 147/80 (09/19 0457) SpO2:  [94 %-100 %] 94 % (09/19 0457) Weight:  [64.1 kg-106.1 kg] 106.1 kg (09/18 1225)  Intake/Output from previous day:  Intake/Output Summary (Last 24 hours) at 05/19/2018 0747 Last data filed at 05/19/2018 0700 Gross per 24 hour  Intake 4150.85 ml  Output 2470 ml  Net 1680.85 ml     Labs: Recent Labs    05/19/18 0430  HGB 12.8   Recent Labs    05/19/18 0430  WBC 13.5*  RBC 3.89  HCT 39.0  PLT 287   Recent Labs    05/19/18 0430  NA 137  K 4.8  CL 102  CO2 26  BUN 14  CREATININE 0.79  GLUCOSE 145*  CALCIUM 8.9   No results for input(s): LABPT, INR in the last 72 hours.  Exam: General - Patient is Alert and Oriented Extremity - Neurologically intact Neurovascular intact Sensation intact distally Dorsiflexion/Plantar flexion intact Dressing - dressing C/D/I Motor Function - intact, moving foot and toes well on exam.   Past Medical History:  Diagnosis Date  . Arthritis    LEFT KNEE, LOWER BACK  . GERD (gastroesophageal reflux disease)   . History of hiatal hernia   . History of obstructive sleep apnea    per pt history osa w/ cpap until wt loss after gastric band, retested negative for sleep apnea  . Hypertension   . IBS (irritable bowel syndrome)   . Numbness    BOTTOM LEFT FOOT  . PONV (postoperative nausea and vomiting)    PONV with  gallbladder   . Seasonal allergies   . Sinus drainage   . Snores    WEARS NIGHT GUARD  . SUI (stress urinary incontinence, female)     Assessment/Plan: 1 Day Post-Op Procedure(s) (LRB): Left total knee arthroplasty revision (Left) Principal Problem:   Failed total knee arthroplasty ([place])  Estimated body mass index is 44.21 kg/m as calculated from the following:   Height as of this encounter: 5\' 1"  (1.549 m).   Weight as of this encounter: 106.1 kg. Advance diet Up with therapy  DVT Prophylaxis - Aspirin Weight bearing as tolerated. D/C O2 and pulse ox and try on room air. Hemovac drains pulled without difficulty, will begin therapy today.  Plan is to go Home after hospital stay. Possible discharge tomorrow if progresses with therapy and meeting her goals.   Theresa Duty, PA-C Orthopedic Surgery 05/19/2018, 7:47 AM

## 2018-05-19 NOTE — Evaluation (Signed)
Physical Therapy Evaluation Patient Details Name: Maureen [name] MRN: [id number] DOB: [birth year] Today's Date: 05/19/2018   History of Present Illness  62 y.o. female admitted for L TKA revision. PMH of L and R TKA.   Clinical Impression  Pt is s/p TKA resulting in the deficits listed below (see PT Problem List). Pt ambulated 82' with RW and performed TKA exercises with min assist. Good progress expected.  Pt will benefit from skilled PT to increase their independence and safety with mobility to allow discharge to the venue listed below.      Follow Up Recommendations Outpatient PT;Follow surgeon's recommendation for DC plan and follow-up therapies(pt stated she has outpt PT appointment on Tuesday next week)    Equipment Recommendations  None recommended by PT    Recommendations for Other Services       Precautions / Restrictions Precautions Precautions: Fall;Knee Precaution Booklet Issued: Yes (comment) Precaution Comments: 2-3 falls in past 1 year from "slipping" Restrictions Weight Bearing Restrictions: No Other Position/Activity Restrictions: WBAT      Mobility  Bed Mobility               General bed mobility comments: up on commode  Transfers Overall transfer level: Needs assistance Equipment used: Rolling walker (2 wheeled) Transfers: Sit to/from Stand Sit to Stand: Supervision         General transfer comment: VCs hand placement  Ambulation/Gait Ambulation/Gait assistance: Min guard Gait Distance (Feet): 90 Feet Assistive device: Rolling walker (2 wheeled) Gait Pattern/deviations: Step-to pattern;Decreased stride length Gait velocity: decr   General Gait Details: pt ambulated with L KI, no loss of balance, good sequencing, distance limited by knee pain  Stairs            Wheelchair Mobility    Modified Rankin (Stroke Patients Only)       Balance Overall balance assessment: Modified Independent                                            Pertinent Vitals/Pain Pain Assessment: 0-10 Pain Score: 7  Pain Location: L knee Pain Descriptors / Indicators: Sore Pain Intervention(s): Limited activity within patient's tolerance;Monitored during session;Ice applied;Premedicated before session    [place] expects to be discharged to:: Private residence Living Arrangements: Spouse/significant other Available Help at Discharge: Family Type of Home: House Home Access: Stairs to enter Entrance Stairs-Rails: None Entrance Stairs-Number of Steps: 3 Home Layout: Able to live on main level with bedroom/bathroom Home Equipment: Walker - 2 wheels;Bedside commode;Cane - single point;Adaptive equipment Additional Comments: leg lifter    Prior Function Level of Independence: Independent               Hand Dominance   Dominant Hand: Right    Extremity/Trunk Assessment   Upper Extremity Assessment Upper Extremity Assessment: Overall WFL for tasks assessed    Lower Extremity Assessment Lower Extremity Assessment: LLE deficits/detail LLE Deficits / Details: 5-35* AAROM L knee, -3/5 SLR LLE Sensation: decreased light touch(numbness bottom of L foot at baseline, pt reports new tingling L lateral foot)    Cervical / Trunk Assessment Cervical / Trunk Assessment: Normal  Communication   Communication: No difficulties  Cognition Arousal/Alertness: Awake/alert Behavior During Therapy: WFL for tasks assessed/performed Overall Cognitive Status: Within Functional Limits for tasks assessed  General Comments      Exercises Total Joint Exercises Ankle Circles/Pumps: AROM;Both;15 reps;Supine Quad Sets: AROM;Both;5 reps;Supine Short Arc Quad: AROM;Left;10 reps;Supine Heel Slides: AAROM;Left;10 reps;Supine Hip ABduction/ADduction: AROM;Left;10 reps;Supine Straight Leg Raises: AROM;AAROM;Left;10 reps;Supine Goniometric ROM: 5-35* AAROM L  knee   Assessment/Plan    PT Assessment Patient needs continued PT services  PT Problem List Decreased strength;Decreased range of motion;Decreased activity tolerance;Decreased mobility;Pain;Decreased knowledge of use of DME       PT Treatment Interventions DME instruction;Gait training;Stair training;Therapeutic exercise;Therapeutic activities;Functional mobility training;Patient/family education    PT Goals (Current goals can be found in the Care Plan section)  Acute Rehab PT Goals Patient Stated Goal: to walk farther, go up/down stairs PT Goal Formulation: With patient Time For Goal Achievement: 05/26/18 Potential to Achieve Goals: Good    Frequency 7X/week   Barriers to discharge        Co-evaluation               AM-PAC PT "6 Clicks" Daily Activity  Outcome Measure Difficulty turning over in bed (including adjusting bedclothes, sheets and blankets)?: Unable Difficulty moving from lying on back to sitting on the side of the bed? : Unable Difficulty sitting down on and standing up from a chair with arms (e.g., wheelchair, bedside commode, etc,.)?: A Little Help needed moving to and from a bed to chair (including a wheelchair)?: A Little Help needed walking in hospital room?: A Little Help needed climbing 3-5 steps with a railing? : A Lot 6 Click Score: 13    End of Session Equipment Utilized During Treatment: Gait belt Activity Tolerance: Patient tolerated treatment well;Patient limited by pain Patient left: in chair;with call bell/phone within reach Nurse Communication: Mobility status PT Visit Diagnosis: Difficulty in walking, not elsewhere classified (R26.2);Muscle weakness (generalized) (M62.81);Pain Pain - Right/Left: Left Pain - part of body: Knee    Time: 3568-6168 PT Time Calculation (min) (ACUTE ONLY): 27 min   Charges:     PT Treatments $Gait Training: 8-22 mins        Blondell Reveal Kistler PT 05/19/2018  Acute Rehabilitation  Services Pager [phone] Office [phone]

## 2018-05-19 NOTE — Progress Notes (Signed)
Physical Therapy Treatment Patient Details Name: Maureen [name] MRN: [id number] DOB: August 31, 1956 Today's Date: 05/19/2018    History of Present Illness 62 y.o. female admitted for L TKA revision. PMH of L and R TKA.     PT Comments    Pt is progressing well with mobility, she ambulated 35' with RW and performed TKA exercises. She is independent with SLR so DCed KI, no buckling of LLE with ambulation. Will plan to do stair training tomorrow morning, then expect she'll be ready to DC home from PT standpoint.    Follow Up Recommendations  Outpatient PT;Follow surgeon's recommendation for DC plan and follow-up therapies(pt stated she has outpt PT appointment on Tuesday next week)     Equipment Recommendations  None recommended by PT    Recommendations for Other Services       Precautions / Restrictions Precautions Precautions: Fall;Knee Precaution Booklet Issued: Yes (comment) Precaution Comments: 2-3 falls in past 1 year from "slipping"; reviewed no pillow under knee Restrictions Weight Bearing Restrictions: No Other Position/Activity Restrictions: WBAT    Mobility  Bed Mobility Overal bed mobility: Needs Assistance Bed Mobility: Sit to Supine       Sit to supine: Min assist   General bed mobility comments: min A for LLE  Transfers Overall transfer level: Needs assistance Equipment used: Rolling walker (2 wheeled) Transfers: Sit to/from Stand Sit to Stand: Supervision         General transfer comment: VCs hand placement  Ambulation/Gait Ambulation/Gait assistance: Min guard Gait Distance (Feet): 90 Feet Assistive device: Rolling walker (2 wheeled) Gait Pattern/deviations: Step-to pattern;Decreased stride length Gait velocity: decr   General Gait Details: pt ambulated without L KI, no buckling of knee, no loss of balance, good sequencing, distance limited by knee pain   Stairs             Wheelchair Mobility    Modified Rankin (Stroke Patients  Only)       Balance Overall balance assessment: Modified Independent                                          Cognition Arousal/Alertness: Awake/alert Behavior During Therapy: WFL for tasks assessed/performed Overall Cognitive Status: Within Functional Limits for tasks assessed                                        Exercises Total Joint Exercises Ankle Circles/Pumps: AROM;Both;15 reps;Supine Quad Sets: AROM;Both;5 reps;Supine Short Arc Quad: AROM;Left;10 reps;Supine Heel Slides: AAROM;Left;10 reps;Supine Hip ABduction/ADduction: AROM;Left;10 reps;Supine Straight Leg Raises: AROM;AAROM;Left;10 reps;Supine Long Arc Quad: AROM;Left;10 reps;Seated Knee Flexion: AROM;AAROM;Left;10 reps;Seated Goniometric ROM: 5-40* AAROM L knee    General Comments        Pertinent Vitals/Pain Pain Assessment: 0-10 Pain Score: 7  Pain Location: L knee Pain Descriptors / Indicators: Sore Pain Intervention(s): Limited activity within patient's tolerance;Monitored during session;Premedicated before session;Ice applied    Home Living Family/patient expects to be discharged to:: Private residence Living Arrangements: Spouse/significant other Available Help at Discharge: Family Type of Home: House Home Access: Stairs to enter Entrance Stairs-Rails: None Home Layout: Able to live on main level with bedroom/bathroom Home Equipment: Environmental consultant - 2 wheels;Bedside commode;Cane - single point;Adaptive equipment Additional Comments: leg lifter    Prior Function Level of Independence: Independent  PT Goals (current goals can now be found in the care plan section) Acute Rehab PT Goals Patient Stated Goal: to walk farther, go up/down stairs PT Goal Formulation: With patient Time For Goal Achievement: 05/26/18 Potential to Achieve Goals: Good Progress towards PT goals: Progressing toward goals    Frequency    7X/week      PT Plan Current plan  remains appropriate    Co-evaluation              AM-PAC PT "6 Clicks" Daily Activity  Outcome Measure  Difficulty turning over in bed (including adjusting bedclothes, sheets and blankets)?: Unable Difficulty moving from lying on back to sitting on the side of the bed? : Unable Difficulty sitting down on and standing up from a chair with arms (e.g., wheelchair, bedside commode, etc,.)?: A Little Help needed moving to and from a bed to chair (including a wheelchair)?: A Little Help needed walking in hospital room?: A Little Help needed climbing 3-5 steps with a railing? : A Lot 6 Click Score: 13    End of Session Equipment Utilized During Treatment: Gait belt Activity Tolerance: Patient tolerated treatment well;Patient limited by pain Patient left: with call bell/phone within reach;in bed Nurse Communication: Mobility status PT Visit Diagnosis: Difficulty in walking, not elsewhere classified (R26.2);Muscle weakness (generalized) (M62.81);Pain Pain - Right/Left: Left Pain - part of body: Knee     Time: 7121-9758 PT Time Calculation (min) (ACUTE ONLY): 18 min  Charges:  $Gait Training: 8-22 mins $Therapeutic Exercise: 8-22 mins                     Blondell Reveal Kistler PT 05/19/2018  Acute Rehabilitation Services Pager [phone] Office [phone]

## 2018-05-19 NOTE — Care Management Note (Signed)
Case Management Note  Patient Details  Name: Maureen [name] MRN: [id number] Date of Birth: 1955-10-29  Subjective/Objective:     Spoke with patient at bedside. Confirmed plan for OP PT, already arranged. Has RW and 3n1. [phone]               Action/Plan:   Expected Discharge Date:                  Expected Discharge Plan:  OP Rehab  In-House Referral:  NA  Discharge planning Services  CM Consult  Post Acute Care Choice:  NA Choice offered to:  Patient  DME Arranged:  N/A DME Agency:  NA  HH Arranged:  NA HH Agency:  NA  Status of Service:  Completed, signed off  If discussed at [place] of Stay Meetings, dates discussed:    Additional Comments:  Guadalupe Maple, RN 05/19/2018, 11:55 AM

## 2018-05-20 LAB — CBC
HCT: 37.1 % (ref 36.0–46.0)
Hemoglobin: 12.1 g/dL (ref 12.0–15.0)
MCH: 32.7 pg (ref 26.0–34.0)
MCHC: 32.6 g/dL (ref 30.0–36.0)
MCV: 100.3 fL — ABNORMAL HIGH (ref 78.0–100.0)
PLATELETS: 262 10*3/uL (ref 150–400)
RBC: 3.7 MIL/uL — AB (ref 3.87–5.11)
RDW: 13.5 % (ref 11.5–15.5)
WBC: 12 10*3/uL — ABNORMAL HIGH (ref 4.0–10.5)

## 2018-05-20 LAB — BASIC METABOLIC PANEL
ANION GAP: 9 (ref 5–15)
BUN: 19 mg/dL (ref 8–23)
CALCIUM: 9.4 mg/dL (ref 8.9–10.3)
CHLORIDE: 102 mmol/L (ref 98–111)
CO2: 30 mmol/L (ref 22–32)
CREATININE: 0.66 mg/dL (ref 0.44–1.00)
GFR calc non Af Amer: >60 mL/min (ref >60)
Glucose, Bld: 119 mg/dL — ABNORMAL HIGH (ref 70–99)
Potassium: 4.3 mmol/L (ref 3.5–5.1)
SODIUM: 141 mmol/L (ref 135–145)

## 2018-05-20 MED ORDER — METHOCARBAMOL 500 MG PO TABS: 500.0000 mg | ORAL_TABLET | Freq: Four times a day (QID) | ORAL | 0 refills | Status: DC | PRN

## 2018-05-20 MED ORDER — ASPIRIN 325 MG PO TBEC: 325.0000 mg | DELAYED_RELEASE_TABLET | Freq: Two times a day (BID) | ORAL | 0 refills | Status: AC

## 2018-05-20 MED ORDER — OXYCODONE HCL 5 MG PO TABS: 5.0000 mg | ORAL_TABLET | Freq: Four times a day (QID) | ORAL | 0 refills | Status: DC | PRN

## 2018-05-20 MED ORDER — HYDRALAZINE HCL 20 MG/ML IJ SOLN: 10.0000 mg | Freq: Four times a day (QID) | INTRAMUSCULAR | Status: DC | PRN

## 2018-05-20 MED ORDER — GABAPENTIN 300 MG PO CAPS: 300.0000 mg | ORAL_CAPSULE | Freq: Three times a day (TID) | ORAL | 0 refills | Status: DC

## 2018-05-20 NOTE — Progress Notes (Signed)
Physical Therapy Treatment Patient Details Name: Maureen [name] MRN: [id number] DOB: March 14, 1956 Today's Date: 05/20/2018    History of Present Illness 62 y.o. female admitted for L TKA revision. PMH of L and R TKA.     PT Comments    POD # 2  Pt dressed and eager to D/C to home.  Assisted with amb in hallway, practiced one step and performed all supine TKR TE's following handout HEP.  Instructed on proper tech, freq as well as use of ICE.   Follow Up Recommendations  Outpatient PT;Follow surgeon's recommendation for DC plan and follow-up therapies     Equipment Recommendations  None recommended by PT    Recommendations for Other Services       Precautions / Restrictions Precautions Precautions: Fall;Knee Restrictions Weight Bearing Restrictions: No Other Position/Activity Restrictions: WBAT    Mobility  Bed Mobility               General bed mobility comments: OOB in recliner  Transfers Overall transfer level: Needs assistance Equipment used: Rolling walker (2 wheeled) Transfers: Sit to/from Stand Sit to Stand: Supervision         General transfer comment: one VC safety with turns  Ambulation/Gait Ambulation/Gait assistance: Supervision Gait Distance (Feet): 110 Feet Assistive device: Rolling walker (2 wheeled) Gait Pattern/deviations: Step-to pattern;Decreased stride length     General Gait Details: tolerated increased distance and demonstarted good safety cognition   Stairs Stairs: Yes Stairs assistance: Supervision;Min guard Stair Management: No rails;Step to pattern;Forwards;With walker Number of Stairs: 1 General stair comments: one step forward with walker and 25% VC's on proper sequencing.  Performed twice.  Tolerated well.   Wheelchair Mobility    Modified Rankin (Stroke Patients Only)       Balance                                            Cognition Arousal/Alertness: Awake/alert Behavior During Therapy: WFL  for tasks assessed/performed Overall Cognitive Status: Within Functional Limits for tasks assessed                                        Exercises   Total Knee Replacement TE's 10 reps B LE ankle pumps 10 reps towel squeezes 10 reps knee presses 10 reps heel slides  10 reps SAQ's 10 reps SLR's 10 reps ABD Followed by ICE     General Comments        Pertinent Vitals/Pain Pain Assessment: 0-10 Pain Score: 3  Pain Location: L knee Pain Descriptors / Indicators: Operative site guarding;Sore Pain Intervention(s): Monitored during session;Repositioned;Ice applied;Premedicated before session    Home Living                      Prior Function            PT Goals (current goals can now be found in the care plan section) Progress towards PT goals: Progressing toward goals    Frequency    7X/week      PT Plan Current plan remains appropriate    Co-evaluation              AM-PAC PT "6 Clicks" Daily Activity  Outcome Measure  Difficulty turning over in bed (including adjusting bedclothes, sheets and  blankets)?: A Little Difficulty moving from lying on back to sitting on the side of the bed? : A Little Difficulty sitting down on and standing up from a chair with arms (e.g., wheelchair, bedside commode, etc,.)?: A Little Help needed moving to and from a bed to chair (including a wheelchair)?: A Little Help needed walking in hospital room?: A Little Help needed climbing 3-5 steps with a railing? : A Little 6 Click Score: 18    End of Session Equipment Utilized During Treatment: Gait belt Activity Tolerance: Patient tolerated treatment well;Patient limited by pain Patient left: with call bell/phone within reach;in bed Nurse Communication: (pt ready for D/C to home) PT Visit Diagnosis: Difficulty in walking, not elsewhere classified (R26.2);Muscle weakness (generalized) (M62.81);Pain Pain - Right/Left: Left Pain - part of body: Knee      Time: 1010-1038 PT Time Calculation (min) (ACUTE ONLY): 28 min  Charges:  $Gait Training: 8-22 mins $Therapeutic Exercise: 8-22 mins                     Rica Koyanagi  PTA Acute  Rehabilitation Services Pager      [phone] Office      [phone]

## 2018-05-20 NOTE — Progress Notes (Signed)
   Subjective: 2 Days Post-Op Procedure(s) (LRB): Left total knee arthroplasty revision (Left) Patient reports pain as mild.   Patient seen in rounds for Dr. Wynelle Link. Patient is well, and has had no acute complaints or problems other than discomfort in the knee. Patient states she is ready to go home today. Voiding without difficulty, positive flatus. Meeting her goals with therapy yesterday. No CP, SHOB, or calf pain.  Plan is to go Home after hospital stay.  Objective: Vital signs in last 24 hours: Temp:  [97.4 F (36.3 C)-98.6 F (37 C)] 98.2 F (36.8 C) (09/20 0601) Pulse Rate:  [84-109] 84 (09/20 0601) Resp:  [16-18] 17 (09/20 0601) BP: (143-179)/(69-100) 165/94 (09/20 0601) SpO2:  [93 %-97 %] 97 % (09/20 0601)  Intake/Output from previous day:  Intake/Output Summary (Last 24 hours) at 05/20/2018 0718 Last data filed at 05/20/2018 0600 Gross per 24 hour  Intake 1164.5 ml  Output 900 ml  Net 264.5 ml    Intake/Output this shift: No intake/output data recorded.  Labs: Recent Labs    05/19/18 0430 05/20/18 0505  HGB 12.8 12.1   Recent Labs    05/19/18 0430 05/20/18 0505  WBC 13.5* 12.0*  RBC 3.89 3.70*  HCT 39.0 37.1  PLT 287 262   Recent Labs    05/19/18 0430 05/20/18 0505  NA 137 141  K 4.8 4.3  CL 102 102  CO2 26 30  BUN 14 19  CREATININE 0.79 0.66  GLUCOSE 145* 119*  CALCIUM 8.9 9.4   Exam: General - Patient is Alert and Oriented Extremity - Neurologically intact Neurovascular intact Sensation intact distally Dorsiflexion/Plantar flexion intact Dressing/Incision - clean, dry, no drainage Motor Function - intact, moving foot and toes well on exam.   Past Medical History:  Diagnosis Date  . Arthritis    LEFT KNEE, LOWER BACK  . GERD (gastroesophageal reflux disease)   . History of hiatal hernia   . History of obstructive sleep apnea    per pt history osa w/ cpap until wt loss after gastric band, retested negative for sleep apnea  .  Hypertension   . IBS (irritable bowel syndrome)   . Numbness    BOTTOM LEFT FOOT  . PONV (postoperative nausea and vomiting)    PONV with gallbladder   . Seasonal allergies   . Sinus drainage   . Snores    WEARS NIGHT GUARD  . SUI (stress urinary incontinence, female)     Assessment/Plan: 2 Days Post-Op Procedure(s) (LRB): Left total knee arthroplasty revision (Left) Principal Problem:   Failed total knee arthroplasty ([place])  Estimated body mass index is 44.21 kg/m as calculated from the following:   Height as of this encounter: 5\' 1"  (1.549 m).   Weight as of this encounter: 106.1 kg. Advance diet Up with therapy D/C IV fluids  DVT Prophylaxis - Aspirin Weight-bearing as tolerated  BP elevated through the night, PRN IV Hydralazine ordered. Patient's home BP medication of Lisinopril not currently ordered.  Plan for discharge around lunchtime today if meeting therapy goals today. Follow up in the office with Dr. Wynelle Link in two weeks. Physical therapy scheduled in [place] beginning Tuesday.   Theresa Duty, PA-C Orthopedic Surgery 05/20/2018, 7:18 AM

## 2018-05-23 NOTE — Discharge Summary (Signed)
Physician Discharge Summary   Patient ID: Maureen [name] MRN: [id number] DOB/AGE: 1956-03-04 62 y.o.  Admit date: 05/18/2018 Discharge date: 05/20/2018  Primary Diagnosis: Failed left total knee arthroplasty   Admission Diagnoses:  Past Medical History:  Diagnosis Date  . Arthritis    LEFT KNEE, LOWER BACK  . GERD (gastroesophageal reflux disease)   . History of hiatal hernia   . History of obstructive sleep apnea    per pt history osa w/ cpap until wt loss after gastric band, retested negative for sleep apnea  . Hypertension   . IBS (irritable bowel syndrome)   . Numbness    BOTTOM LEFT FOOT  . PONV (postoperative nausea and vomiting)    PONV with gallbladder   . Seasonal allergies   . Sinus drainage   . Snores    WEARS NIGHT GUARD  . SUI (stress urinary incontinence, female)    Discharge Diagnoses:   Principal Problem:   Failed total knee arthroplasty ([place])  Estimated body mass index is 44.21 kg/m as calculated from the following:   Height as of this encounter: _0  (1.549 m).   Weight as of this encounter: 106.1 kg.  Procedure:  Procedure(s) (LRB): Left total knee arthroplasty revision (Left)   Consults: None  HPI: The patient is a 62 year old female who underwent a total knee arthroplasty a couple of years ago at an outside facility. She has had an unstable feeling to the knee and has a significant valgus deformity. She has significant pain also. She presents now for total knee arthroplasty revision.  Laboratory Data: Admission on 05/18/2018, Discharged on 05/20/2018  Component Date Value Ref Range Status  . WBC 05/19/2018 13.5* 4.0 - 10.5 K/uL Final  . RBC 05/19/2018 3.89  3.87 - 5.11 MIL/uL Final  . Hemoglobin 05/19/2018 12.8  12.0 - 15.0 g/dL Final  . HCT 05/19/2018 39.0  36.0 - 46.0 % Final  . MCV 05/19/2018 100.3* 78.0 - 100.0 fL Final  . MCH 05/19/2018 32.9  26.0 - 34.0 pg Final  . MCHC 05/19/2018 32.8  30.0 - 36.0 g/dL Final  . RDW 05/19/2018 13.5   11.5 - 15.5 % Final  . Platelets 05/19/2018 287  150 - 400 K/uL Final   Performed at [facility], [place] [address] [place], [place] [address]  . Sodium 05/19/2018 137  135 - 145 mmol/L Final  . Potassium 05/19/2018 4.8  3.5 - 5.1 mmol/L Final  . Chloride 05/19/2018 102  98 - 111 mmol/L Final  . CO2 05/19/2018 26  22 - 32 mmol/L Final  . Glucose, Bld 05/19/2018 145* 70 - 99 mg/dL Final  . BUN 05/19/2018 14  8 - 23 mg/dL Final  . Creatinine, Ser 05/19/2018 0.79  0.44 - 1.00 mg/dL Final  . Calcium 05/19/2018 8.9  8.9 - 10.3 mg/dL Final  . GFR calc non Af Amer 05/19/2018 >60  >60 mL/min Final  . GFR calc Af Amer 05/19/2018 >60  >60 mL/min Final   Comment: (NOTE) The eGFR has been calculated using the CKD EPI equation. This calculation has not been validated in all clinical situations. eGFR's persistently <60 mL/min signify possible Chronic Kidney Disease.   Georgiann Hahn gap 05/19/2018 9  5 - 15 Final   Performed at [facility], [place] [address] [place], [place] [address]  . WBC 05/20/2018 12.0* 4.0 - 10.5 K/uL Final  . RBC 05/20/2018 3.70* 3.87 - 5.11 MIL/uL Final  . Hemoglobin 05/20/2018 12.1  12.0 - 15.0  g/dL Final  . HCT 05/20/2018 37.1  36.0 - 46.0 % Final  . MCV 05/20/2018 100.3* 78.0 - 100.0 fL Final  . MCH 05/20/2018 32.7  26.0 - 34.0 pg Final  . MCHC 05/20/2018 32.6  30.0 - 36.0 g/dL Final  . RDW 05/20/2018 13.5  11.5 - 15.5 % Final  . Platelets 05/20/2018 262  150 - 400 K/uL Final   Performed at [facility] [place] [address], [place], [place] [address]  . Sodium 05/20/2018 141  135 - 145 mmol/L Final  . Potassium 05/20/2018 4.3  3.5 - 5.1 mmol/L Final  . Chloride 05/20/2018 102  98 - 111 mmol/L Final  . CO2 05/20/2018 30  22 - 32 mmol/L Final  . Glucose, Bld 05/20/2018 119* 70 - 99 mg/dL Final  . BUN 05/20/2018 19  8 - 23 mg/dL Final  . Creatinine, Ser 05/20/2018 0.66  0.44 - 1.00 mg/dL Final  . Calcium 05/20/2018 9.4  8.9  - 10.3 mg/dL Final  . GFR calc non Af Amer 05/20/2018 >60  >60 mL/min Final  . GFR calc Af Amer 05/20/2018 >60  >60 mL/min Final   Comment: (NOTE) The eGFR has been calculated using the CKD EPI equation. This calculation has not been validated in all clinical situations. eGFR's persistently <60 mL/min signify possible Chronic Kidney Disease.   Georgiann Hahn gap 05/20/2018 9  5 - 15 Final   Performed at [facility], [place] [address] [place], [place] [address]  Hospital Outpatient Visit on 05/10/2018  Component Date Value Ref Range Status  . aPTT 05/10/2018 28  24 - 36 seconds Final   Performed at [facility], [place] [address], [place], [place] [address]  . WBC 05/10/2018 6.3  4.0 - 10.5 K/uL Final  . RBC 05/10/2018 4.13  3.87 - 5.11 MIL/uL Final  . Hemoglobin 05/10/2018 13.6  12.0 - 15.0 g/dL Final  . HCT 05/10/2018 41.7  36.0 - 46.0 % Final  . MCV 05/10/2018 101.0* 78.0 - 100.0 fL Final  . MCH 05/10/2018 32.9  26.0 - 34.0 pg Final  . MCHC 05/10/2018 32.6  30.0 - 36.0 g/dL Final  . RDW 05/10/2018 13.3  11.5 - 15.5 % Final  . Platelets 05/10/2018 248  150 - 400 K/uL Final   Performed at [facility], [place] [address], [place], [place] [address]  . Sodium 05/10/2018 142  135 - 145 mmol/L Final  . Potassium 05/10/2018 5.1  3.5 - 5.1 mmol/L Final  . Chloride 05/10/2018 104  98 - 111 mmol/L Final  . CO2 05/10/2018 28  22 - 32 mmol/L Final  . Glucose, Bld 05/10/2018 103* 70 - 99 mg/dL Final  . BUN 05/10/2018 23  8 - 23 mg/dL Final  . Creatinine, Ser 05/10/2018 0.89  0.44 - 1.00 mg/dL Final  . Calcium 05/10/2018 9.7  8.9 - 10.3 mg/dL Final  . Total Protein 05/10/2018 6.9  6.5 - 8.1 g/dL Final  . Albumin 05/10/2018 3.9  3.5 - 5.0 g/dL Final  . AST 05/10/2018 24  15 - 41 U/L Final  . ALT 05/10/2018 32  0 - 44 U/L Final  . Alkaline Phosphatase 05/10/2018 94  38 - 126 U/L Final  . Total Bilirubin 05/10/2018 0.5  0.3 - 1.2 mg/dL Final  . GFR  calc non Af Amer 05/10/2018 >60  >60 mL/min Final  . GFR calc Af Amer 05/10/2018 >60  >60 mL/min Final   Comment: (NOTE) The eGFR has  been calculated using the CKD EPI equation. This calculation has not been validated in all clinical situations. eGFR's persistently <60 mL/min signify possible Chronic Kidney Disease.   Georgiann Hahn gap 05/10/2018 10  5 - 15 Final   Performed at [facility], [place] [address] [place], [place] [address]  . Prothrombin Time 05/10/2018 12.0  11.4 - 15.2 seconds Final  . INR 05/10/2018 0.90   Final   Performed at [facility], [place] [address] [place], [place] [address]  . ABO/RH(D) 05/10/2018 O NEG   Final  . Antibody Screen 05/10/2018 NEG   Final  . Sample Expiration 05/10/2018 05/21/2018   Final  . Extend sample reason 05/10/2018 NO TRANSFUSIONS OR PREGNANCY IN THE PAST 3 MONTHS   Final  . Weak D 05/10/2018    Final                   Value:POS Performed at [facility], [place] [address], [place], [place] [address]   . MRSA, PCR 05/10/2018 NEGATIVE  NEGATIVE Final  . Staphylococcus aureus 05/10/2018 NEGATIVE  NEGATIVE Final   Comment: (NOTE) The Xpert SA Assay (FDA approved for NASAL specimens in patients 47 years of age and older), is one component of a comprehensive surveillance program. It is not intended to diagnose infection nor to guide or monitor treatment. Performed at [facility], [place] [address], [place], [place] [address]   . ABO/RH(D) 05/10/2018    Final                   Value:O NEG Performed at [facility], [place] [address], [place], [place] [address]      X-Rays:No results found.  EKG: Orders placed or performed during the hospital encounter of 05/10/18  . EKG 12-Lead  . EKG 12-Lead     Hospital Course: IVEE POELLNITZ is a 62 y.o. who was admitted to [facility]. They were brought to the operating room on 05/18/2018 and underwent  Procedure(s): Left total knee arthroplasty revision.  Patient tolerated the procedure well and was later transferred to the recovery room and then to the orthopaedic floor for postoperative care. They were given PO and IV analgesics for pain control following their surgery. They were given 24 hours of postoperative antibiotics of  Anti-infectives (From admission, onward)   Start     Dose/Rate Route Frequency Ordered Stop   05/18/18 1830  ceFAZolin (ANCEF) IVPB 2g/100 mL premix     2 g 200 mL/hr over 30 Minutes Intravenous Every 6 hours 05/18/18 1658 05/19/18 0025   05/18/18 1015  ceFAZolin (ANCEF) IVPB 2g/100 mL premix     2 g 200 mL/hr over 30 Minutes Intravenous On call to O.R. 05/18/18 1003 05/18/18 1253     and started on DVT prophylaxis in the form of Aspirin.   PT and OT were ordered for total joint protocol. Discharge planning consulted to help with postop disposition and equipment needs. Patient had a decent night on the evening of surgery. They started to get up OOB with therapy on POD #1. Hemovac drains were pulled without difficulty on day one. Continued to work with therapy into POD #2. She had elevated blood pressure on the night of POD #1, PRN IV hydralazine was ordered. Pt was seen during rounds on day two and was ready to go home pending progress with therapy. Dressing was changed and the incision was clean, dry and intact with no  drainage. Pt worked with therapy for one additional session and was meeting their goals. She was discharged to home later that day in stable condition.  Diet: Regular diet Activity: WBAT Follow-up: in 2 weeks with Dr. Wynelle Link Disposition: Home with outpatient physical therapy in [facility] Discharged Condition: stable   Discharge Instructions    Call MD / Call 911   Complete by:  As directed    If you experience chest pain or shortness of breath, CALL 911 and be transported to the hospital emergency room.  If you develope a fever above 101 F, pus  (white drainage) or increased drainage or redness at the wound, or calf pain, call your surgeon's office.   Change dressing   Complete by:  As directed    Change the dressing daily with sterile 4 x 4 inch gauze dressing and apply TED hose.   Constipation Prevention   Complete by:  As directed    Drink plenty of fluids.  Prune juice may be helpful.  You may use a stool softener, such as Colace (over the counter) 100 mg twice a day.  Use MiraLax (over the counter) for constipation as needed.   Diet - low sodium heart healthy   Complete by:  As directed    Discharge instructions   Complete by:  As directed    Dr. Gaynelle Arabian Total Joint Specialist [facility] [address], [place],  [address] [phone]  TOTAL KNEE REVISION POSTOPERATIVE DIRECTIONS  Knee Rehabilitation, Guidelines Following Surgery  Results after knee surgery are often greatly improved when you follow the exercise, range of motion and muscle strengthening exercises prescribed by your doctor. Safety measures are also important to protect the knee from further injury. Any time any of these exercises cause you to have increased pain or swelling in your knee joint, decrease the amount until you are comfortable again and slowly increase them. If you have problems or questions, call your caregiver or physical therapist for advice.   HOME CARE INSTRUCTIONS  Remove items at home which could result in a fall. This includes throw rugs or furniture in walking pathways.  ICE to the affected knee every three hours for 30 minutes at a time and then as needed for pain and swelling.  Continue to use ice on the knee for pain and swelling from surgery. You may notice swelling that will progress down to the foot and ankle.  This is normal after surgery.  Elevate the leg when you are not up walking on it.   Continue to use the breathing machine which will help keep your temperature down.  It is common for your temperature  to cycle up and down following surgery, especially at night when you are not up moving around and exerting yourself.  The breathing machine keeps your lungs expanded and your temperature down. Do not place pillow under knee, focus on keeping the knee straight while resting   DIET You may resume your previous home diet once your are discharged from the hospital.  DRESSING / WOUND CARE / SHOWERING You may shower 3 days after surgery, but keep the wounds dry during showering.  You may use an occlusive plastic wrap (Press'n Seal for example), NO SOAKING/SUBMERGING IN THE BATHTUB.  If the bandage gets wet, change with a clean dry gauze.  If the incision gets wet, pat the wound dry with a clean towel. You may start showering once you are discharged home but do not submerge the incision under  water. Just pat the incision dry and apply a dry gauze dressing on daily. Change the surgical dressing daily and reapply a dry dressing each time.  ACTIVITY Walk with your walker as instructed. Use walker as long as suggested by your caregivers. Avoid periods of inactivity such as sitting longer than an hour when not asleep. This helps prevent blood clots.  You may resume a sexual relationship in one month or when given the OK by your doctor.  You may return to work once you are cleared by your doctor.  Do not drive a car for 6 weeks or until released by you surgeon.  Do not drive while taking narcotics.  WEIGHT BEARING Weight bearing as tolerated with assist device (walker, cane, etc) as directed, use it as long as suggested by your surgeon or therapist, typically at least 4-6 weeks.  POSTOPERATIVE CONSTIPATION PROTOCOL Constipation - defined medically as fewer than three stools per week and severe constipation as less than one stool per week.  One of the most common issues patients have following surgery is constipation.  Even if you have a regular bowel pattern at home, your normal regimen is likely to be  disrupted due to multiple reasons following surgery.  Combination of anesthesia, postoperative narcotics, change in appetite and fluid intake all can affect your bowels.  In order to avoid complications following surgery, here are some recommendations in order to help you during your recovery period.  Colace (docusate) - Pick up an over-the-counter form of Colace or another stool softener and take twice a day as long as you are requiring postoperative pain medications.  Take with a full glass of water daily.  If you experience loose stools or diarrhea, hold the colace until you stool forms back up.  If your symptoms do not get better within 1 week or if they get worse, check with your doctor.  Dulcolax (bisacodyl) - Pick up over-the-counter and take as directed by the product packaging as needed to assist with the movement of your bowels.  Take with a full glass of water.  Use this product as needed if not relieved by Colace only.   MiraLax (polyethylene glycol) - Pick up over-the-counter to have on hand.  MiraLax is a solution that will increase the amount of water in your bowels to assist with bowel movements.  Take as directed and can mix with a glass of water, juice, soda, coffee, or tea.  Take if you go more than two days without a movement. Do not use MiraLax more than once per day. Call your doctor if you are still constipated or irregular after using this medication for 7 days in a row.  If you continue to have problems with postoperative constipation, please contact the office for further assistance and recommendations.  If you experience "the worst abdominal pain ever" or develop nausea or vomiting, please contact the office immediatly for further recommendations for treatment.  ITCHING  If you experience itching with your medications, try taking only a single pain pill, or even half a pain pill at a time.  You can also use Benadryl over the counter for itching or also to help with sleep.    TED HOSE STOCKINGS Wear the elastic stockings on both legs for three weeks following surgery during the day but you may remove then at night for sleeping.  MEDICATIONS See your medication summary on the "After Visit Summary" that the nursing staff will review with you prior to discharge.  You may  have some home medications which will be placed on hold until you complete the course of blood thinner medication.  It is important for you to complete the blood thinner medication as prescribed by your surgeon.  Continue your approved medications as instructed at time of discharge.  PRECAUTIONS If you experience chest pain or shortness of breath - call 911 immediately for transfer to the hospital emergency department.  If you develop a fever greater that 101 F, purulent drainage from wound, increased redness or drainage from wound, foul odor from the wound/dressing, or calf pain - CONTACT YOUR SURGEON.                                                   FOLLOW-UP APPOINTMENTS Make sure you keep all of your appointments after your operation with your surgeon and caregivers. You should call the office at the above phone number and make an appointment for approximately two weeks after the date of your surgery or on the date instructed by your surgeon outlined in the "After Visit Summary".   RANGE OF MOTION AND STRENGTHENING EXERCISES  Rehabilitation of the knee is important following a knee injury or an operation. After just a few days of immobilization, the muscles of the thigh which control the knee become weakened and shrink (atrophy). Knee exercises are designed to build up the tone and strength of the thigh muscles and to improve knee motion. Often times heat used for twenty to thirty minutes before working out will loosen up your tissues and help with improving the range of motion but do not use heat for the first two weeks following surgery. These exercises can be done on a training (exercise) mat, on  the floor, on a table or on a bed. Use what ever works the best and is most comfortable for you Knee exercises include:  Leg Lifts - While your knee is still immobilized in a splint or cast, you can do straight leg raises. Lift the leg to 60 degrees, hold for 3 sec, and slowly lower the leg. Repeat 10-20 times 2-3 times daily. Perform this exercise against resistance later as your knee gets better.  Quad and Hamstring Sets - Tighten up the muscle on the front of the thigh (Quad) and hold for 5-10 sec. Repeat this 10-20 times hourly. Hamstring sets are done by pushing the foot backward against an object and holding for 5-10 sec. Repeat as with quad sets.  Leg Slides: Lying on your back, slowly slide your foot toward your buttocks, bending your knee up off the floor (only go as far as is comfortable). Then slowly slide your foot back down until your leg is flat on the floor again. Angel Wings: Lying on your back spread your legs to the side as far apart as you can without causing discomfort.  A rehabilitation program following serious knee injuries can speed recovery and prevent re-injury in the future due to weakened muscles. Contact your doctor or a physical therapist for more information on knee rehabilitation.   IF YOU ARE TRANSFERRED TO A SKILLED REHAB FACILITY If the patient is transferred to a skilled rehab facility following release from the hospital, a list of the current medications will be sent to the facility for the patient to continue.  When discharged from the skilled rehab facility, please have the facility set  up the patient's [place] prior to being released. Also, the skilled facility will be responsible for providing the patient with their medications at time of release from the facility to include their pain medication, the muscle relaxants, and their blood thinner medication. If the patient is still at the rehab facility at time of the two week follow up appointment,  the skilled rehab facility will also need to assist the patient in arranging follow up appointment in our office and any transportation needs.  MAKE SURE YOU:  Understand these instructions.  Get help right away if you are not doing well or get worse.    Pick up stool softner and laxative for home use following surgery while on pain medications. Do not submerge incision under water. Please use good hand washing techniques while changing dressing each day. May shower starting three days after surgery. Please use a clean towel to pat the incision dry following showers. Continue to use ice for pain and swelling after surgery. Do not use any lotions or creams on the incision until instructed by your surgeon.   Do not put a pillow under the knee. Place it under the heel.   Complete by:  As directed    Driving restrictions   Complete by:  As directed    No driving for two weeks   TED hose   Complete by:  As directed    Use stockings (TED hose) for three weeks on both leg(s).  You may remove them at night for sleeping.   Weight bearing as tolerated   Complete by:  As directed      Allergies as of 05/20/2018      Reactions   Mucinex [guaifenesin Er] Other (See Comments)   "feels weird and dizzy"   Tramadol Itching      Medication List    STOP taking these medications   diclofenac 75 MG EC tablet Commonly known as:  VOLTAREN     TAKE these medications   acetaminophen 500 MG tablet Commonly known as:  TYLENOL Take 1,000 mg by mouth every 6 (six) hours as needed for moderate pain or headache.   aspirin 325 MG EC tablet Take 1 tablet (325 mg total) by mouth 2 (two) times daily for 19 days. Take one tablet (325 mg) Aspirin two times a day for three weeks following surgery. Then take one baby Aspirin (81 mg) once a day for three weeks. Then discontinue aspirin.   CO Q10 PO Take 1 capsule by mouth daily.   gabapentin 300 MG capsule Commonly known as:  NEURONTIN Take 1 capsule  (300 mg total) by mouth 3 (three) times daily. Gabapentin 300 mg Protocol Take a 300 mg capsule three times a day for two weeks following surgery. Then take a 300 mg capsule two times a day for two weeks.  Then take a 300 mg capsule once a day for two weeks.  Then discontinue the Gabapentin.   hydrochlorothiazide 12.5 MG tablet Commonly known as:  HYDRODIURIL TAKE 1 TABLET BY MOUTH DAILY   lisinopril 40 MG tablet Commonly known as:  PRINIVIL,ZESTRIL TAKE 1 TABLET BY MOUTH DAILY   loratadine 10 MG tablet Commonly known as:  CLARITIN Take 10 mg by mouth every morning.   methocarbamol 500 MG tablet Commonly known as:  ROBAXIN Take 1 tablet (500 mg total) by mouth every 6 (six) hours as needed for muscle spasms.   multivitamin capsule Take 1 capsule by mouth daily.   NYQUIL COLD &  FLU PO Take by mouth as needed.   omeprazole 20 MG capsule Commonly known as:  PRILOSEC Take 1 capsule (20 mg total) by mouth daily. What changed:  when to take this   oxyCODONE 5 MG immediate release tablet Commonly known as:  Oxy IR/ROXICODONE Take 1-2 tablets (5-10 mg total) by mouth every 6 (six) hours as needed for moderate pain (pain score 4-6).   predniSONE 20 MG tablet Commonly known as:  DELTASONE 2 tablets daily for 3 days, 1 tablet daily for 4 days.   QC DAYTIME COLD/FLU PO Take by mouth as needed.   SLOW RELEASE IRON 45 MG Tbcr Generic drug:  Ferrous Sulfate Dried Take 45 mg by mouth at bedtime.   venlafaxine XR 75 MG 24 hr capsule Commonly known as:  EFFEXOR-XR TAKE 1 CAPSULE BY MOUTH  DAILY What changed:  when to take this   Vitamin D-3 5000 units Tabs Take 5,000 Units by mouth daily.            Discharge Care Instructions  (From admission, onward)         Start     Ordered   05/20/18 0000  Weight bearing as tolerated     05/20/18 0728   05/20/18 0000  Change dressing    Comments:  Change the dressing daily with sterile 4 x 4 inch gauze dressing and apply TED  hose.   05/20/18 8242         Follow-up Information    Gaynelle Arabian, MD. Schedule an appointment as soon as possible for a visit on 06/02/2018.   Specialty:  Orthopedic Surgery Contact information: [address] [place] [address] [phone]           Signed: Theresa Duty, PA-C Orthopedic Surgery 05/23/2018, 7:58 AM

## 2018-06-23 NOTE — Progress Notes (Signed)
Complete Physical  Assessment and Plan: Essential hypertension - continue medications, DASH diet, exercise and monitor at home. Call if greater than 130/80.   Prediabetes Discussed general issues about diabetes pathophysiology and management., Educational material distributed., Suggested low cholesterol diet., Encouraged aerobic exercise., Discussed foot care., Reminded to get yearly retinal exam.   Hyperlipidemia -continue medications, check lipids, decrease fatty foods, increase activity.   Morbid obesity Obesity with co morbidities- long discussion about weight loss, diet, and exercise   LAP-BAND surgery status Follow up Dr. Hassell Done   Asthma, unspecified asthma severity, uncomplicated Asthma controlled   IBS (irritable bowel syndrome) Diet controlled   Gastroesophageal reflux disease with esophagitis Continue PPI/H2 blocker, diet discussed   Arthritis RICE, NSAIDS, exercises given, follow up ortho  Allergy, subsequent encounter Continue OTC allergy pills   Vitamin D deficiency Continue supplement   Medication management   Discussed med's effects and SE's. Screening labs and tests as requested with regular follow-up as recommended.Gets labs done at [facility]  HPI 62 y.o. female  presents for a complete physical.  Patient is 1 month s/p left knee revision, saw Dr. Elmyra Ricks Friday, she is walking with a cane. She is on gabapentin for pain, this is her last week.    Her blood pressure has been controlled at home, runs 120's at home, on lisinopril 40 and HCTZ 12.5mg  today their BP is BP: 126/78. She had her She denies chest pain, shortness of breath, dizziness.   Her cholesterol is diet controlled. Her cholesterol is controlled. She is NOT fasting for labs today. The cholesterol last visit was:  Lab Results  Component Value Date   CHOL 202 (H) 01/03/2018   HDL 56 01/03/2018   LDLCALC 82 01/03/2018   TRIG 321 (H) 01/03/2018   She has been working on diet and exercise  for prediabetes, and denies blurry vision, polydipsia, polyphagia and polyuria. Last A1C in the office was: Lab Results  Component Value Date   HGBA1C 5.4 01/29/2017  Patient is on Vitamin D supplements.  Lab Results  Component Value Date   VD25OH 34.1 01/03/2018   She is on prilosec for GERD. She is on effexor for IBS/diarrhea, she feels that it is not helping but would like to stay on it to prevent hot flashes.  Works at [employer], gets labs there.   She has lower back pain with sciatica left leg occ.  She is s/p lap band, follows with [facility], still struggling with weight loss.  Wt Readings from Last 6 Encounters:  06/27/18 239 lb (108.4 kg)  05/18/18 234 lb (106.1 kg)  05/10/18 141 lb 4 oz (64.1 kg)  04/27/18 242 lb 9.6 oz (110 kg)  12/28/17 239 lb (108.4 kg)  12/08/17 230 lb (104.3 kg)    Current Medications:  Current Outpatient Medications on File Prior to Visit  Medication Sig Dispense Refill  . acetaminophen (TYLENOL) 500 MG tablet Take 1,000 mg by mouth every 6 (six) hours as needed for moderate pain or headache.    . Cholecalciferol (VITAMIN D-3) 5000 units TABS Take 5,000 Units by mouth daily.    . Coenzyme Q10 (CO Q10 PO) Take 1 capsule by mouth daily.    . Ferrous Sulfate Dried (SLOW RELEASE IRON) 45 MG TBCR Take 45 mg by mouth at bedtime.    . gabapentin (NEURONTIN) 300 MG capsule Take 1 capsule (300 mg total) by mouth 3 (three) times daily. Gabapentin 300 mg Protocol Take a 300 mg capsule three times a  day for two weeks following surgery. Then take a 300 mg capsule two times a day for two weeks.  Then take a 300 mg capsule once a day for two weeks.  Then discontinue the Gabapentin. 84 capsule 0  . hydrochlorothiazide (HYDRODIURIL) 12.5 MG tablet TAKE 1 TABLET BY MOUTH DAILY 90 tablet 1  . HYDROCODONE-ACETAMINOPHEN PO Take by mouth.    Marland Kitchen lisinopril (PRINIVIL,ZESTRIL) 40 MG tablet TAKE 1 TABLET BY MOUTH DAILY 90 tablet 1  . loratadine (CLARITIN) 10  MG tablet Take 10 mg by mouth every morning.     . Multiple Vitamin (MULTIVITAMIN) capsule Take 1 capsule by mouth daily.     Marland Kitchen omeprazole (PRILOSEC) 20 MG capsule Take 1 capsule (20 mg total) by mouth daily. (Patient taking differently: Take 20 mg by mouth every evening. ) 90 capsule 1  . venlafaxine XR (EFFEXOR-XR) 75 MG 24 hr capsule TAKE 1 CAPSULE BY MOUTH  DAILY (Patient taking differently: Take 75 mg by mouth at bedtime. ) 90 capsule 1   No current facility-administered medications on file prior to visit.    Health Maintenance:  Immunization History  Administered Date(s) Administered  . Influenza Inj Mdck Quad With Preservative 06/27/2018  . Influenza-Unspecified 07/11/2013  . Td 04/28/2006  . Tdap 12/04/2015   Tetanus: 2017 Pneumovax:N/A Prevnar 13: due at 74 Flu vaccine: 2019 TODAY Zostavax: N/A  Pap: 2017 MGM: 12/2017 3D neg CAT B at [facility]- (Dr. Redmond Pulling)   DEXA: 2010 normal- non smoker, no family history, BMI elevated, no broken bones Colonoscopy: 2009 normal due 2019, HAS APPOINTMENT FIRST OF THE YEAR (Dr. Collene Mares) EGD: N/A Myoview stress test 10/2001 AB [place] 2003 DEE 01/2015 Dr. Gaylyn Cheers Dentist: Dr. Carman Ching in [place], q 6 months Derm Dr. Nicole Kindred, [place]  Medical History:  Past Medical History:  Diagnosis Date  . Arthritis    LEFT KNEE, LOWER BACK  . GERD (gastroesophageal reflux disease)   . History of hiatal hernia   . History of obstructive sleep apnea    per pt history osa w/ cpap until wt loss after gastric band, retested negative for sleep apnea  . Hypertension   . IBS (irritable bowel syndrome)   . Numbness    BOTTOM LEFT FOOT  . PONV (postoperative nausea and vomiting)    PONV with gallbladder   . Seasonal allergies   . Sinus drainage   . Snores    WEARS NIGHT GUARD  . SUI (stress urinary incontinence, female)    Allergies Allergies  Allergen Reactions  . Mucinex [Guaifenesin Er] Other (See Comments)    "feels weird and dizzy"  . 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 FAMILY HISTORY Her family history includes Heart disease in her father; Hyperlipidemia in her father. SOCIAL HISTORY She  reports that she quit smoking about 19 years ago. Her smoking use included cigarettes. She has a 7.00 pack-year smoking history. She has never used smokeless tobacco. She reports that she does not drink alcohol or use  drugs.  Review of Systems  Constitutional: Negative.   HENT: Negative for congestion, ear discharge, ear pain, hearing loss, nosebleeds, sore throat and tinnitus.   Eyes: Negative.   Respiratory: Negative for cough, hemoptysis, sputum production, shortness of breath, wheezing and stridor.   Cardiovascular: Negative.   Gastrointestinal: Negative for abdominal pain, blood in stool, constipation, diarrhea, heartburn, melena, nausea and vomiting.  Genitourinary: Negative.   Musculoskeletal: Positive for joint pain (left knee). Negative for back pain (lower back pain with pain into her left leg), falls, myalgias and neck pain.  Skin: Negative.   Neurological: Negative.  Negative for headaches.  Endo/Heme/Allergies: Negative.   Psychiatric/Behavioral: Negative.     Physical Exam: Blood pressure 126/78, pulse 68, temperature 97.6 F (36.4 C), resp. rate 16, height  5\' 1"  (1.549 m), weight 239 lb (108.4 kg), SpO2 98 %. Body mass index is 45.16 kg/m. General Appearance: Well nourished, in no apparent distress. Eyes: PERRLA, EOMs, conjunctiva no swelling or erythema, normal fundi and vessels. Sinuses: No Frontal/maxillary tenderness ENT/Mouth: Ext aud canals clear, normal light reflex with TMs without erythema, bulging.  Good dentition. No erythema, swelling, or exudate on post pharynx. Tonsils not swollen or erythematous. Hearing normal.  Neck: Supple, thyroid normal. No bruits Respiratory: Respiratory effort normal, BS equal bilaterally without rales, rhonchi, wheezing or stridor. Cardio: RRR without murmurs, rubs or gallops. Brisk peripheral pulses without edema.  Chest: symmetric, with normal excursions and percussion. Breasts: defer Abdomen: Soft, +BS, obese, notenderness, no guarding, rebound, hernias, masses, or organomegaly. .  Lymphatics: Non tender without lymphadenopathy.  Genitourinary: defer Musculoskeletal: Full ROM all peripheral extremities,5/5 strength, and antalgic gait with cane. Skin: left cheek near neck with flesh colored nodule 2x3, no telangiectasias, erythema, will monitor. Warm, dry without rashes, lesions, ecchymosis.  Neuro: Cranial nerves intact, reflexes equal bilaterally. Normal muscle tone, no cerebellar symptoms. Sensation intact.  Psych: Awake and oriented X 3, normal affect, Insight and Judgment appropriate.   EKG: no need for EKG, had 05/10/2018    [name] 9:17 AM

## 2018-06-27 ENCOUNTER — Ambulatory Visit (INDEPENDENT_AMBULATORY_CARE_PROVIDER_SITE_OTHER): Payer: Managed Care, Other (non HMO) | Admitting: Physician Assistant

## 2018-06-27 ENCOUNTER — Encounter: Payer: Self-pay | Admitting: Physician Assistant

## 2018-06-27 ENCOUNTER — Other Ambulatory Visit: Payer: Self-pay | Admitting: Physician Assistant

## 2018-06-27 VITALS — BP 126/78 | HR 68 | Temp 97.6°F | Resp 16 | Ht 61.0 in | Wt 239.0 lb

## 2018-06-27 DIAGNOSIS — Z23 Encounter for immunization: Secondary | ICD-10-CM | POA: Diagnosis not present

## 2018-06-27 DIAGNOSIS — J3089 Other allergic rhinitis: Secondary | ICD-10-CM

## 2018-06-27 DIAGNOSIS — N393 Stress incontinence (female) (male): Secondary | ICD-10-CM

## 2018-06-27 DIAGNOSIS — Z0000 Encounter for general adult medical examination without abnormal findings: Secondary | ICD-10-CM

## 2018-06-27 DIAGNOSIS — K2100 Gastro-esophageal reflux disease with esophagitis, without bleeding: Secondary | ICD-10-CM

## 2018-06-27 DIAGNOSIS — E559 Vitamin D deficiency, unspecified: Secondary | ICD-10-CM

## 2018-06-27 DIAGNOSIS — Z79899 Other long term (current) drug therapy: Secondary | ICD-10-CM

## 2018-06-27 DIAGNOSIS — E785 Hyperlipidemia, unspecified: Secondary | ICD-10-CM

## 2018-06-27 DIAGNOSIS — K589 Irritable bowel syndrome without diarrhea: Secondary | ICD-10-CM

## 2018-06-27 DIAGNOSIS — I10 Essential (primary) hypertension: Secondary | ICD-10-CM

## 2018-06-27 DIAGNOSIS — D649 Anemia, unspecified: Secondary | ICD-10-CM

## 2018-06-27 DIAGNOSIS — Z9884 Bariatric surgery status: Secondary | ICD-10-CM

## 2018-06-27 NOTE — Patient Instructions (Signed)
  [employer] mindful eating  Rate your hunger before you eat on a scale of 1-10, try to eat closer to a 6 or higher. And if you are at below that, why are you eating?     When it comes to diets, agreement about the perfect plan isn't easy to find, even among the experts. Experts at the [place] developed an idea known as the Healthy Eating Plate. Just imagine a plate divided into logical, healthy portions.  The emphasis is on diet quality:  Load up on vegetables and fruits - one-half of your plate: Aim for color and variety, and remember that potatoes don't count.  Go for whole grains - one-quarter of your plate: Whole wheat, barley, wheat berries, quinoa, oats, brown rice, and foods made with them. If you want pasta, go with whole wheat pasta.  Protein power - one-quarter of your plate: Fish, chicken, beans, and nuts are all healthy, versatile protein sources. Limit red meat.  The diet, however, does go beyond the plate, offering a few other suggestions.  Use healthy plant oils, such as olive, canola, soy, corn, sunflower and peanut. Check the labels, and avoid partially hydrogenated oil, which have unhealthy trans fats.  If you're thirsty, drink water. Coffee and tea are good in moderation, but skip sugary drinks and limit milk and dairy products to one or two daily servings.  The type of carbohydrate in the diet is more important than the amount. Some sources of carbohydrates, such as vegetables, fruits, whole grains, and beans-are healthier than others.  Finally, stay active.

## 2018-06-28 LAB — CBC WITH DIFFERENTIAL/PLATELET
BASOS ABS: 0 10*3/uL (ref 0.0–0.2)
Basos: 1 %
EOS (ABSOLUTE): 0.1 10*3/uL (ref 0.0–0.4)
Eos: 2 %
Hematocrit: 38.2 % (ref 34.0–46.6)
Hemoglobin: 12.7 g/dL (ref 11.1–15.9)
IMMATURE GRANULOCYTES: 0 %
Immature Grans (Abs): 0 10*3/uL (ref 0.0–0.1)
Lymphocytes Absolute: 1.4 10*3/uL (ref 0.7–3.1)
Lymphs: 27 %
MCH: 32 pg (ref 26.6–33.0)
MCHC: 33.2 g/dL (ref 31.5–35.7)
MCV: 96 fL (ref 79–97)
MONOS ABS: 0.3 10*3/uL (ref 0.1–0.9)
Monocytes: 6 %
Neutrophils Absolute: 3.3 10*3/uL (ref 1.4–7.0)
Neutrophils: 64 %
PLATELETS: 268 10*3/uL (ref 150–450)
RBC: 3.97 x10E6/uL (ref 3.77–5.28)
RDW: 12 % — ABNORMAL LOW (ref 12.3–15.4)
WBC: 5.1 10*3/uL (ref 3.4–10.8)

## 2018-06-28 LAB — MICROSCOPIC EXAMINATION: Bacteria, UA: NONE SEEN

## 2018-06-28 LAB — HEPATIC FUNCTION PANEL
ALBUMIN: 4.3 g/dL (ref 3.6–4.8)
ALK PHOS: 130 IU/L — AB (ref 39–117)
ALT: 24 IU/L (ref 0–32)
AST: 19 IU/L (ref 0–40)
BILIRUBIN TOTAL: 0.3 mg/dL (ref 0.0–1.2)
BILIRUBIN, DIRECT: 0.08 mg/dL (ref 0.00–0.40)
TOTAL PROTEIN: 6.5 g/dL (ref 6.0–8.5)

## 2018-06-28 LAB — URINALYSIS, COMPLETE
BILIRUBIN UA: NEGATIVE
Glucose, UA: NEGATIVE
KETONES UA: NEGATIVE
Leukocytes, UA: NEGATIVE
Nitrite, UA: NEGATIVE
PROTEIN UA: NEGATIVE
RBC UA: NEGATIVE
Specific Gravity, UA: >=1.03 — AB (ref 1.005–1.030)
UUROB: 0.2 mg/dL (ref 0.2–1.0)
pH, UA: 5 (ref 5.0–7.5)

## 2018-06-28 LAB — SPECIMEN STATUS REPORT

## 2018-06-28 LAB — LIPID PANEL W/O CHOL/HDL RATIO
Cholesterol, Total: 194 mg/dL (ref 100–199)
HDL: 57 mg/dL (ref >39)
LDL Calculated: 85 mg/dL (ref 0–99)
Triglycerides: 262 mg/dL — ABNORMAL HIGH (ref 0–149)
VLDL Cholesterol Cal: 52 mg/dL — ABNORMAL HIGH (ref 5–40)

## 2018-06-28 LAB — BASIC METABOLIC PANEL
BUN / CREAT RATIO: 29 — AB (ref 12–28)
BUN: 22 mg/dL (ref 8–27)
CO2: 26 mmol/L (ref 20–29)
Calcium: 10.4 mg/dL — ABNORMAL HIGH (ref 8.7–10.3)
Chloride: 99 mmol/L (ref 96–106)
Creatinine, Ser: 0.75 mg/dL (ref 0.57–1.00)
GFR calc Af Amer: 98 mL/min/{1.73_m2} (ref >59)
GFR, EST NON AFRICAN AMERICAN: 85 mL/min/{1.73_m2} (ref >59)
Glucose: 97 mg/dL (ref 65–99)
POTASSIUM: 4.1 mmol/L (ref 3.5–5.2)
SODIUM: 141 mmol/L (ref 134–144)

## 2018-06-28 LAB — FERRITIN: FERRITIN: 148 ng/mL (ref 15–150)

## 2018-06-28 LAB — VITAMIN B12: Vitamin B-12: 582 pg/mL (ref 232–1245)

## 2018-06-28 LAB — MAGNESIUM: Magnesium: 1.9 mg/dL (ref 1.6–2.3)

## 2018-06-28 LAB — VITAMIN D 25 HYDROXY (VIT D DEFICIENCY, FRACTURES): VIT D 25 HYDROXY: 46.1 ng/mL (ref 30.0–100.0)

## 2018-06-28 LAB — IRON: Iron: 77 ug/dL (ref 27–139)

## 2018-07-11 ENCOUNTER — Encounter: Payer: Self-pay | Admitting: Obstetrics & Gynecology

## 2018-07-12 ENCOUNTER — Encounter: Payer: Self-pay | Admitting: Obstetrics & Gynecology

## 2018-07-18 ENCOUNTER — Other Ambulatory Visit: Payer: Self-pay | Admitting: Adult Health

## 2018-07-18 DIAGNOSIS — G8929 Other chronic pain: Secondary | ICD-10-CM

## 2018-07-18 DIAGNOSIS — M545 Low back pain: Principal | ICD-10-CM

## 2018-08-30 ENCOUNTER — Other Ambulatory Visit: Payer: Self-pay | Admitting: Internal Medicine

## 2018-09-12 ENCOUNTER — Encounter: Payer: Self-pay | Admitting: Adult Health

## 2018-09-12 ENCOUNTER — Ambulatory Visit (INDEPENDENT_AMBULATORY_CARE_PROVIDER_SITE_OTHER): Payer: Managed Care, Other (non HMO) | Admitting: Adult Health

## 2018-09-12 VITALS — BP 120/82 | HR 64 | Temp 97.8°F | Ht 61.0 in | Wt 240.2 lb

## 2018-09-12 DIAGNOSIS — M778 Other enthesopathies, not elsewhere classified: Secondary | ICD-10-CM

## 2018-09-12 DIAGNOSIS — M25512 Pain in left shoulder: Secondary | ICD-10-CM | POA: Diagnosis not present

## 2018-09-12 DIAGNOSIS — M7582 Other shoulder lesions, left shoulder: Secondary | ICD-10-CM | POA: Diagnosis not present

## 2018-09-12 MED ORDER — PREDNISONE 20 MG PO TABS: ORAL_TABLET | ORAL | 0 refills | Status: DC

## 2018-09-12 NOTE — Patient Instructions (Addendum)
Can try tumeric+ black pepper fruit/bioprene, or tart cherry supplement for natural antiinflammatory    Shoulder Exercises Ask your health care provider which exercises are safe for you. Do exercises exactly as told by your health care provider and adjust them as directed. It is normal to feel mild stretching, pulling, tightness, or discomfort as you do these exercises, but you should stop right away if you feel sudden pain or your pain gets worse.Do not begin these exercises until told by your health care provider. Range of Motion Exercises        These exercises warm up your muscles and joints and improve the movement and flexibility of your shoulder. These exercises also help to relieve pain, numbness, and tingling. These exercises involve stretching your injured shoulder directly. Exercise A: Pendulum 1. Stand near a wall or a surface that you can hold onto for balance. 2. Bend at the waist and let your left / right arm hang straight down. Use your other arm to support you. Keep your back straight and do not lock your knees. 3. Relax your left / right arm and shoulder muscles, and move your hips and your trunk so your left / right arm swings freely. Your arm should swing because of the motion of your body, not because you are using your arm or shoulder muscles. 4. Keep moving your body so your arm swings in the following directions, as told by your health care provider: ? Side to side. ? Forward and backward. ? In clockwise and counterclockwise circles. 5. Continue each motion for __________ seconds, or for as long as told by your health care provider. 6. Slowly return to the starting position. Repeat __________ times. Complete this exercise __________ times a day. Exercise B:Flexion, Standing 1. Stand and hold a broomstick, a cane, or a similar object. Place your hands a little more than shoulder-width apart on the object. Your left / right hand should be palm-up, and your other hand  should be palm-down. 2. Keep your elbow straight and keep your shoulder muscles relaxed. Push the stick down with your healthy arm to raise your left / right arm in front of your body, and then over your head until you feel a stretch in your shoulder. ? Avoid shrugging your shoulder while you raise your arm. Keep your shoulder blade tucked down toward the middle of your back. 3. Hold for __________ seconds. 4. Slowly return to the starting position. Repeat __________ times. Complete this exercise __________ times a day. Exercise C: Abduction, Standing 1. Stand and hold a broomstick, a cane, or a similar object. Place your hands a little more than shoulder-width apart on the object. Your left / right hand should be palm-up, and your other hand should be palm-down. 2. While keeping your elbow straight and your shoulder muscles relaxed, push the stick across your body toward your left / right side. Raise your left / right arm to the side of your body and then over your head until you feel a stretch in your shoulder. ? Do not raise your arm above shoulder height, unless your health care provider tells you to do that. ? Avoid shrugging your shoulder while you raise your arm. Keep your shoulder blade tucked down toward the middle of your back. 3. Hold for __________ seconds. 4. Slowly return to the starting position. Repeat __________ times. Complete this exercise __________ times a day. Exercise D:Internal Rotation 1. Place your left / right hand behind your back, palm-up. 2. Use your  other hand to dangle an exercise band, a towel, or a similar object over your shoulder. Grasp the band with your left / right hand so you are holding onto both ends. 3. Gently pull up on the band until you feel a stretch in the front of your left / right shoulder. ? Avoid shrugging your shoulder while you raise your arm. Keep your shoulder blade tucked down toward the middle of your back. 4. Hold for __________  seconds. 5. Release the stretch by letting go of the band and lowering your hands. Repeat __________ times. Complete this exercise __________ times a day. Stretching Exercises  These exercises warm up your muscles and joints and improve the movement and flexibility of your shoulder. These exercises also help to relieve pain, numbness, and tingling. These exercises are done using your healthy shoulder to help stretch the muscles of your injured shoulder. Exercise E: Warehouse manager (External Rotation and Abduction) 1. Stand in a doorway with one of your feet slightly in front of the other. This is called a staggered stance. If you cannot reach your forearms to the door frame, stand facing a corner of a room. 2. Choose one of the following positions as told by your health care provider: ? Place your hands and forearms on the door frame above your head. ? Place your hands and forearms on the door frame at the height of your head. ? Place your hands on the door frame at the height of your elbows. 3. Slowly move your weight onto your front foot until you feel a stretch across your chest and in the front of your shoulders. Keep your head and chest upright and keep your abdominal muscles tight. 4. Hold for __________ seconds. 5. To release the stretch, shift your weight to your back foot. Repeat __________ times. Complete this stretch __________ times a day. Exercise F:Extension, Standing 1. Stand and hold a broomstick, a cane, or a similar object behind your back. ? Your hands should be a little wider than shoulder-width apart. ? Your palms should face away from your back. 2. Keeping your elbows straight and keeping your shoulder muscles relaxed, move the stick away from your body until you feel a stretch in your shoulder. ? Avoid shrugging your shoulders while you move the stick. Keep your shoulder blade tucked down toward the middle of your back. 3. Hold for __________ seconds. 4. Slowly return to  the starting position. Repeat __________ times. Complete this exercise __________ times a day. Strengthening Exercises           These exercises build strength and endurance in your shoulder. Endurance is the ability to use your muscles for a long time, even after they get tired. Exercise G:External Rotation 1. Sit in a stable chair without armrests. 2. Secure an exercise band at elbow height on your left / right side. 3. Place a soft object, such as a folded towel or a small pillow, between your left / right upper arm and your body to move your elbow a few inches away (about 10 cm) from your side. 4. Hold the end of the band so it is tight and there is no slack. 5. Keeping your elbow pressed against the soft object, move your left / right forearm out, away from your abdomen. Keep your body steady so only your forearm moves. 6. Hold for __________ seconds. 7. Slowly return to the starting position. Repeat __________ times. Complete this exercise __________ times a day. Exercise H:Shoulder Abduction 1.  Sit in a stable chair without armrests, or stand. 2. Hold a __________ weight in your left / right hand, or hold an exercise band with both hands. 3. Start with your arms straight down and your left / right palm facing in, toward your body. 4. Slowly lift your left / right hand out to your side. Do not lift your hand above shoulder height unless your health care provider tells you that this is safe. ? Keep your arms straight. ? Avoid shrugging your shoulder while you do this movement. Keep your shoulder blade tucked down toward the middle of your back. 5. Hold for __________ seconds. 6. Slowly lower your arm, and return to the starting position. Repeat __________ times. Complete this exercise __________ times a day. Exercise I:Shoulder Extension 1. Sit in a stable chair without armrests, or stand. 2. Secure an exercise band to a stable object in front of you where it is at shoulder  height. 3. Hold one end of the exercise band in each hand. Your palms should face each other. 4. Straighten your elbows and lift your hands up to shoulder height. 5. Step back, away from the secured end of the exercise band, until the band is tight and there is no slack. 6. Squeeze your shoulder blades together as you pull your hands down to the sides of your thighs. Stop when your hands are straight down by your sides. Do not let your hands go behind your body. 7. Hold for __________ seconds. 8. Slowly return to the starting position. Repeat __________ times. Complete this exercise __________ times a day. Exercise J:Standing Shoulder Row 1. Sit in a stable chair without armrests, or stand. 2. Secure an exercise band to a stable object in front of you so it is at waist height. 3. Hold one end of the exercise band in each hand. Your palms should be in a thumbs-up position. 4. Bend each of your elbows to an "L" shape (about 90 degrees) and keep your upper arms at your sides. 5. Step back until the band is tight and there is no slack. 6. Slowly pull your elbows back behind you. 7. Hold for __________ seconds. 8. Slowly return to the starting position. Repeat __________ times. Complete this exercise __________ times a day. Exercise K:Shoulder Press-Ups 1. Sit in a stable chair that has armrests. Sit upright, with your feet flat on the floor. 2. Put your hands on the armrests so your elbows are bent and your fingers are pointing forward. Your hands should be about even with the sides of your body. 3. Push down on the armrests and use your arms to lift yourself off of the chair. Straighten your elbows and lift yourself up as much as you comfortably can. ? Move your shoulder blades down, and avoid letting your shoulders move up toward your ears. ? Keep your feet on the ground. As you get stronger, your feet should support less of your body weight as you lift yourself up. 4. Hold for __________  seconds. 5. Slowly lower yourself back into the chair. Repeat __________ times. Complete this exercise __________ times a day. Exercise L: Wall Push-Ups 1. Stand so you are facing a stable wall. Your feet should be about one arm-length away from the wall. 2. Lean forward and place your palms on the wall at shoulder height. 3. Keep your feet flat on the floor as you bend your elbows and lean forward toward the wall. 4. Hold for __________ seconds. 5. Straighten your elbows  to push yourself back to the starting position. Repeat __________ times. Complete this exercise __________ times a day. This information is not intended to replace advice given to you by your health care provider. Make sure you discuss any questions you have with your health care provider. Document Released: 07/01/2005 Document Revised: 12/21/2017 Document Reviewed: 04/28/2015 Elsevier Interactive Patient Education  2019 [employer].

## 2018-09-12 NOTE — Progress Notes (Signed)
Assessment and Plan:  Maureen [name] was seen today for shoulder pain.  Diagnoses and all orders for this visit:  Acute pain of left shoulder/? L shoulder tendinitis Intermittent symptoms, progressing, NSAIDs were helping but stopped per GI instructions. duexis samples provided, can try if GI clears ROM intact, no palpable abnormality, will try taper of oral steroids, rest, suggested ice application and monitor for 2 weeks. Shoulder exercises/ROM provided on AVS to try if improving. If persisting, recommended she follow up with established ortho.  -     predniSONE (DELTASONE) 20 MG tablet; 2 tablets daily for 3 days, 1 tablet daily for 4 days.  Further disposition pending results of labs. Discussed med's effects and SE's.   Over 15 minutes of exam, counseling, chart review, and critical decision making was performed.   Future Appointments  Date Time Provider [place]  09/27/2018  4:30 PM [name], MD [facility]  11/29/2018  4:00 PM [name], PA-C [facility] None  03/30/2019  4:00 PM [name], PA-C [facility] None  07/06/2019  9:00 AM [name], PA-C [facility] None    ------------------------------------------------------------------------------------------------------------------   HPI BP 120/82   Pulse 64   Temp 97.8 F (36.6 C)   Ht 5\' 1"  (1.549 m)   Wt 240 lb 3.2 oz (109 kg)   BMI 45.39 kg/m   63 y.o.female presents for evaluation of L shoulder pain; she reports this began 2-3 months ago, gradual onset, began as ache in her deltoid intermittently, and has gradually "moved up" through her shoulder, neck, and now occasionally up to her ear. She reports this is still intermittent, she has increasing episodes/days of pain. She reports overall getting worse. She reports at worse is 8/10, doesn't last at this level, will start to improve/aching will stop spontaneously. She has tried tylenol, was taking diclofenac via ortho,stopped last week , icee-hot  (helped with soreness but not aching). She denies any recent falls or hx of trauma to the shoulder.   She is a side sleeper, tends to sleep on R side. If she sleeps on L will wake her up.  Towards the end of today's visit she recalls this seemed to start about a month after she had her second knee replacement and was doing a lot of strength training and lifting around that time.   Past Medical History:  Diagnosis Date  . Arthritis    LEFT KNEE, LOWER BACK  . GERD (gastroesophageal reflux disease)   . History of hiatal hernia   . History of obstructive sleep apnea    per pt history osa w/ cpap until wt loss after gastric band, retested negative for sleep apnea  . Hypertension   . IBS (irritable bowel syndrome)   . Numbness    BOTTOM LEFT FOOT  . PONV (postoperative nausea and vomiting)    PONV with gallbladder   . Seasonal allergies   . Sinus drainage   . Snores    WEARS NIGHT GUARD  . SUI (stress urinary incontinence, female)      Allergies  Allergen Reactions  . Mucinex [Guaifenesin Er] Other (See Comments)    "feels weird and dizzy"  . Tramadol Itching    Current Outpatient Medications on File Prior to Visit  Medication Sig  . acetaminophen (TYLENOL) 500 MG tablet Take 1,000 mg by mouth every 6 (six) hours as needed for moderate pain or headache.  . Cholecalciferol (VITAMIN D-3) 5000 units TABS Take 5,000 Units by mouth daily.  . Coenzyme Q10 (CO Q10 PO)  Take 1 capsule by mouth daily.  . Cyanocobalamin (VITAMIN B-12 PO) Take 1 tablet by mouth daily.  Marland Kitchen GLUCOSAMINE HCL PO Take 1 capsule by mouth daily.  . hydrochlorothiazide (HYDRODIURIL) 12.5 MG tablet TAKE 1 TABLET BY MOUTH DAILY  . lisinopril (PRINIVIL,ZESTRIL) 40 MG tablet TAKE 1 TABLET BY MOUTH DAILY  . loratadine (CLARITIN) 10 MG tablet Take 10 mg by mouth every morning.   . Multiple Vitamin (MULTIVITAMIN) capsule Take 1 capsule by mouth daily.   Marland Kitchen omeprazole (PRILOSEC) 20 MG capsule Take 1 capsule (20 mg total) by  mouth daily. (Patient taking differently: Take 20 mg by mouth every evening. )  . Turmeric 500 MG CAPS Take 1 capsule by mouth daily.  Marland Kitchen venlafaxine XR (EFFEXOR-XR) 75 MG 24 hr capsule Take 1 capsule at Bedtime   No current facility-administered medications on file prior to visit.     ROS: all negative except above.   Physical Exam:  BP 120/82   Pulse 64   Temp 97.8 F (36.6 C)   Ht 5\' 1"  (1.549 m)   Wt 240 lb 3.2 oz (109 kg)   BMI 45.39 kg/m   General Appearance: Well nourished, in no apparent distress. Eyes: PERRLA, conjunctiva no swelling or erythema ENT/Mouth: Ext aud canals clear, TMs without erythema, bulging. No erythema, swelling, or exudate on post pharynx.  Tonsils not swollen or erythematous. Hearing normal. No TMJ tenderness/clicking or dislocation.  Neck: Supple, thyroid normal.  Respiratory: Respiratory effort normal, BS equal bilaterally without rales, rhonchi, wheezing or stridor.  Cardio: RRR with no MRGs. Brisk peripheral pulses without edema.  Lymphatics: Non tender without lymphadenopathy.  Musculoskeletal:  Left non-specific diffuse tenderness about the shoulder, negative for tenderness about the glenohumeral joint, negative for tenderness over the acromioclavicular joint, negative for impingement sign, sensory exam normal and radial pulse intact. Tenderness in deltoid/lateral shoulder near insertion. Biceps tendon non-tender.  Strength intact and symmetrical except with anterior abduction due to pain.  Skin: Warm, dry without rashes, lesions, ecchymosis.  Neuro:  Normal muscle tone, Sensation intact.  Psych: Awake and oriented X 3, normal affect, Insight and Judgment appropriate.     Izora Ribas, NP 12:02 PM [facility]

## 2018-09-13 ENCOUNTER — Ambulatory Visit: Payer: Self-pay | Admitting: Adult Health

## 2018-09-16 ENCOUNTER — Telehealth: Payer: Self-pay

## 2018-09-16 ENCOUNTER — Other Ambulatory Visit: Payer: Self-pay | Admitting: Adult Health

## 2018-09-16 DIAGNOSIS — M25512 Pain in left shoulder: Secondary | ICD-10-CM

## 2018-09-16 NOTE — Telephone Encounter (Signed)
Prednisone has helped a little, but, it still isn't better, would like to proceed with Orthopedics. Please put in referral.

## 2018-09-27 ENCOUNTER — Encounter: Payer: Self-pay | Admitting: Obstetrics & Gynecology

## 2018-10-07 ENCOUNTER — Ambulatory Visit (INDEPENDENT_AMBULATORY_CARE_PROVIDER_SITE_OTHER): Payer: Managed Care, Other (non HMO) | Admitting: Obstetrics & Gynecology

## 2018-10-07 ENCOUNTER — Encounter: Payer: Self-pay | Admitting: Obstetrics & Gynecology

## 2018-10-07 VITALS — BP 132/86 | Ht 61.0 in | Wt 245.8 lb

## 2018-10-07 DIAGNOSIS — Z780 Asymptomatic menopausal state: Secondary | ICD-10-CM | POA: Diagnosis not present

## 2018-10-07 DIAGNOSIS — Z13820 Encounter for screening for osteoporosis: Secondary | ICD-10-CM

## 2018-10-07 DIAGNOSIS — Z6841 Body Mass Index (BMI) 40.0 and over, adult: Secondary | ICD-10-CM

## 2018-10-07 DIAGNOSIS — Z01419 Encounter for gynecological examination (general) (routine) without abnormal findings: Secondary | ICD-10-CM | POA: Diagnosis not present

## 2018-10-07 NOTE — Progress Notes (Signed)
Maureen [name] [birth year] [id number]   History:    63 y.o. G3P1A2L1 Married  RP: Established patient presenting for annual gyn exam   HPI: Menopause, well on no hormone replacement therapy.  No postmenopausal bleeding.  No pelvic pain.  Abstinent.  Breast normal.  Body mass index 46.44.  Health labs with family physician.  Has had a repeat left knee replacement with a different orthopedist because of malalignment of the leg on the first right knee replacement.  Planning to resume fitness activities.  Colonoscopy scheduled in March 2020.  Past medical history,surgical history, family history and social history were all reviewed and documented in the EPIC chart.  Gynecologic History No LMP recorded. Patient is postmenopausal. Contraception: post menopausal status Last Pap: [facility], normal per patient.  Will obtain record. Last mammogram: 12/2017.  Results were: Negative Bone Density: 2010.  Will schedule here now Colonoscopy: Scheduled 10/2018  Obstetric History OB History  Gravida Para Term Preterm AB Living  3       2 1   SAB TAB Ectopic Multiple Live Births  2       1    # Outcome Date GA Lbr Len/2nd Weight Sex Delivery Anes PTL Lv  3 Gravida           2 SAB           1 SAB              ROS: A ROS was performed and pertinent positives and negatives are included in the history.  GENERAL: No fevers or chills. HEENT: No change in vision, no earache, sore throat or sinus congestion. NECK: No pain or stiffness. CARDIOVASCULAR: No chest pain or pressure. No palpitations. PULMONARY: No shortness of breath, cough or wheeze. GASTROINTESTINAL: No abdominal pain, nausea, vomiting or diarrhea, melena or bright red blood per rectum. GENITOURINARY: No urinary frequency, urgency, hesitancy or dysuria. MUSCULOSKELETAL: No joint or muscle pain, no back pain, no recent trauma. DERMATOLOGIC: No rash, no itching, no lesions. ENDOCRINE: No polyuria, polydipsia, no heat or cold intolerance. No  recent change in weight. HEMATOLOGICAL: No anemia or easy bruising or bleeding. NEUROLOGIC: No headache, seizures, numbness, tingling or weakness. PSYCHIATRIC: No depression, no loss of interest in normal activity or change in sleep pattern.     Exam:   BP 132/86   Ht 5\' 1"  (1.549 m)   Wt 245 lb 12.8 oz (111.5 kg)   BMI 46.44 kg/m   Body mass index is 46.44 kg/m.  General appearance : Well developed well nourished female. No acute distress HEENT: Eyes: no retinal hemorrhage or exudates,  Neck supple, trachea midline, no carotid bruits, no thyroidmegaly Lungs: Clear to auscultation, no rhonchi or wheezes, or rib retractions  Heart: Regular rate and rhythm, no murmurs or gallops Breast:Examined in sitting and supine position were symmetrical in appearance, no palpable masses or tenderness,  no skin retraction, no nipple inversion, no nipple discharge, no skin discoloration, no axillary or supraclavicular lymphadenopathy Abdomen: no palpable masses or tenderness, no rebound or guarding Extremities: no edema or skin discoloration or tenderness  Pelvic: Vulva: Normal             Vagina: No gross lesions or discharge  Cervix: No gross lesions or discharge.  Pap reflex done.  Uterus  AV, normal size, shape and consistency, non-tender and mobile  Adnexa  Without masses or tenderness  Anus: Normal   Assessment/Plan:  63 y.o. female for annual exam   1. Encounter  for routine gynecological examination with Papanicolaou smear of cervix Normal gynecologic exam in menopause.  Pap reflex done today.  Breast exam normal.  Last screening mammogram in May 2019 was negative.  Colonoscopy scheduled March 2020.  Health labs with family physician. - Pap IG w/ reflex to HPV when ASC-U  2. Postmenopausal Well on no hormone replacement therapy.  No postmenopausal bleeding.  3. Screening for osteoporosis Schedule bone density here now.  Vitamin D supplements and calcium intake of 1.5 g/day  recommended.  Weightbearing physical activities on a regular basis.  4. Class 3 severe obesity due to excess calories without serious comorbidity with body mass index (BMI) of 45.0 to 49.9 in adult [facility]) Recommend a low calorie/carb diet such as [employer].  Aerobic physical activities 5 times a week, could do swimming given knee replacement.  Weightlifting every 2 days.  [name] MD, 4:50 PM 10/07/2018

## 2018-10-07 NOTE — Patient Instructions (Signed)
  1. Encounter for routine gynecological examination with Papanicolaou smear of cervix Normal gynecologic exam in menopause.  Pap reflex done today.  Breast exam normal.  Last screening mammogram in May 2019 was negative.  Colonoscopy scheduled March 2020.  Health labs with family physician. - Pap IG w/ reflex to HPV when ASC-U  2. Postmenopausal Well on no hormone replacement therapy.  No postmenopausal bleeding.  3. Screening for osteoporosis Schedule bone density here now.  Vitamin D supplements and calcium intake of 1.5 g/day recommended.  Weightbearing physical activities on a regular basis.  4. Class 3 severe obesity due to excess calories without serious comorbidity with body mass index (BMI) of 45.0 to 49.9 in adult [facility]) Recommend a low calorie/carb diet such as [employer].  Aerobic physical activities 5 times a week, could do swimming given knee replacement.  Weightlifting every 2 days.  Mylia, it was a pleasure seeing you today!  I will inform you of your results as soon as they are available.

## 2018-10-13 ENCOUNTER — Other Ambulatory Visit: Payer: Self-pay | Admitting: Internal Medicine

## 2018-10-13 LAB — PAP IG W/ RFLX HPV ASCU: PAP Smear Comment: 0

## 2018-10-17 DIAGNOSIS — M25512 Pain in left shoulder: Secondary | ICD-10-CM | POA: Diagnosis not present

## 2018-10-17 DIAGNOSIS — M7542 Impingement syndrome of left shoulder: Secondary | ICD-10-CM | POA: Diagnosis not present

## 2018-10-24 ENCOUNTER — Other Ambulatory Visit: Payer: Self-pay | Admitting: Internal Medicine

## 2018-10-27 DIAGNOSIS — M25562 Pain in left knee: Secondary | ICD-10-CM | POA: Diagnosis not present

## 2018-11-24 ENCOUNTER — Other Ambulatory Visit: Payer: Self-pay | Admitting: *Deleted

## 2018-11-24 MED ORDER — VENLAFAXINE HCL ER 75 MG PO CP24: ORAL_CAPSULE | ORAL | 1 refills | Status: DC

## 2018-11-28 NOTE — Progress Notes (Signed)
Virtual Visit via Video Note  I connected with Maureen [name] on 11/29/18 at 11:30 AM EDT by a video enabled telemedicine application and verified that I am speaking with the correct person using two identifiers.   I discussed the limitations of evaluation and management by telemedicine and the availability of in person appointments. The patient expressed understanding and agreed to proceed.    Maureen Mutters, PA-C    Assessment and Plan:   Hypertension -Continue medication, monitor blood pressure at home. Continue DASH diet.  Reminder to go to the ER if any CP, SOB, nausea, dizziness, severe HA, changes vision/speech, left arm numbness and tingling and jaw pain.   Cholesterol -Continue diet and exercise. Check cholesterol.    Vitamin D Def - check level and continue medications.    Obesity with co morbidities - long discussion about weight loss, diet, and exercise   Continue diet and meds as discussed. Further disposition pending results of labs Over 30 minutes of exam, counseling, chart review, and critical decision making was performed  Future Appointments  Date Time Provider [place]  11/29/2018 11:30 AM Maureen Mutters, PA-C [facility] None  12/13/2018  4:00 PM [facility] BONE DENSITY RM [facility] None  03/30/2019  4:00 PM Maureen Mutters, PA-C [facility] None  07/06/2019  9:00 AM Maureen Mutters, PA-C [facility] None  10/09/2019  4:00 PM Maureen Bruins, MD [facility]    Follow Up Instructions:    I discussed the assessment and treatment plan with the patient. The patient was provided an opportunity to ask questions and all were answered. The patient agreed with the plan and demonstrated an understanding of the instructions.   The patient was advised to call back or seek an in-person evaluation if the symptoms worsen or if the condition fails to improve as anticipated.  I provided 30+ minutes of non-face-to-face time during this encounter.   HPI 63 y.o.  female  presents for 3 month follow up on hypertension, cholesterol, prediabetes, and vitamin D deficiency.   She is on dayquil, will send in flonase or nasonex  Has been on effexor for stomach issues and hot flashes, would like to get off of it eventually.   She is working from home. Has been working in the years.    Her blood pressure has been controlled at home, today their BP is    She does workout. She denies chest pain, shortness of breath, dizziness.  She is not on cholesterol medication and denies myalgias. Her cholesterol is at goal. The cholesterol last visit was:   Lab Results  Component Value Date   CHOL 194 06/27/2018   HDL 57 06/27/2018   LDLCALC 85 06/27/2018   TRIG 262 (H) 06/27/2018    She has been working on diet and exercise for prediabetes, and denies paresthesia of the feet, polydipsia, polyuria and visual disturbances. Last A1C in the office was:  Lab Results  Component Value Date   HGBA1C 5.4 01/29/2017   Patient is on Vitamin D supplement.   Lab Results  Component Value Date   VD25OH 46.1 06/27/2018     BMI is Body mass index is 46.44 kg/m., she is working on diet and exercise. Patient has lap band. 241 at home Wt Readings from Last 3 Encounters:  10/07/18 245 lb 12.8 oz (111.5 kg)  09/12/18 240 lb 3.2 oz (109 kg)  06/27/18 239 lb (108.4 kg)    Current Medications:  Current Outpatient Medications on File Prior to Visit  Medication Sig Dispense  Refill  . acetaminophen (TYLENOL) 500 MG tablet Take 1,000 mg by mouth every 6 (six) hours as needed for moderate pain or headache.    . Cholecalciferol (VITAMIN D-3) 5000 units TABS Take 5,000 Units by mouth daily.    . Coenzyme Q10 (CO Q10 PO) Take 1 capsule by mouth daily.    . Cyanocobalamin (VITAMIN B-12 PO) Take 1 tablet by mouth daily.    Marland Kitchen GLUCOSAMINE HCL PO Take 1 capsule by mouth daily.    . hydrochlorothiazide (HYDRODIURIL) 12.5 MG tablet TAKE ONE TABLET EVERY DAY 90 tablet 1  . lisinopril  (PRINIVIL,ZESTRIL) 40 MG tablet TAKE ONE TABLET EVERY DAY 90 tablet 0  . loratadine (CLARITIN) 10 MG tablet Take 10 mg by mouth every morning.     . Multiple Vitamin (MULTIVITAMIN) capsule Take 1 capsule by mouth daily.     Marland Kitchen omeprazole (PRILOSEC) 20 MG capsule Take 1 capsule (20 mg total) by mouth daily. (Patient taking differently: Take 20 mg by mouth every evening. ) 90 capsule 1  . Turmeric 500 MG CAPS Take 1 capsule by mouth daily.    Marland Kitchen venlafaxine XR (EFFEXOR-XR) 75 MG 24 hr capsule Take 1 capsule at Bedtime 90 capsule 1   No current facility-administered medications on file prior to visit.    Medical History:  Past Medical History:  Diagnosis Date  . Arthritis    LEFT KNEE, LOWER BACK  . GERD (gastroesophageal reflux disease)   . History of hiatal hernia   . History of obstructive sleep apnea    per pt history osa w/ cpap until wt loss after gastric band, retested negative for sleep apnea  . Hypertension   . IBS (irritable bowel syndrome)   . Numbness    BOTTOM LEFT FOOT  . PONV (postoperative nausea and vomiting)    PONV with gallbladder   . Seasonal allergies   . Sinus drainage   . Snores    WEARS NIGHT GUARD  . SUI (stress urinary incontinence, female)    Allergies:  Allergies  Allergen Reactions  . Mucinex [Guaifenesin Er] Other (See Comments)    "feels weird and dizzy"  . Tramadol Itching     Review of Systems:  Review of Systems  Constitutional: Negative.  Negative for chills and fever.  HENT: Negative.   Eyes: Negative.   Respiratory: Negative.   Cardiovascular: Negative.   Gastrointestinal: Negative.   Genitourinary: Negative.  Negative for frequency, hematuria and urgency.  Musculoskeletal: Positive for back pain. Negative for falls, joint pain, myalgias and neck pain.  Skin: Negative.   Neurological: Positive for sensory change. Negative for dizziness, tingling, tremors, speech change, focal weakness, seizures and loss of consciousness.   Endo/Heme/Allergies: Negative.   Psychiatric/Behavioral: Negative.     Family history- Review and unchanged Social history- Review and unchanged Physical Exam: Ht 5\' 1"  (1.549 m)   BMI 46.44 kg/m  Wt Readings from Last 3 Encounters:  10/07/18 245 lb 12.8 oz (111.5 kg)  09/12/18 240 lb 3.2 oz (109 kg)  06/27/18 239 lb (108.4 kg)   General Appearance:, in no apparent distress.  ENT/Mouth: No hoarseness, No cough for duration of visit.  Respiratory: completing full sentences without distress on the phone   Neuro: Awake and oriented X 3,  Psych:  Insight and Judgment appropriate.    Maureen Mutters, PA-C 11:26 AM [facility]

## 2018-11-29 ENCOUNTER — Ambulatory Visit: Payer: Self-pay | Admitting: Physician Assistant

## 2018-11-29 ENCOUNTER — Encounter: Payer: Self-pay | Admitting: Physician Assistant

## 2018-11-29 ENCOUNTER — Ambulatory Visit: Payer: Managed Care, Other (non HMO) | Admitting: Physician Assistant

## 2018-11-29 VITALS — Ht 61.0 in | Wt 241.0 lb

## 2018-11-29 DIAGNOSIS — I10 Essential (primary) hypertension: Secondary | ICD-10-CM

## 2018-11-29 DIAGNOSIS — E785 Hyperlipidemia, unspecified: Secondary | ICD-10-CM | POA: Diagnosis not present

## 2018-11-29 DIAGNOSIS — Z79899 Other long term (current) drug therapy: Secondary | ICD-10-CM | POA: Diagnosis not present

## 2018-11-29 MED ORDER — TRIAMCINOLONE ACETONIDE 55 MCG/ACT NA AERO: 2.0000 | INHALATION_SPRAY | Freq: Every day | NASAL | 3 refills | Status: DC

## 2018-11-29 NOTE — Patient Instructions (Signed)
Can do a steroid nasal spary 1-2 sparys at night each nostril.  Remember to spray each nostril twice towards the outer part of your eye.   Do not sniff but instead pinch your nose and tilt your head back to help the medicine get into your sinuses.   The best time to do this is at bedtime.  Stop if you get blurred vision or nose bleeds.   THIS WILL TAKE 7 DAYS TO WORK AND IS BETTER IF YOU START BEFORE SYMPTOMS SO IF YOU HAVE A SEASON OR TIME OF THE YEAR YOU ALWAYS GET A COLD, START BEFORE THAT!    GENERAL HEALTH GOALS  Know what a healthy weight is for you (roughly BMI <25) and aim to maintain this  Aim for 7+ servings of fruits and vegetables daily  70-80+ fluid ounces of water or unsweet tea for healthy kidneys  Limit to max 1 drink of alcohol per day; avoid smoking/tobacco  Limit animal fats in diet for cholesterol and heart health - choose grass fed whenever available  Avoid highly processed foods, and foods high in saturated/trans fats  Aim for low stress - take time to unwind and care for your mental health  Aim for 150 min of moderate intensity exercise weekly for heart health, and weights twice weekly for bone health  Aim for 7-9 hours of sleep daily   AN UPDATE FROM [facility]  At this time we are STILL NOT accepting walk in appointments or sick visits.  We are still having asymptomatic patients come into the office however we are asking that you call the office from your car to notify that you are here and we can check you into your appointment via the telephone.  We will have a nurse call you when she is ready for you to come into the office, this way we will not have patients waiting in the waiting room and can be more efficient.   As a community we are no longer testing patients with mild symptoms.  If the symptoms are mild, we are NOT testing patients to conserve supplies and capacity so our health care workers can care for  people who need medical attention even during the peak of the outbreak.  Patients with mild symptoms are being instructed to stay at home and recover for 2 weeks.  You can stay in contact with [place] during that time by mychart, telephone calls or we are starting virtual office visits with video capability.   Mild symptoms include cough and fever WITHOUT any of the following symptoms: Difficulty breathing, chest discomfort, altered thinking and confusion.  Please notify us immediately if you have these symptoms.   We have also decided NOT to do testing in our office for coronavirus at this time though we are continually accessing the situation as needed and will notify you if this changes.    Virtual visits and Evisits For your convenience and to prevent transmission, we are now performing Evisits or telephone encounters for virtual visits for sick visits and regular scheduled follow up visits.  You can contact one of your providers through [place], your patient portal at any time or call the office during business hours.   Medicare and insurances are covering this during this crisis.  This helps you because we are able to talk about your conditions, discuss things to monitor and do; in addition, it also helps [place] as a practice. We are a [employer] and need  our patients help to remain open by doing these visits.  So please consider doing a virtual visit rather than rescheduling completely!  PLEASE DO NOT CLICK THE EVISIT BUTTON ON MY CHART, INSTEAD CHOOSE TO MESSAGE YOUR PROVIDER AND SEND [place] A MESSAGE WITH YOUR ISSUES.   If after a mychart communication or telephone visit, we feel you need testing then we will put in an order for you to get tested at a specimen collection site through [facility].   We will not test you if you do not have symptoms or have not had potential contact at this time.  We will not test you if you just show up to the office, you need an appointment  specifically for testing, please call or message first.   Please remember that this virus is happening at the same time as other respiratory viruses, such as influenza, common colds and now allergy season.   To reduce your risk of infection we recommend the same precautions as those used to avoid the common cold and flu virus  . Please wash your hands with soap and water for 20 seconds and frequently. Wendee Copp your hands before you eat or touch your face.  . Avoid touching your face, eyes, nose, and mouth as much as possible.  . if needing to cough or sneeze cover your mouth and nose by coughing or sneezing into your elbow area, your sleeve or a tissue . Routinely clean frequently touched items such as doorknobs, keyboards, and phones.  . Avoid crowds of people.  Marland Kitchen avoid shaking hands with others; . We recommend anyone within the high-risk demographics such as older adults and those with heart/lung disease, auto-immune or immune-suppressing health conditions to consider reduced travel and public outings.  Please refer to the sources below for current updates, guidelines and recommendations.  You can call this hotline set up by [facility] 1 877 40COVID [phone])  You can visit these websites: CDC.gov WHO.int  Please visit the [place] webpage for the latest health notices and updates on local and international travel.  What are the symptoms? There are many different symptoms reported HOWEVER the most common associated with the virus at this time is FEVER, DRY COUGH, and SHORTNESS OF BREATH.   What to do if you have symptoms or you feel that you have come in contact with someone that may be infected with coronavirus? Please stay at home.  Call our office or send a mychart message.  Please remember the majority of cases are self-limited and do not require in hospital intervention.  There is no specific treatment for a mild case of the virus other than supportive care.   Please only go to the ER if your symptoms escalate such as worsening shortness of breath, chest pain, and confusion.   Do you need a mask? You do not need a mask if you do not have symptoms. If you have a fever, cough, or shortness of breath please wear a mask to your appointment if you have one or we will provide one for you.

## 2018-11-30 ENCOUNTER — Other Ambulatory Visit: Payer: Self-pay

## 2019-01-20 ENCOUNTER — Other Ambulatory Visit: Payer: Self-pay | Admitting: Adult Health

## 2019-03-30 ENCOUNTER — Encounter: Payer: Self-pay | Admitting: Physician Assistant

## 2019-03-30 ENCOUNTER — Other Ambulatory Visit: Payer: Self-pay

## 2019-03-30 ENCOUNTER — Ambulatory Visit (INDEPENDENT_AMBULATORY_CARE_PROVIDER_SITE_OTHER): Payer: Managed Care, Other (non HMO) | Admitting: Physician Assistant

## 2019-03-30 VITALS — BP 128/88 | HR 88 | Temp 97.7°F | Wt 248.6 lb

## 2019-03-30 DIAGNOSIS — E785 Hyperlipidemia, unspecified: Secondary | ICD-10-CM | POA: Diagnosis not present

## 2019-03-30 DIAGNOSIS — Z131 Encounter for screening for diabetes mellitus: Secondary | ICD-10-CM

## 2019-03-30 DIAGNOSIS — I10 Essential (primary) hypertension: Secondary | ICD-10-CM | POA: Diagnosis not present

## 2019-03-30 DIAGNOSIS — Z79899 Other long term (current) drug therapy: Secondary | ICD-10-CM | POA: Diagnosis not present

## 2019-03-30 DIAGNOSIS — M2560 Stiffness of unspecified joint, not elsewhere classified: Secondary | ICD-10-CM

## 2019-03-30 DIAGNOSIS — E559 Vitamin D deficiency, unspecified: Secondary | ICD-10-CM

## 2019-03-30 MED ORDER — DICLOFENAC SODIUM 1 % TD GEL: 4.0000 g | Freq: Four times a day (QID) | TRANSDERMAL | 3 refills | Status: DC

## 2019-03-30 NOTE — Patient Instructions (Addendum)
Try the voltern gel  I want to challenge you to lose 10 lbs before the next appointment.  That would be about 3 lbs a month!  This is very achievable and I know you can do it.  10 lbs is actually 40 lbs of pressure of your joints, back, hips, knees and ankles.  10lbs may be enough to help your blood pressure and cholesterol for next visit.    Meralgia paresthetica  Overview Meralgia paresthetica is a condition characterized by tingling, numbness and burning pain in your outer thigh. The cause of meralgia paresthetica is compression of the nerve that supplies sensation to the skin surface of your thigh. Tight clothing, obesity or weight gain, and pregnancy are common causes of meralgia paresthetica. However, meralgia paresthetica can also be due to local trauma or a disease, such as diabetes.  In most cases, you can relieve meralgia paresthetica with conservative measures, such as wearing looser clothing. In severe cases, treatment may include medications to relieve discomfort or, rarely, surgery.  Symptoms Pressure on the lateral femoral cutaneous nerve, which supplies sensation to your upper thigh, might cause these symptoms of meralgia paresthetica: Tingling and numbness in the outer part of your thigh Burning pain on the surface of the outer part of your thigh These symptoms commonly occur on one side of your body and might intensify after walking or standing.  Causes Meralgia paresthetica occurs when the lateral femoral cutaneous nerve - which supplies sensation to the surface of your outer thigh - becomes compressed, or pinched. The lateral femoral cutaneous nerve is purely a sensory nerve and doesn't affect your ability to use your leg muscles. In most people, this nerve passes through the groin to the upper thigh without trouble. But in meralgia paresthetica, the lateral femoral cutaneous nerve becomes trapped - often under the inguinal ligament, which runs along your groin from your  abdomen to your upper thigh.  Common causes of this compression include any condition that increases pressure on the groin, including: Tight clothing, such as belts, corsets and tight pants Obesity or weight gain Wearing a heavy tool belt Pregnancy Scar tissue near the inguinal ligament due to injury or past surgery Nerve injury, which can be due to diabetes or seat belt injury after a motor vehicle accident, for example, also can cause meralgia paresthetica.  Risk factors The following might increase your risk of meralgia paresthetica: Extra weight. Being overweight or obese can increase the pressure on your lateral femoral cutaneous nerve. Pregnancy. A growing belly puts added pressure on your groin, through which the lateral femoral cutaneous nerve passes. Diabetes. Diabetes-related nerve injury can lead to meralgia paresthetica. Age. People between the ages of 8 and 11 are at a higher risk.  Diagnosis In most cases, your doctor can make a diagnosis of meralgia paresthetica based on your medical history and a physical exam. He or she might test the sensation of the affected thigh, ask you to describe the pain, and ask you to trace the numb or painful area on your thigh. Additional examination including strength testing and reflex testing might be done to help exclude other causes for the symptoms.  To rule out other conditions, your doctor might recommend: Imaging studies. Although no specific changes are evident on X-ray if you have meralgia paresthetica, images of your hip and pelvic area might be helpful to exclude other conditions as a cause of your symptoms. If your doctor suspects a tumor could be causing your pain, he or she might  order a CT scan or MRI. Electromyography. This test measures the electrical discharges produced in muscles to help evaluate and diagnose muscle and nerve disorders. A thin needle electrode is placed into the muscle to record electrical activity. Results of  this test are normal in meralgia paresthetica, but the test might be needed to exclude other disorders when the diagnosis isn't clear. Nerve conduction study. Patch-style electrodes are placed on your skin to stimulate the nerve with a mild electrical impulse. The electrical impulse helps diagnose damaged nerves. This test might be done primarily to exclude other causes for the symptoms.  Nerve blockade. Pain relief achieved from anesthetic injection into your thigh where the lateral femoral cutaneous nerve enters into it can confirm that you have meralgia paresthetica. Ultrasound imaging might be used to guide the needle.  Treatment For most people, the symptoms of meralgia paresthetica ease in a few months. Treatment focuses on relieving nerve compression.  Conservative measures include: Wearing looser clothing Losing excess weight Taking OTC pain relievers such as acetaminophen (Tylenol, others), ibruprofen (Advil, Motrin IB, others) or aspirin  Medications If symptoms persist for more than two months or your pain is severe, treatment might include: Corticosteroid injections. Injections can reduce inflammation and temporarily relieve pain. Possible side effects include joint infection, nerve damage, pain and whitening of skin around the injection site. Tricyclic antidepressants. These medications might relieve your pain. Side effects include drowsiness, dry mouth, constipation and impaired sexual functioning. Gabapentin (Gralise, Neurontin), phenytoin (Dilantin) or pregabalin (Lyrica). These anti-seizure medications might help lessen your painful symptoms. Side effects include constipation, nausea, dizziness, drowsiness and lightheadedness.  Surgery Rarely, surgery to decompress the nerve is considered. This option is only for people with severe and long-lasting symptoms.  Lifestyle and home remedies The following self-care measures can help treat and prevent meralgia paresthetica: Avoid  wearing tight clothing. Maintain a healthy weight, or lose weight if you're overweight.      When it comes to diets, agreement about the perfect plan isn't easy to find, even among the experts. Experts at the [employer] developed an idea known as the Healthy Eating Plate. Just imagine a plate divided into logical, healthy portions.  The emphasis is on diet quality:  Load up on vegetables and fruits - one-half of your plate: Aim for color and variety, and remember that potatoes don't count.  Go for whole grains - one-quarter of your plate: Whole wheat, barley, wheat berries, quinoa, oats, brown rice, and foods made with them. If you want pasta, go with whole wheat pasta.  Protein power - one-quarter of your plate: Fish, chicken, beans, and nuts are all healthy, versatile protein sources. Limit red meat.  The diet, however, does go beyond the plate, offering a few other suggestions.  Use healthy plant oils, such as olive, canola, soy, corn, sunflower and peanut. Check the labels, and avoid partially hydrogenated oil, which have unhealthy trans fats.  If you're thirsty, drink water. Coffee and tea are good in moderation, but skip sugary drinks and limit milk and dairy products to one or two daily servings.  The type of carbohydrate in the diet is more important than the amount. Some sources of carbohydrates, such as vegetables, fruits, whole grains, and beans-are healthier than others.  Finally, stay active.

## 2019-03-30 NOTE — Progress Notes (Signed)
Assessment and Plan:   Hypertension -Continue medication, monitor blood pressure at home. Continue DASH diet.  Reminder to go to the ER if any CP, SOB, nausea, dizziness, severe HA, changes vision/speech, left arm numbness and tingling and jaw pain.   Cholesterol -Continue diet and exercise. Check cholesterol.    Vitamin D Def - check level and continue medications.    Obesity with co morbidities - long discussion about weight loss, diet, and exercise  Multiple joint stiffness Get labs, rule out autoimmune voltern gel If not helping will refer to ortho Weight loss advised  Continue diet and meds as discussed. Further disposition pending results of labs Over 30 minutes of exam, counseling, chart review, and critical decision making was performed [facility] labs done Future Appointments  Date Time Provider [place]  07/06/2019  9:00 AM [name], PA-C [id number] None  10/09/2019  4:00 PM [name], MD [facility]     HPI 63 y.o. female  presents for 3 month follow up on hypertension, cholesterol, prediabetes, and vitamin D deficiency.   She is on diclofenac daily, she states without it she will be very stiff and have pain in her knees, hips, shoulders. However she has had lap band and has GERD, taking the diclofenac causes bad AB pain, GERD and diarrhea. No family history of autoimmune. Tumeric did not help, glucosamine chondrontin did not help.   BMI is Body mass index is 46.97 kg/m., she is working on diet and exercise.Patient has had lap band repair due to GERD with Dr. Hassell Done in May. She is starting new diet and she has a Engineer, maintenance, starting optovia.  Wt Readings from Last 3 Encounters:  03/30/19 248 lb 9.6 oz (112.8 kg)  11/29/18 241 lb (109.3 kg)  10/07/18 245 lb 12.8 oz (111.5 kg)     Her blood pressure has been controlled at home, today their BP is BP: 128/88  She does workout. She denies chest pain, shortness of breath, dizziness.  She is not  on cholesterol medication and denies myalgias. Her cholesterol is at goal. The cholesterol last visit was:   Lab Results  Component Value Date   CHOL 194 06/27/2018   HDL 57 06/27/2018   LDLCALC 85 06/27/2018   TRIG 262 (H) 06/27/2018    She has been working on diet and exercise for prediabetes, and denies paresthesia of the feet, polydipsia, polyuria and visual disturbances. Last A1C in the office was:  Lab Results  Component Value Date   HGBA1C 5.4 01/29/2017   Patient is on Vitamin D supplement.   Lab Results  Component Value Date   VD25OH 46.1 06/27/2018      Current Medications:  Current Outpatient Medications on File Prior to Visit  Medication Sig Dispense Refill  . acetaminophen (TYLENOL) 500 MG tablet Take 1,000 mg by mouth every 6 (six) hours as needed for moderate pain or headache.    . Cholecalciferol (VITAMIN D-3) 5000 units TABS Take 5,000 Units by mouth daily.    . Coenzyme Q10 (CO Q10 PO) Take 1 capsule by mouth daily.    . Cyanocobalamin (VITAMIN B-12 PO) Take 1 tablet by mouth daily.    Marland Kitchen GLUCOSAMINE HCL PO Take 1 capsule by mouth daily.    . hydrochlorothiazide (HYDRODIURIL) 12.5 MG tablet TAKE ONE TABLET EVERY DAY 90 tablet 1  . lisinopril (PRINIVIL,ZESTRIL) 40 MG tablet TAKE ONE TABLET EVERY DAY 90 tablet 0  . loratadine (CLARITIN) 10 MG tablet Take 10 mg by mouth every  morning.     . Multiple Vitamin (MULTIVITAMIN) capsule Take 1 capsule by mouth daily.     Marland Kitchen omeprazole (PRILOSEC) 20 MG capsule Take 1 capsule Daily for Acid Indigestion & Reflux 90 capsule 3  . triamcinolone (NASACORT) 55 MCG/ACT AERO nasal inhaler Place 2 sprays into the nose at bedtime. 1 Inhaler 3  . Turmeric 500 MG CAPS Take 1 capsule by mouth daily.    Marland Kitchen venlafaxine XR (EFFEXOR-XR) 75 MG 24 hr capsule Take 1 capsule at Bedtime 90 capsule 1   No current facility-administered medications on file prior to visit.    Medical History:  Past Medical History:  Diagnosis Date  . Arthritis     LEFT KNEE, LOWER BACK  . GERD (gastroesophageal reflux disease)   . History of hiatal hernia   . History of obstructive sleep apnea    per pt history osa w/ cpap until wt loss after gastric band, retested negative for sleep apnea  . Hypertension   . IBS (irritable bowel syndrome)   . Numbness    BOTTOM LEFT FOOT  . PONV (postoperative nausea and vomiting)    PONV with gallbladder   . Seasonal allergies   . Sinus drainage   . Snores    WEARS NIGHT GUARD  . SUI (stress urinary incontinence, female)    Allergies:  Allergies  Allergen Reactions  . Mucinex [Guaifenesin Er] Other (See Comments)    "feels weird and dizzy"  . Tramadol Itching     Review of Systems:  Review of Systems  Constitutional: Negative.  Negative for chills and fever.  HENT: Negative.   Eyes: Negative.   Respiratory: Negative.   Cardiovascular: Negative.   Gastrointestinal: Negative.   Genitourinary: Negative.  Negative for frequency, hematuria and urgency.  Musculoskeletal: Positive for back pain. Negative for falls, joint pain, myalgias and neck pain.  Skin: Negative.   Neurological: Positive for tingling and sensory change. Negative for dizziness, tremors, speech change, focal weakness, seizures and loss of consciousness.  Endo/Heme/Allergies: Negative.   Psychiatric/Behavioral: Negative.     Family history- Review and unchanged Social history- Review and unchanged Physical Exam: BP 128/88   Pulse 88   Temp 97.7 F (36.5 C)   Wt 248 lb 9.6 oz (112.8 kg)   SpO2 97%   BMI 46.97 kg/m  Wt Readings from Last 3 Encounters:  03/30/19 248 lb 9.6 oz (112.8 kg)  11/29/18 241 lb (109.3 kg)  10/07/18 245 lb 12.8 oz (111.5 kg)   General Appearance: Well nourished, in no apparent distress. Eyes: PERRLA, EOMs, conjunctiva no swelling or erythema Sinuses: No Frontal/maxillary tenderness ENT/Mouth: Ext aud canals clear, TMs without erythema, bulging. No erythema, swelling, or exudate on post pharynx.   Tonsils not swollen or erythematous. Hearing normal.  Neck: Supple, thyroid normal.  Respiratory: Respiratory effort normal, BS equal bilaterally without rales, rhonchi, wheezing or stridor.  Cardio: RRR with no MRGs. Brisk peripheral pulses without edema.  Abdomen: Soft, obese, + BS,  Non tender, no guarding, rebound, hernias, masses. Lymphatics: Non tender without lymphadenopathy.  Musculoskeletal: Full ROM, 5/5 strength. Left knee with some valgus deformity, well healing scar, no effusion or warmth, no laxity noted. .   Skin: Warm, dry without rashes, lesions, ecchymosis.  Neuro: Cranial nerves intact. Normal muscle tone, no cerebellar symptoms. Psych: Awake and oriented X 3, normal affect, Insight and Judgment appropriate.    [name], PA-C 4:23 PM [facility]

## 2019-03-31 ENCOUNTER — Other Ambulatory Visit: Payer: Self-pay | Admitting: Physician Assistant

## 2019-04-01 LAB — LIPID PANEL W/O CHOL/HDL RATIO
Cholesterol, Total: 202 mg/dL — ABNORMAL HIGH (ref 100–199)
HDL: 47 mg/dL (ref >39)
Triglycerides: 583 mg/dL (ref 0–149)

## 2019-04-01 LAB — CBC WITH DIFFERENTIAL/PLATELET
Basophils Absolute: 0 10*3/uL (ref 0.0–0.2)
Basos: 1 %
EOS (ABSOLUTE): 0.1 10*3/uL (ref 0.0–0.4)
Eos: 1 %
Hematocrit: 40.1 % (ref 34.0–46.6)
Hemoglobin: 13.8 g/dL (ref 11.1–15.9)
Immature Grans (Abs): 0 10*3/uL (ref 0.0–0.1)
Immature Granulocytes: 0 %
Lymphocytes Absolute: 1.8 10*3/uL (ref 0.7–3.1)
Lymphs: 23 %
MCH: 33 pg (ref 26.6–33.0)
MCHC: 34.4 g/dL (ref 31.5–35.7)
MCV: 96 fL (ref 79–97)
Monocytes Absolute: 0.4 10*3/uL (ref 0.1–0.9)
Monocytes: 5 %
Neutrophils Absolute: 5.4 10*3/uL (ref 1.4–7.0)
Neutrophils: 70 %
Platelets: 253 10*3/uL (ref 150–450)
RBC: 4.18 x10E6/uL (ref 3.77–5.28)
RDW: 12.6 % (ref 11.7–15.4)
WBC: 7.8 10*3/uL (ref 3.4–10.8)

## 2019-04-01 LAB — COMPREHENSIVE METABOLIC PANEL
ALT: 22 IU/L (ref 0–32)
AST: 18 IU/L (ref 0–40)
Albumin/Globulin Ratio: 1.8 (ref 1.2–2.2)
Albumin: 4.2 g/dL (ref 3.8–4.8)
Alkaline Phosphatase: 107 IU/L (ref 39–117)
BUN/Creatinine Ratio: 42 — ABNORMAL HIGH (ref 12–28)
BUN: 20 mg/dL (ref 8–27)
Bilirubin Total: 0.2 mg/dL (ref 0.0–1.2)
CO2: 23 mmol/L (ref 20–29)
Calcium: 10 mg/dL (ref 8.7–10.3)
Chloride: 99 mmol/L (ref 96–106)
Creatinine, Ser: 0.48 mg/dL — ABNORMAL LOW (ref 0.57–1.00)
GFR calc Af Amer: 120 mL/min/{1.73_m2} (ref >59)
GFR calc non Af Amer: 104 mL/min/{1.73_m2} (ref >59)
Globulin, Total: 2.4 g/dL (ref 1.5–4.5)
Glucose: 118 mg/dL — ABNORMAL HIGH (ref 65–99)
Potassium: 4.3 mmol/L (ref 3.5–5.2)
Sodium: 140 mmol/L (ref 134–144)
Total Protein: 6.6 g/dL (ref 6.0–8.5)

## 2019-04-01 LAB — ANTIEXTRACTABLE NUCLEAR AG

## 2019-04-01 LAB — HLA-B27 ANTIGEN

## 2019-04-01 LAB — CYCLIC CITRUL PEPTIDE ANTIBODY, IGG/IGA

## 2019-04-01 LAB — SPECIMEN STATUS REPORT

## 2019-04-01 LAB — T4, FREE: Free T4: 0.96 ng/dL (ref 0.82–1.77)

## 2019-04-01 LAB — VITAMIN D 25 HYDROXY (VIT D DEFICIENCY, FRACTURES): Vit D, 25-Hydroxy: 31.7 ng/mL (ref 30.0–100.0)

## 2019-04-01 LAB — ANA

## 2019-04-01 LAB — SEDIMENTATION RATE: Sed Rate: 7 mm/hr (ref 0–40)

## 2019-04-01 LAB — HGB A1C W/O EAG: Hgb A1c MFr Bld: 5.6 % (ref 4.8–5.6)

## 2019-04-01 LAB — MAGNESIUM: Magnesium: 1.9 mg/dL (ref 1.6–2.3)

## 2019-04-01 LAB — ANTI-DNA ANTIBODY, DOUBLE-STRANDED

## 2019-04-01 LAB — RHEUMATOID FACTOR

## 2019-04-01 LAB — ANTI-SMOOTH MUSCLE AB BY IFA

## 2019-04-01 LAB — TSH: TSH: 3.2 u[IU]/mL (ref 0.450–4.500)

## 2019-04-07 ENCOUNTER — Other Ambulatory Visit: Payer: Self-pay | Admitting: Internal Medicine

## 2019-04-12 ENCOUNTER — Telehealth: Payer: Self-pay | Admitting: Physician Assistant

## 2019-04-12 NOTE — Telephone Encounter (Signed)
Patient with 2 accounts, will try to merge.  MRN: [id number]  YBN:[id number]   Your trigs are not in range, 03/31/2019: Triglycerides 583. Triglycerides are simple sugars in blood that are converted into a storage form. I recommend you avoid fried/greasy foods, sweets/candy, white rice , white potatoes, anything made from white flour, sweet tea, soda, fruit juices and avoid alcohol in excess. Sweet potatoes, brown/wild rice/Quinoa, Vegetarian, spinach, or wheat pasta, Multi-grain bread - like multi-grain flat bread or sandwich thins are okay.  This is elevated enough to cause acute pancreatitis which can put you in the hospital and kill you. VERY VERY important to be strict with diet.   Your Vitamin D is not in range, goal is between 70-100. Please make sure that you are taking your Vitamin D as directed. It is very important as a natural antiinflammatory helping hair, skin, and nails, as well as reducing stroke and heart attack risk. It helps your bones and helps with mood. It also decreases numerous cancer risks so please take it as directed. Get on 5000 IU daily.

## 2019-04-12 NOTE — Telephone Encounter (Signed)
Patient with 2 accounts, will try to merge.  MRN: [id number]  YIA:[id number]   Your trigs are not in range, 03/31/2019: Triglycerides 583. Triglycerides are simple sugars in blood that are converted into a storage form. I recommend you avoid fried/greasy foods, sweets/candy, white rice , white potatoes, anything made from white flour, sweet tea, soda, fruit juices and avoid alcohol in excess. Sweet potatoes, brown/wild rice/Quinoa, Vegetarian, spinach, or wheat pasta, Multi-grain bread - like multi-grain flat bread or sandwich thins are okay.  This is elevated enough to cause acute pancreatitis which can put you in the hospital and kill you. VERY VERY important to be strict with diet.   Your Vitamin D is not in range, goal is between 70-100. Please make sure that you are taking your Vitamin D as directed. It is very important as a natural antiinflammatory helping hair, skin, and nails, as well as reducing stroke and heart attack risk. It helps your bones and helps with mood. It also decreases numerous cancer risks so please take it as directed. Get on 5000 IU daily.

## 2019-04-13 NOTE — Telephone Encounter (Signed)
Sent ticket to [employer] to merge MRNs.

## 2019-04-18 LAB — COMPREHENSIVE METABOLIC PANEL
ALT: 22 IU/L (ref 0–32)
AST: 18 IU/L (ref 0–40)
Albumin/Globulin Ratio: 1.8 (ref 1.2–2.2)
Albumin: 4.2 g/dL (ref 3.8–4.8)
Alkaline Phosphatase: 107 IU/L (ref 39–117)
BUN/Creatinine Ratio: 42 — ABNORMAL HIGH (ref 12–28)
BUN: 20 mg/dL (ref 8–27)
Bilirubin Total: 0.2 mg/dL (ref 0.0–1.2)
CO2: 23 mmol/L (ref 20–29)
Calcium: 10 mg/dL (ref 8.7–10.3)
Chloride: 99 mmol/L (ref 96–106)
Creatinine, Ser: 0.48 mg/dL — ABNORMAL LOW (ref 0.57–1.00)
GFR calc Af Amer: 120 mL/min/{1.73_m2} (ref >59)
GFR calc non Af Amer: 104 mL/min/{1.73_m2} (ref >59)
Globulin, Total: 2.4 g/dL (ref 1.5–4.5)
Glucose: 118 mg/dL — ABNORMAL HIGH (ref 65–99)
Potassium: 4.3 mmol/L (ref 3.5–5.2)
Sodium: 140 mmol/L (ref 134–144)
Total Protein: 6.6 g/dL (ref 6.0–8.5)

## 2019-04-18 LAB — CBC WITH DIFFERENTIAL/PLATELET
Basophils Absolute: 0 10*3/uL (ref 0.0–0.2)
Basos: 1 %
EOS (ABSOLUTE): 0.1 10*3/uL (ref 0.0–0.4)
Eos: 1 %
Hematocrit: 40.1 % (ref 34.0–46.6)
Hemoglobin: 13.8 g/dL (ref 11.1–15.9)
Immature Grans (Abs): 0 10*3/uL (ref 0.0–0.1)
Immature Granulocytes: 0 %
Lymphocytes Absolute: 1.8 10*3/uL (ref 0.7–3.1)
Lymphs: 23 %
MCH: 33 pg (ref 26.6–33.0)
MCHC: 34.4 g/dL (ref 31.5–35.7)
MCV: 96 fL (ref 79–97)
Monocytes Absolute: 0.4 10*3/uL (ref 0.1–0.9)
Monocytes: 5 %
Neutrophils Absolute: 5.4 10*3/uL (ref 1.4–7.0)
Neutrophils: 70 %
Platelets: 253 10*3/uL (ref 150–450)
RBC: 4.18 x10E6/uL (ref 3.77–5.28)
RDW: 12.6 % (ref 11.7–15.4)
WBC: 7.8 10*3/uL (ref 3.4–10.8)

## 2019-04-18 LAB — CYCLIC CITRUL PEPTIDE ANTIBODY, IGG/IGA: Cyclic Citrullin Peptide Ab: 6 units (ref 0–19)

## 2019-04-18 LAB — LIPID PANEL W/O CHOL/HDL RATIO
Cholesterol, Total: 202 mg/dL — ABNORMAL HIGH (ref 100–199)
HDL: 47 mg/dL (ref >39)
Triglycerides: 583 mg/dL (ref 0–149)

## 2019-04-18 LAB — ANTI-DNA ANTIBODY, DOUBLE-STRANDED: dsDNA Ab: <1 IU/mL (ref 0–9)

## 2019-04-18 LAB — SPECIMEN STATUS REPORT

## 2019-04-18 LAB — ANTIEXTRACTABLE NUCLEAR AG
ENA RNP Ab: <0.2 AI (ref 0.0–0.9)
ENA SM Ab Ser-aCnc: <0.2 AI (ref 0.0–0.9)

## 2019-04-18 LAB — ANTI-SMOOTH MUSCLE AB BY IFA: Anti-Smooth Muscle Ab by IFA: 1:20 {titer} — ABNORMAL HIGH

## 2019-04-18 LAB — ANA: Anti Nuclear Antibody (ANA): NEGATIVE

## 2019-04-18 LAB — MAGNESIUM: Magnesium: 1.9 mg/dL (ref 1.6–2.3)

## 2019-04-18 LAB — T4, FREE: Free T4: 0.96 ng/dL (ref 0.82–1.77)

## 2019-04-18 LAB — RHEUMATOID FACTOR: Rhuematoid fact SerPl-aCnc: <10 IU/mL (ref 0.0–13.9)

## 2019-04-18 LAB — SEDIMENTATION RATE: Sed Rate: 7 mm/hr (ref 0–40)

## 2019-04-18 LAB — HGB A1C W/O EAG: Hgb A1c MFr Bld: 5.6 % (ref 4.8–5.6)

## 2019-04-18 LAB — VITAMIN D 25 HYDROXY (VIT D DEFICIENCY, FRACTURES): Vit D, 25-Hydroxy: 31.7 ng/mL (ref 30.0–100.0)

## 2019-04-18 LAB — TSH: TSH: 3.2 u[IU]/mL (ref 0.450–4.500)

## 2019-04-18 LAB — HLA-B27 ANTIGEN: HLA B27: NEGATIVE

## 2019-04-28 ENCOUNTER — Other Ambulatory Visit: Payer: Self-pay | Admitting: Physician Assistant

## 2019-05-08 IMAGING — DX DG KNEE 1-2V*L*
2 series · 2 of 2 positions shown · non-contrast
Comparison: None.

CLINICAL DATA: 61-year-old female status post total knee
replacement.

EXAM:
LEFT KNEE - 1-2 VIEW

[knee ap]
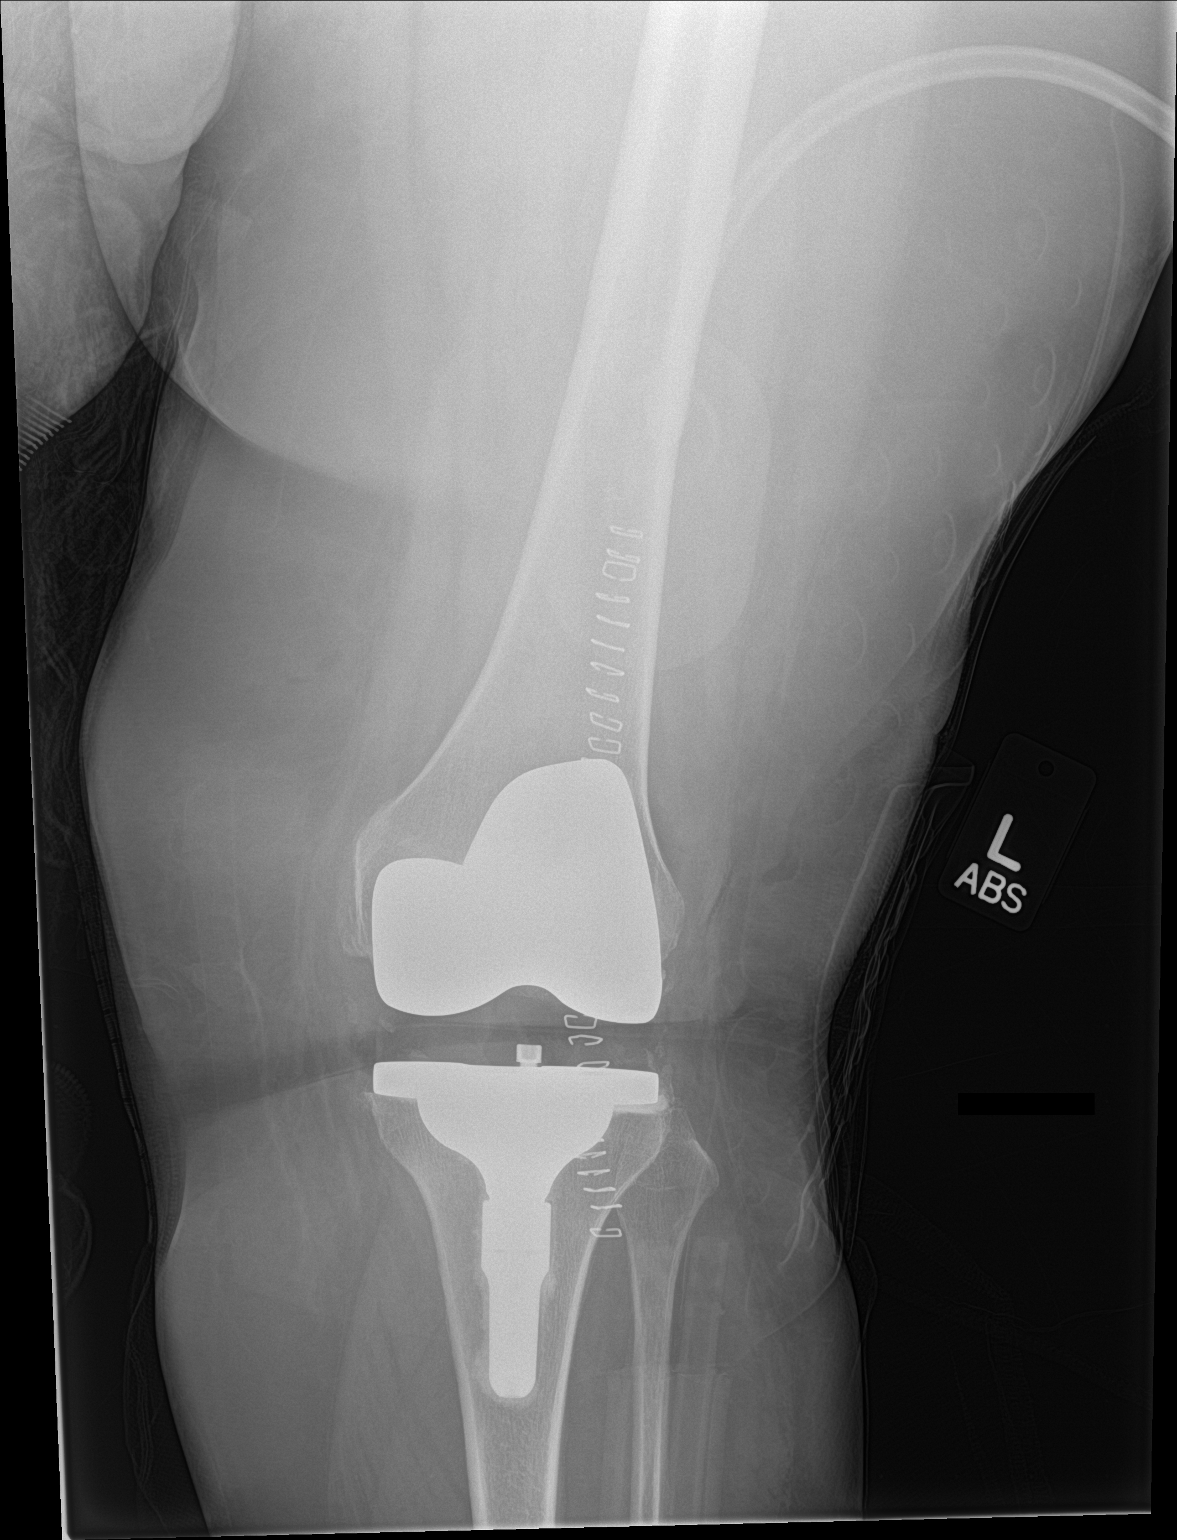

[knee lat]
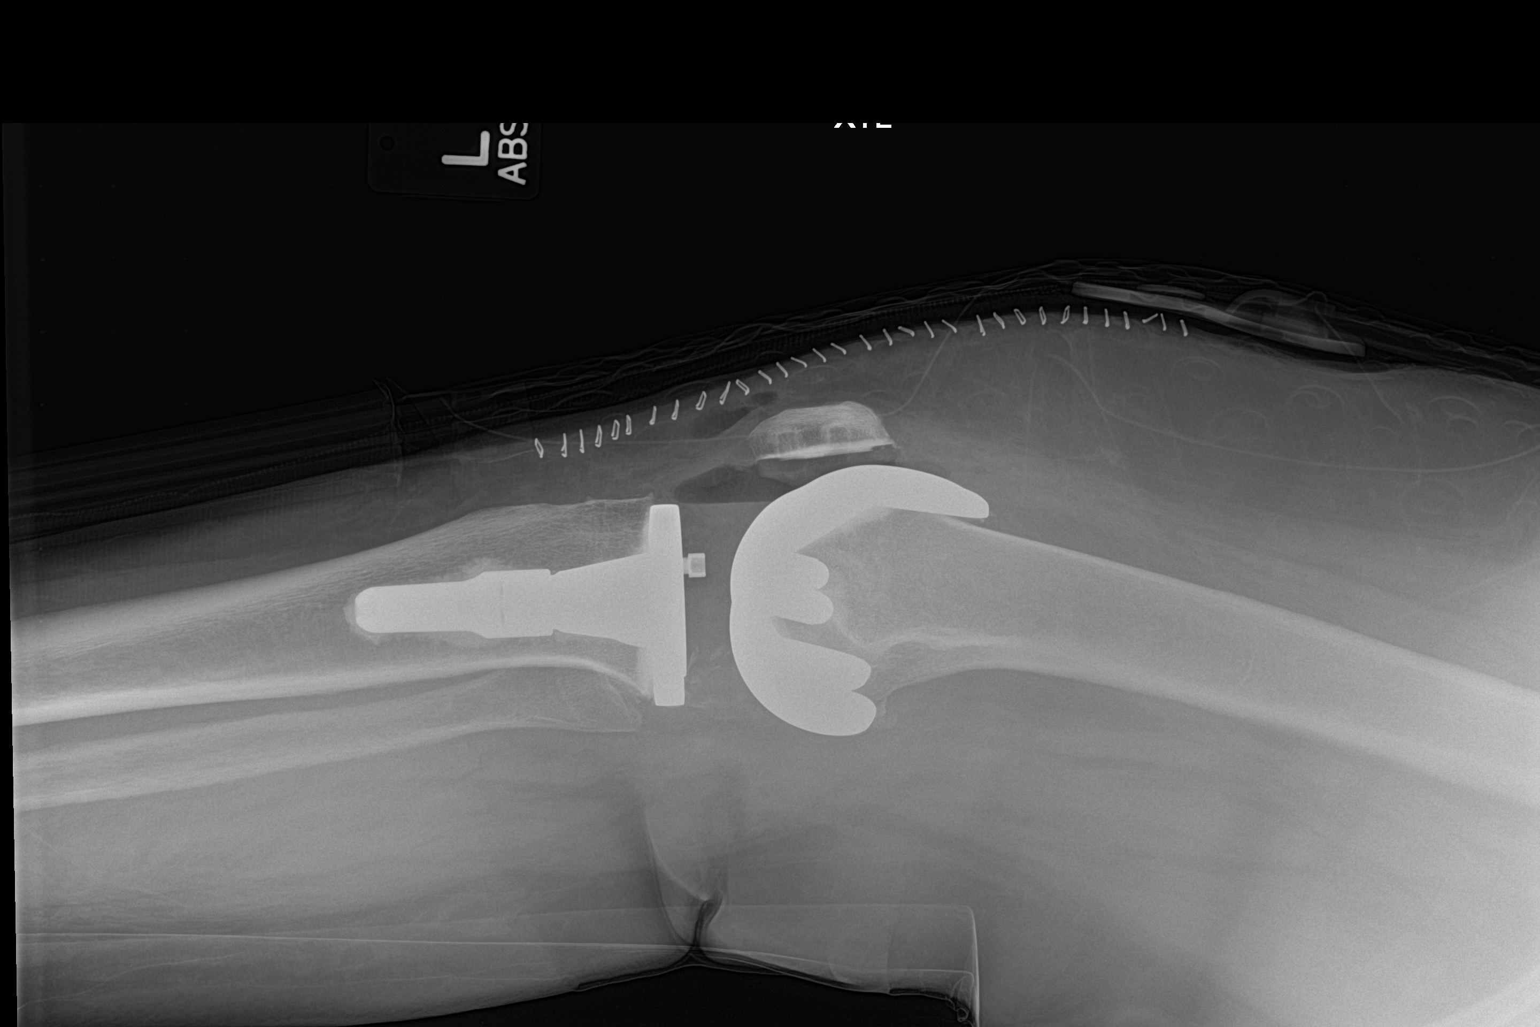

[2 of 2 positions shown; findings below may reference images not displayed]

FINDINGS: Portable AP and cross-table lateral views of the left knee. Anterior
skin staples in place. Left total knee hardware appears intact and
normally aligned. Postoperative air in fluid level in the joint
space. Mild postoperative subcutaneous gas elsewhere. No unexpected
osseous changes.
IMPRESSION: Left total knee arthroplasty with no adverse features.

## 2019-05-12 ENCOUNTER — Other Ambulatory Visit: Payer: Self-pay | Admitting: Internal Medicine

## 2019-05-16 ENCOUNTER — Other Ambulatory Visit: Payer: Self-pay | Admitting: Internal Medicine

## 2019-07-06 ENCOUNTER — Encounter: Payer: Self-pay | Admitting: Physician Assistant

## 2019-08-07 NOTE — Progress Notes (Signed)
Complete Physical  LABS DONE AT [facility], PATIENT WORKS THERE AND THEY ARE FREE- GIVEN LAB RX  Assessment and Plan: Essential hypertension - continue medications, DASH diet, exercise and monitor at home. Call if greater than 130/80.  WITH WEIGHT LOSS MAY HAVE TO CUT BACK ON MEDICATIONS, MONITOR BP  Abnormal glucose Discussed general issues about diabetes pathophysiology and management., Educational material distributed., Suggested low cholesterol diet., Encouraged aerobic exercise., Discussed foot care., Reminded to get yearly retinal exam.   Hyperlipidemia -continue medications, check lipids, decrease fatty foods, increase activity.   Morbid obesity - follow up 3 months for progress monitoring - increase veggies, decrease carbs - long discussion about weight loss, diet, and exercise - CONTINUE WEIGHT LOSS, WEIGHT LOSS GOAL IS 180   LAP-BAND surgery status Follow up Dr. Hassell Done   Asthma, unspecified asthma severity, uncomplicated Asthma controlled   IBS (irritable bowel syndrome) Diet controlled   Gastroesophageal reflux disease with esophagitis Continue PPI/H2 blocker, diet discussed   Arthritis RICE, NSAIDS, exercises given, follow up ortho  Allergy, subsequent encounter Continue OTC allergy pills   Vitamin D deficiency Continue supplement   Medication management  Dysuria Will check urine.   Screening for diabetes mellitus   Discussed med's effects and SE's. Screening labs and tests as requested with regular follow-up as recommended.Gets labs done at [facility]  HPI 63 y.o. female  presents for a complete physical.  Still working at [employer] [facility].  BMI is Body mass index is 41.76 kg/m., she is working on diet and exercise. She is on optivia x August and has lost 30 lbs since that time. She states she is walking better and doing better.  Wt Readings from Last 3 Encounters:  08/08/19 221 lb (100.2 kg)  03/30/19 248 lb 9.6 oz (112.8 kg)  11/29/18 241 lb (109.3 kg)    Her blood pressure has been controlled at home, runs 120's at home, on lisinopril 40 and HCTZ 12.5mg  today their BP is BP: 130/72. She had her She denies chest pain, shortness of breath, dizziness.   Her cholesterol is diet controlled. Her cholesterol is controlled. She is NOT fasting for labs today. The cholesterol last visit was:  Lab Results  Component Value Date   CHOL 202 (H) 03/31/2019   CHOL 202 (H) 03/31/2019   HDL 47 03/31/2019   HDL 47 03/31/2019   [place] Comment 03/31/2019   [place] Comment 03/31/2019   TRIG 583 (HH) 03/31/2019   TRIG 583 ([place]) 03/31/2019   She has been working on diet and exercise for prediabetes, and denies blurry vision, polydipsia, polyphagia and polyuria. Last A1C in the office was: Lab Results  Component Value Date   HGBA1C 5.6 03/31/2019   HGBA1C 5.6 03/31/2019  Patient is on Vitamin D supplements.  Lab Results  Component Value Date   VD25OH 31.7 03/31/2019   VD25OH 31.7 03/31/2019   She is on prilosec for GERD. She is on effexor for IBS/diarrhea, she feels that it is not helping but would like to stay on it to prevent hot flashes.    She has lower back pain with sciatica left leg occ.  Current Medications:  Current Outpatient Medications on File Prior to Visit  Medication Sig Dispense Refill  . Cholecalciferol (VITAMIN D-3) 5000 units TABS Take 5,000 Units by mouth daily.    . Coenzyme Q10 (CO Q10 PO) Take 1 capsule by mouth daily.    . Cyanocobalamin (VITAMIN B-12 PO) Take 1 tablet by mouth daily.    Marland Kitchen  GLUCOSAMINE HCL PO Take 1 capsule by mouth daily.    . hydrochlorothiazide (HYDRODIURIL) 12.5 MG tablet Take 1 tablet Daily for BP & Fluid 90 tablet 3  . lisinopril (ZESTRIL) 40 MG tablet Take 1 tablet Daily for BP 90 tablet 1  . loratadine (CLARITIN) 10 MG tablet Take 10 mg by mouth every morning.     . Multiple Vitamin (MULTIVITAMIN) capsule Take 1 capsule by mouth daily.     Marland Kitchen omeprazole (PRILOSEC) 20 MG capsule Take 1 capsule Daily  for Acid Indigestion & Reflux 90 capsule 3  . venlafaxine XR (EFFEXOR-XR) 75 MG 24 hr capsule Take 1 capsule Daily for Mood. 90 capsule 3   No current facility-administered medications on file prior to visit.    Health Maintenance:  Immunization History  Administered Date(s) Administered  . Influenza Inj Mdck Quad With Preservative 06/27/2018  . Influenza,inj,Quad PF,6+ Mos 06/06/2017, 05/19/2019  . Influenza-Unspecified 07/11/2013, 06/23/2017  . Td 04/28/2006  . Tdap 12/04/2015   Tetanus: 2017 Pneumovax:N/A Prevnar 13: due at 65 Flu vaccine: 2020 Zostavax: discussed  Pap: Feb 2020, has OV next year MGM: 12/2017 3D neg CAT B at [facility]- (Dr. Dellis Filbert)  DEXA: 2010 normal- non smoker, no family history, BMI elevated, no broken bones- will schedule with Dr. Dellis Filbert Colonoscopy: November 04 2018 (Dr. Collene Mares) EGD: N/A Myoview stress test 10/2001 AB [place] 2003 DEE 01/2015 Dr. Gaylyn Cheers Dentist: Dr. Carman Ching in [place], q 6 months Derm Dr. Nicole Kindred, [place]  Medical History:  Past Medical History:  Diagnosis Date  . Arthritis    LEFT KNEE, LOWER BACK  . GERD (gastroesophageal reflux disease)   . History of hiatal hernia   . History of obstructive sleep apnea    per pt history osa w/ cpap until wt loss after gastric band, retested negative for sleep apnea  . Hypertension   . IBS (irritable bowel syndrome)   . Numbness    BOTTOM LEFT FOOT  . PONV (postoperative nausea and vomiting)    PONV with gallbladder   . Seasonal allergies   . Sinus drainage   . Snores    WEARS NIGHT GUARD  . SUI (stress urinary incontinence, female)    Allergies Allergies  Allergen Reactions  . Mucinex [Guaifenesin Er] Other (See Comments)    "feels weird and dizzy"  . 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 FAMILY HISTORY Her family history includes Cancer in her paternal grandfather; Heart disease in her father; Hyperlipidemia in her father. SOCIAL HISTORY She  reports that she quit smoking about 20 years ago. Her smoking use included cigarettes. She has a 7.00 pack-year smoking history. She has never used smokeless tobacco. She reports that she does not drink alcohol or use drugs.  Review of Systems  Constitutional: Negative.   HENT: Negative for congestion, ear discharge, ear pain, hearing loss, nosebleeds, sore throat and tinnitus.   Eyes: Negative.   Respiratory: Negative for cough, hemoptysis, sputum production, shortness of breath, wheezing and stridor.  Cardiovascular: Negative.   Gastrointestinal: Negative for abdominal pain, blood in stool, constipation, diarrhea, heartburn, melena, nausea and vomiting.  Genitourinary: Negative.   Musculoskeletal: Negative for back pain (lower back pain with pain better with weight loss and after knee revision), falls, joint pain, myalgias and neck pain.  Skin: Negative.   Neurological: Negative.  Negative for headaches.  Endo/Heme/Allergies: Negative.   Psychiatric/Behavioral: Negative.     Physical Exam: Blood pressure 130/72, pulse 80, temperature (!) 97.3 F (36.3 C), height 5\' 1"  (1.549 m), weight 221 lb (100.2 kg), SpO2 99 %. Body mass index is 41.76 kg/m. General Appearance: Well nourished, in no apparent distress. Eyes: PERRLA, EOMs, conjunctiva no swelling  or erythema, normal fundi and vessels. Sinuses: No Frontal/maxillary tenderness ENT/Mouth: Ext aud canals clear, normal light reflex with TMs without erythema, bulging.  Good dentition. No erythema, swelling, or exudate on post pharynx. Tonsils not swollen or erythematous. Hearing normal.  Neck: Supple, thyroid normal. No bruits Respiratory: Respiratory effort normal, BS equal bilaterally without rales, rhonchi, wheezing or stridor. Cardio: RRR without murmurs, rubs or gallops. Brisk peripheral pulses without edema.  Chest: symmetric, with normal excursions and percussion. Breasts: defer Abdomen: Soft, +BS, obese, notenderness, no guarding, rebound, hernias, masses, or organomegaly. .  Lymphatics: Non tender without lymphadenopathy.  Genitourinary: defer Musculoskeletal: Full ROM all peripheral extremities,5/5 strength, and antalgic gait with cane. Skin: left cheek near neck with flesh colored nodule 2x3, no telangiectasias, erythema, will monitor. Warm, dry without rashes, lesions, ecchymosis.  Neuro: Cranial nerves intact, reflexes equal bilaterally. Normal muscle tone, no cerebellar symptoms. Sensation intact.  Psych: Awake and oriented X 3, normal affect, Insight and Judgment appropriate.   EKG: EKG WNL    [name] 3:30 PM

## 2019-08-08 ENCOUNTER — Ambulatory Visit (INDEPENDENT_AMBULATORY_CARE_PROVIDER_SITE_OTHER): Payer: Managed Care, Other (non HMO) | Admitting: Physician Assistant

## 2019-08-08 ENCOUNTER — Other Ambulatory Visit: Payer: Self-pay

## 2019-08-08 ENCOUNTER — Encounter: Payer: Self-pay | Admitting: Physician Assistant

## 2019-08-08 VITALS — BP 130/72 | HR 80 | Temp 97.3°F | Ht 61.0 in | Wt 221.0 lb

## 2019-08-08 DIAGNOSIS — E785 Hyperlipidemia, unspecified: Secondary | ICD-10-CM

## 2019-08-08 DIAGNOSIS — I10 Essential (primary) hypertension: Secondary | ICD-10-CM | POA: Diagnosis not present

## 2019-08-08 DIAGNOSIS — Z9884 Bariatric surgery status: Secondary | ICD-10-CM

## 2019-08-08 DIAGNOSIS — E559 Vitamin D deficiency, unspecified: Secondary | ICD-10-CM

## 2019-08-08 DIAGNOSIS — Z0001 Encounter for general adult medical examination with abnormal findings: Secondary | ICD-10-CM

## 2019-08-08 DIAGNOSIS — Z0000 Encounter for general adult medical examination without abnormal findings: Secondary | ICD-10-CM

## 2019-08-08 DIAGNOSIS — D649 Anemia, unspecified: Secondary | ICD-10-CM

## 2019-08-08 DIAGNOSIS — J3089 Other allergic rhinitis: Secondary | ICD-10-CM

## 2019-08-08 DIAGNOSIS — K2100 Gastro-esophageal reflux disease with esophagitis, without bleeding: Secondary | ICD-10-CM

## 2019-08-08 DIAGNOSIS — Z136 Encounter for screening for cardiovascular disorders: Secondary | ICD-10-CM | POA: Diagnosis not present

## 2019-08-08 DIAGNOSIS — Z79899 Other long term (current) drug therapy: Secondary | ICD-10-CM

## 2019-08-08 DIAGNOSIS — Z131 Encounter for screening for diabetes mellitus: Secondary | ICD-10-CM

## 2019-08-08 DIAGNOSIS — M1990 Unspecified osteoarthritis, unspecified site: Secondary | ICD-10-CM

## 2019-08-08 DIAGNOSIS — R300 Dysuria: Secondary | ICD-10-CM

## 2019-08-08 NOTE — Patient Instructions (Addendum)
Please monitor your blood pressure. If it is getting below 130/80 AND you are having fatigue with exertion, dizziness we may need to cut your blood pressure medication in half. Please call the office if this is happening. Hypotension As your heart beats, it forces blood through your body. This force is called blood pressure. If you have hypotension, you have low blood pressure. When your blood pressure is too low, you may not get enough blood to your brain. You may feel weak, feel lightheaded, have a fast heartbeat, or even pass out (faint). HOME CARE  Drink enough fluids to keep your pee (urine) clear or pale yellow.  Take all medicines as told by your doctor.  Get up slowly after sitting or lying down.  Wear support stockings as told by your doctor.  Maintain a healthy diet by including foods such as fruits, vegetables, nuts, whole grains, and lean meats. GET HELP IF:  You are throwing up (vomiting) or have watery poop (diarrhea).  You have a fever for more than 2-3 days.  You feel more thirsty than usual.  You feel weak and tired. GET HELP RIGHT AWAY IF:   You pass out (faint).  You have chest pain or a fast or irregular heartbeat.  You lose feeling in part of your body.  You cannot move your arms or legs.  You have trouble speaking.  You get sweaty or feel lightheaded. MAKE SURE YOU:   Understand these instructions.  Will watch your condition.  Will get help right away if you are not doing well or get worse. Document Released: 11/11/2009 Document Revised: 04/19/2013 Document Reviewed: 02/17/2013 [facility] Patient Information 2015 [place], [place]. This information is not intended to replace advice given to you by your health care provider. Make sure you discuss any questions you have with your health care provider.   Ask insurance and pharmacy about shingrix - new vaccine   Can go to [email or web address] for more  information  Shingrix Vaccination  Two vaccines are licensed and recommended to prevent shingles in the [place] Zoster vaccine live (ZVL, Zostavax) has been in use since 2006. Recombinant zoster vaccine (RZV, Shingrix), has been in use since 2017 and is recommended by ACIP as the preferred shingles vaccine.  What Everyone Should Know about Shingles Vaccine (Shingrix) One of the Recommended Vaccines by Disease Shingles vaccination is the only way to protect against shingles and postherpetic neuralgia (PHN), the most common complication from shingles. CDC recommends that healthy adults 50 years and older get two doses of the shingles vaccine called Shingrix (recombinant zoster vaccine), separated by 2 to 6 months, to prevent shingles and the complications from the disease. Your doctor or pharmacist can give you Shingrix as a shot in your upper arm. Shingrix provides strong protection against shingles and PHN. Two doses of Shingrix is more than 90% effective at preventing shingles and PHN. Protection stays above 85% for at least the first four years after you get vaccinated. Shingrix is the preferred vaccine, over Zostavax (zoster vaccine live), a shingles vaccine in use since 2006. Zostavax may still be used to prevent shingles in healthy adults 60 years and older. For example, you could use Zostavax if a person is allergic to Shingrix, prefers Zostavax, or requests immediate vaccination and Shingrix is unavailable. Who Should Get Shingrix? Healthy adults 50 years and older should get two doses of Shingrix, separated by 2 to 6 months. You should get Shingrix even if in the past you . had shingles  .  received Zostavax  . are not sure if you had chickenpox There is no maximum age for getting Shingrix. If you had shingles in the past, you can get Shingrix to help prevent future occurrences of the disease. There is no specific length of time that you need to wait after having shingles before you can receive  Shingrix, but generally you should make sure the shingles rash has gone away before getting vaccinated. You can get Shingrix whether or not you remember having had chickenpox in the past. Studies show that more than 99% of Americans 40 years and older have had chickenpox, even if they don't remember having the disease. Chickenpox and shingles are related because they are caused by the same virus (varicella zoster virus). After a person recovers from chickenpox, the virus stays dormant (inactive) in the body. It can reactivate years later and cause shingles. If you had Zostavax in the recent past, you should wait at least eight weeks before getting Shingrix. Talk to your healthcare provider to determine the best time to get Shingrix. Shingrix is available in [employer] and pharmacies. To find doctor's offices or pharmacies near you that offer the vaccine, visit HealthMap Vaccine FinderExternal. If you have questions about Shingrix, talk with your healthcare provider. Vaccine for Those 3 Years and Older  Shingrix reduces the risk of shingles and PHN by more than 90% in people 41 and older. CDC recommends the vaccine for healthy adults 14 and older.  Who Should Not Get Shingrix? You should not get Shingrix if you: . have ever had a severe allergic reaction to any component of the vaccine or after a dose of Shingrix  . tested negative for immunity to varicella zoster virus. If you test negative, you should get chickenpox vaccine.  . currently have shingles  . currently are pregnant or breastfeeding. Women who are pregnant or breastfeeding should wait to get Shingrix.  Marland Kitchen receive specific antiviral drugs (acyclovir, famciclovir, or valacyclovir) 24 hours before vaccination (avoid use of these antiviral drugs for 14 days after vaccination)- zoster vaccine live only If you have a minor acute (starts suddenly) illness, such as a cold, you may get Shingrix. But if you have a moderate or severe acute  illness, you should usually wait until you recover before getting the vaccine. This includes anyone with a temperature of 101.57F or higher. The side effects of the Shingrix are temporary, and usually last 2 to 3 days. While you may experience pain for a few days after getting Shingrix, the pain will be less severe than having shingles and the complications from the disease. How Well Does Shingrix Work? Two doses of Shingrix provides strong protection against shingles and postherpetic neuralgia (PHN), the most common complication of shingles. . In adults 64 to 64 years old who got two doses, Shingrix was 97% effective in preventing shingles; among adults 70 years and older, Shingrix was 91% effective.  . In adults 73 to 64 years old who got two doses, Shingrix was 91% effective in preventing PHN; among adults 70 years and older, Shingrix was 89% effective. Shingrix protection remained high (more than 85%) in people 70 years and older throughout the four years following vaccination. Since your risk of shingles and PHN increases as you get older, it is important to have strong protection against shingles in your older years. Top of Page  What Are the Possible Side Effects of Shingrix? Studies show that Shingrix is safe. The vaccine helps your body create a strong defense  against shingles. As a result, you are likely to have temporary side effects from getting the shots. The side effects may affect your ability to do normal daily activities for 2 to 3 days. Most people got a sore arm with mild or moderate pain after getting Shingrix, and some also had redness and swelling where they got the shot. Some people felt tired, had muscle pain, a headache, shivering, fever, stomach pain, or nausea. About 1 out of 6 people who got Shingrix experienced side effects that prevented them from doing regular activities. Symptoms went away on their own in about 2 to 3 days. Side effects were more common in younger people.  You might have a reaction to the first or second dose of Shingrix, or both doses. If you experience side effects, you may choose to take over-the-counter pain medicine such as ibuprofen or acetaminophen. If you experience side effects from Shingrix, you should report them to the Vaccine Adverse Event Reporting System (VAERS). Your doctor might file this report, or you can do it yourself through the VAERS websiteExternal, or by calling [phone]. If you have any questions about side effects from Shingrix, talk with your doctor. The shingles vaccine does not contain thimerosal (a preservative containing mercury). Top of Page  When Should I See a Doctor Because of the Side Effects I Experience From Shingrix? In clinical trials, Shingrix was not associated with serious adverse events. In fact, serious side effects from vaccines are extremely rare. For example, for every 1 million doses of a vaccine given, only one or two people may have a severe allergic reaction. Signs of an allergic reaction happen within minutes or hours after vaccination and include hives, swelling of the face and throat, difficulty breathing, a fast heartbeat, dizziness, or weakness. If you experience these or any other life-threatening symptoms, see a doctor right away. Shingrix causes a strong response in your immune system, so it may produce short-term side effects more intense than you are used to from other vaccines. These side effects can be uncomfortable, but they are expected and usually go away on their own in 2 or 3 days. Top of Page  How Can I Pay For Shingrix? There are several ways shingles vaccine may be paid for: Medicare . Medicare Part D plans cover the shingles vaccine, but there may be a cost to you depending on your plan. There may be a copay for the vaccine, or you may need to pay in full then get reimbursed for a certain amount.  . Medicare Part B does not cover the shingles vaccine. Medicaid . Medicaid  may or may not cover the vaccine. Contact your insurer to find out. Private health insurance . Many private health insurance plans will cover the vaccine, but there may be a cost to you depending on your plan. Contact your insurer to find out. Vaccine assistance programs . Some pharmaceutical companies provide vaccines to eligible adults who cannot afford them. You may want to check with the vaccine manufacturer, [employer], about Shingrix. If you do not currently have health insurance, learn more about affordable health coverage optionsExternal. To find doctor's offices or pharmacies near you that offer the vaccine, visit HealthMap Vaccine FinderExternal.

## 2019-08-11 ENCOUNTER — Other Ambulatory Visit: Payer: Self-pay | Admitting: Physician Assistant

## 2019-08-16 LAB — COMPREHENSIVE METABOLIC PANEL
ALT: 19 IU/L (ref 0–32)
AST: 16 IU/L (ref 0–40)
Albumin/Globulin Ratio: 1.8 (ref 1.2–2.2)
Albumin: 4.4 g/dL (ref 3.8–4.8)
Alkaline Phosphatase: 112 IU/L (ref 39–117)
BUN/Creatinine Ratio: 22 (ref 12–28)
BUN: 17 mg/dL (ref 8–27)
Bilirubin Total: 0.5 mg/dL (ref 0.0–1.2)
CO2: 26 mmol/L (ref 20–29)
Calcium: 10.2 mg/dL (ref 8.7–10.3)
Chloride: 100 mmol/L (ref 96–106)
Creatinine, Ser: 0.79 mg/dL (ref 0.57–1.00)
GFR calc Af Amer: 91 mL/min/{1.73_m2} (ref >59)
GFR calc non Af Amer: 79 mL/min/{1.73_m2} (ref >59)
Globulin, Total: 2.4 g/dL (ref 1.5–4.5)
Glucose: 107 mg/dL — ABNORMAL HIGH (ref 65–99)
Potassium: 4.6 mmol/L (ref 3.5–5.2)
Sodium: 142 mmol/L (ref 134–144)
Total Protein: 6.8 g/dL (ref 6.0–8.5)

## 2019-08-16 LAB — URINE CULTURE

## 2019-08-16 LAB — CBC WITH DIFFERENTIAL/PLATELET
Basophils Absolute: 0 10*3/uL (ref 0.0–0.2)
Basos: 1 %
EOS (ABSOLUTE): 0.1 10*3/uL (ref 0.0–0.4)
Eos: 2 %
Hematocrit: 40.6 % (ref 34.0–46.6)
Hemoglobin: 14.1 g/dL (ref 11.1–15.9)
Immature Grans (Abs): 0 10*3/uL (ref 0.0–0.1)
Immature Granulocytes: 0 %
Lymphocytes Absolute: 1.7 10*3/uL (ref 0.7–3.1)
Lymphs: 29 %
MCH: 33 pg (ref 26.6–33.0)
MCHC: 34.7 g/dL (ref 31.5–35.7)
MCV: 95 fL (ref 79–97)
Monocytes Absolute: 0.4 10*3/uL (ref 0.1–0.9)
Monocytes: 6 %
Neutrophils Absolute: 3.7 10*3/uL (ref 1.4–7.0)
Neutrophils: 62 %
Platelets: 263 10*3/uL (ref 150–450)
RBC: 4.27 x10E6/uL (ref 3.77–5.28)
RDW: 12.2 % (ref 11.7–15.4)
WBC: 5.9 10*3/uL (ref 3.4–10.8)

## 2019-08-16 LAB — URINALYSIS, COMPLETE
Bilirubin, UA: NEGATIVE
Glucose, UA: NEGATIVE
Ketones, UA: NEGATIVE
Nitrite, UA: NEGATIVE
Protein,UA: NEGATIVE
RBC, UA: NEGATIVE
Specific Gravity, UA: 1.019 (ref 1.005–1.030)
Urobilinogen, Ur: 0.2 mg/dL (ref 0.2–1.0)
pH, UA: 6 (ref 5.0–7.5)

## 2019-08-16 LAB — LIPID PANEL W/O CHOL/HDL RATIO
Cholesterol, Total: 175 mg/dL (ref 100–199)
HDL: 68 mg/dL (ref >39)
LDL Chol Calc (NIH): 85 mg/dL (ref 0–99)
Triglycerides: 128 mg/dL (ref 0–149)
VLDL Cholesterol Cal: 22 mg/dL (ref 5–40)

## 2019-08-16 LAB — FERRITIN: Ferritin: 71 ng/mL (ref 15–150)

## 2019-08-16 LAB — MICROSCOPIC EXAMINATION: Casts: NONE SEEN /lpf

## 2019-08-16 LAB — HGB A1C W/O EAG: Hgb A1c MFr Bld: 5.4 % (ref 4.8–5.6)

## 2019-08-16 LAB — VITAMIN B12: Vitamin B-12: 716 pg/mL (ref 232–1245)

## 2019-08-16 LAB — VITAMIN D 25 HYDROXY (VIT D DEFICIENCY, FRACTURES): Vit D, 25-Hydroxy: 80.7 ng/mL (ref 30.0–100.0)

## 2019-08-16 LAB — SPECIMEN STATUS REPORT

## 2019-08-16 LAB — IRON: Iron: 88 ug/dL (ref 27–139)

## 2019-08-16 LAB — MAGNESIUM: Magnesium: 2 mg/dL (ref 1.6–2.3)

## 2019-08-16 LAB — MICROALBUMIN / CREATININE URINE RATIO
Creatinine, Urine: 97.7 mg/dL
Microalb/Creat Ratio: 6 mg/g creat (ref 0–29)
Microalbumin, Urine: 6 ug/mL

## 2019-08-16 LAB — TSH: TSH: 2.49 u[IU]/mL (ref 0.450–4.500)

## 2019-08-20 ENCOUNTER — Other Ambulatory Visit: Payer: Self-pay | Admitting: Physician Assistant

## 2019-08-20 MED ORDER — CEFUROXIME AXETIL 250 MG PO TABS: 250.0000 mg | ORAL_TABLET | Freq: Two times a day (BID) | ORAL | 0 refills | Status: DC

## 2019-08-20 MED ORDER — FLUCONAZOLE 150 MG PO TABS: 150.0000 mg | ORAL_TABLET | Freq: Every day | ORAL | 3 refills | Status: DC

## 2019-09-04 ENCOUNTER — Telehealth: Payer: Self-pay | Admitting: Physician Assistant

## 2019-09-04 NOTE — Telephone Encounter (Signed)
Left voice message to inform the patient of MyChart that was sent.

## 2019-09-04 NOTE — Telephone Encounter (Signed)
Patient with recent intentional weight loss calling with hypotension and SOB.  Suggest stopping lisinopril and HCTZ, push fluids Monitor BP but if BP low enough or very SOB suggest going to ER to rule out COVID/Pulmonary embolism/heart

## 2019-09-04 NOTE — Telephone Encounter (Signed)
-----   Message from Elenor Quinones, [place] sent at 09/04/2019 10:06 AM EST ----- Regarding: office note Contact: [phone] PATIENT reports LOW BLOOD PRESSURE & SOB  Please advise

## 2019-10-09 ENCOUNTER — Encounter: Payer: Managed Care, Other (non HMO) | Admitting: Obstetrics & Gynecology

## 2019-10-10 ENCOUNTER — Other Ambulatory Visit: Payer: Self-pay | Admitting: Internal Medicine

## 2019-11-02 ENCOUNTER — Other Ambulatory Visit: Payer: Self-pay | Admitting: Internal Medicine

## 2019-11-13 ENCOUNTER — Other Ambulatory Visit: Payer: Self-pay

## 2019-11-14 ENCOUNTER — Encounter: Payer: Self-pay | Admitting: Obstetrics & Gynecology

## 2019-11-14 ENCOUNTER — Ambulatory Visit (INDEPENDENT_AMBULATORY_CARE_PROVIDER_SITE_OTHER): Payer: Managed Care, Other (non HMO) | Admitting: Obstetrics & Gynecology

## 2019-11-14 VITALS — BP 130/84 | Ht 61.0 in | Wt 227.6 lb

## 2019-11-14 DIAGNOSIS — Z780 Asymptomatic menopausal state: Secondary | ICD-10-CM | POA: Diagnosis not present

## 2019-11-14 DIAGNOSIS — Z90710 Acquired absence of both cervix and uterus: Secondary | ICD-10-CM | POA: Diagnosis not present

## 2019-11-14 DIAGNOSIS — Z01419 Encounter for gynecological examination (general) (routine) without abnormal findings: Secondary | ICD-10-CM

## 2019-11-14 DIAGNOSIS — Z13820 Encounter for screening for osteoporosis: Secondary | ICD-10-CM

## 2019-11-14 DIAGNOSIS — Z6841 Body Mass Index (BMI) 40.0 and over, adult: Secondary | ICD-10-CM

## 2019-11-14 NOTE — Progress Notes (Signed)
Maureen [name] 07-04-1956 [id number]   History:    64 y.o. G3P1A2L1 Married  RP: Established patient presenting for annual gyn exam   HPI: S/P Total Hysterectomy.  Menopause, well on no hormone replacement therapy. No pelvic pain.  Abstinent.  Breast normal.  Body mass index improved to 43. Gardening.  Health labs with family physician. Colonoscopy March 2020.  Past medical history,surgical history, family history and social history were all reviewed and documented in the EPIC chart.  Gynecologic History No LMP recorded. Patient is postmenopausal.  Obstetric History OB History  Gravida Para Term Preterm AB Living  3       2 1   SAB TAB Ectopic Multiple Live Births  2       1    # Outcome Date GA Lbr Len/2nd Weight Sex Delivery Anes PTL Lv  3 Gravida           2 SAB           1 SAB              ROS: A ROS was performed and pertinent positives and negatives are included in the history.  GENERAL: No fevers or chills. HEENT: No change in vision, no earache, sore throat or sinus congestion. NECK: No pain or stiffness. CARDIOVASCULAR: No chest pain or pressure. No palpitations. PULMONARY: No shortness of breath, cough or wheeze. GASTROINTESTINAL: No abdominal pain, nausea, vomiting or diarrhea, melena or bright red blood per rectum. GENITOURINARY: No urinary frequency, urgency, hesitancy or dysuria. MUSCULOSKELETAL: No joint or muscle pain, no back pain, no recent trauma. DERMATOLOGIC: No rash, no itching, no lesions. ENDOCRINE: No polyuria, polydipsia, no heat or cold intolerance. No recent change in weight. HEMATOLOGICAL: No anemia or easy bruising or bleeding. NEUROLOGIC: No headache, seizures, numbness, tingling or weakness. PSYCHIATRIC: No depression, no loss of interest in normal activity or change in sleep pattern.     Exam:   BP 130/84   Ht 5\' 1"  (1.549 m)   Wt 227 lb 9.6 oz (103.2 kg)   BMI 43.00 kg/m   Body mass index is 43 kg/m.  General appearance : Well  developed well nourished female. No acute distress HEENT: Eyes: no retinal hemorrhage or exudates,  Neck supple, trachea midline, no carotid bruits, no thyroidmegaly Lungs: Clear to auscultation, no rhonchi or wheezes, or rib retractions  Heart: Regular rate and rhythm, no murmurs or gallops Breast:Examined in sitting and supine position were symmetrical in appearance, no palpable masses or tenderness,  no skin retraction, no nipple inversion, no nipple discharge, no skin discoloration, no axillary or supraclavicular lymphadenopathy Abdomen: no palpable masses or tenderness, no rebound or guarding Extremities: no edema or skin discoloration or tenderness  Pelvic: Vulva: Normal             Vagina: No gross lesions or discharge  Cervix/Uterus absent  Adnexa  Without masses or tenderness  Anus: Normal   Assessment/Plan:  64 y.o. female for annual exam   1. Well female exam with routine gynecological exam Gynecologic exam status post LAVH.  Pap test February 2020 was negative, no indication to repeat this year.  Breast exam normal.  Will schedule screening mammogram now.  Colonoscopy March 2020.  Health labs with family physician.  2. S/P laparoscopic assisted vaginal hysterectomy (LAVH)  3. Postmenopausal Well on no hormone replacement therapy.  4. Screening for osteoporosis Schedule bone density now.  Vitamin D supplements, calcium intake of 1200 mg daily and regular weightbearing physical  activities. - DG Bone Density; Future  5. Class 3 severe obesity due to excess calories without serious comorbidity with body mass index (BMI) of 40.0 to 44.9 in adult [facility]) Recommend a lower calorie/carb diet such as [employer].  Aerobic activities 5 times a week and light weightlifting every 2 days.  Other orders - Zinc 15 MG CAPS; Take by mouth. - ELDERBERRY PO; Take by mouth.  [name] MD, 4:30 PM 11/14/2019

## 2019-11-20 ENCOUNTER — Other Ambulatory Visit: Payer: Self-pay

## 2019-11-21 ENCOUNTER — Ambulatory Visit (INDEPENDENT_AMBULATORY_CARE_PROVIDER_SITE_OTHER): Payer: Managed Care, Other (non HMO)

## 2019-11-21 ENCOUNTER — Other Ambulatory Visit: Payer: Self-pay | Admitting: Obstetrics & Gynecology

## 2019-11-21 DIAGNOSIS — Z780 Asymptomatic menopausal state: Secondary | ICD-10-CM | POA: Diagnosis not present

## 2019-11-21 DIAGNOSIS — M8588 Other specified disorders of bone density and structure, other site: Secondary | ICD-10-CM | POA: Diagnosis not present

## 2019-11-21 DIAGNOSIS — Z13820 Encounter for screening for osteoporosis: Secondary | ICD-10-CM

## 2019-11-21 DIAGNOSIS — M85852 Other specified disorders of bone density and structure, left thigh: Secondary | ICD-10-CM

## 2019-11-23 ENCOUNTER — Encounter: Payer: Self-pay | Admitting: Obstetrics & Gynecology

## 2019-11-23 NOTE — Patient Instructions (Signed)
1. Well female exam with routine gynecological exam Gynecologic exam status post LAVH.  Pap test February 2020 was negative, no indication to repeat this year.  Breast exam normal.  Will schedule screening mammogram now.  Colonoscopy March 2020.  Health labs with family physician.  2. S/P laparoscopic assisted vaginal hysterectomy (LAVH)  3. Postmenopausal Well on no hormone replacement therapy.  4. Screening for osteoporosis Schedule bone density now.  Vitamin D supplements, calcium intake of 1200 mg daily and regular weightbearing physical activities. - DG Bone Density; Future  5. Class 3 severe obesity due to excess calories without serious comorbidity with body mass index (BMI) of 40.0 to 44.9 in adult [facility]) Recommend a lower calorie/carb diet such as [employer].  Aerobic activities 5 times a week and light weightlifting every 2 days.  Other orders - Zinc 15 MG CAPS; Take by mouth. - ELDERBERRY PO; Take by mouth.  Maureen [name], it was a pleasure seeing you today!

## 2020-01-11 ENCOUNTER — Encounter: Payer: Self-pay | Admitting: Obstetrics & Gynecology

## 2020-02-08 ENCOUNTER — Ambulatory Visit (INDEPENDENT_AMBULATORY_CARE_PROVIDER_SITE_OTHER): Payer: Managed Care, Other (non HMO) | Admitting: Physician Assistant

## 2020-02-08 ENCOUNTER — Encounter: Payer: Self-pay | Admitting: Physician Assistant

## 2020-02-08 ENCOUNTER — Other Ambulatory Visit: Payer: Self-pay

## 2020-02-08 DIAGNOSIS — Z79899 Other long term (current) drug therapy: Secondary | ICD-10-CM | POA: Diagnosis not present

## 2020-02-08 DIAGNOSIS — I10 Essential (primary) hypertension: Secondary | ICD-10-CM

## 2020-02-08 DIAGNOSIS — E785 Hyperlipidemia, unspecified: Secondary | ICD-10-CM | POA: Diagnosis not present

## 2020-02-08 NOTE — Progress Notes (Signed)
FOLLOW UP  LABS DONE AT [facility], PATIENT WORKS THERE AND THEY ARE FREE- will do at CPE  Assessment and Plan: Essential hypertension - continue medications, DASH diet, exercise and monitor at home. Call if greater than 130/80.   Abnormal glucose Discussed general issues about diabetes pathophysiology and management., Educational material distributed., Suggested low cholesterol diet., Encouraged aerobic exercise., Discussed foot care., Reminded to get yearly retinal exam.   Hyperlipidemia -continue medications, check lipids, decrease fatty foods, increase activity.   Morbid obesity - follow up 3 months for progress monitoring - increase veggies, decrease carbs - long discussion about weight loss, diet, and exercise - CONTINUE WEIGHT LOSS, WEIGHT LOSS GOAL IS 180   LAP-BAND surgery status Follow up Dr. Hassell Done  Discussed med's effects and SE's. Screening labs and tests as requested with regular follow-up as recommended.Gets labs done at [facility]  HPI 64 y.o. female  presents for FOLLOW UP for obesity, HTN, chol.   Still working at [employer], plans on retiring in July 2022. BMI is Body mass index is 43.65 kg/m., she is working on diet and exercise. She is on optivia started back on it.  She states she is walking better and doing better.  Wt Readings from Last 3 Encounters:  02/08/20 231 lb (104.8 kg)  11/14/19 227 lb 9.6 oz (103.2 kg)  08/08/19 221 lb (100.2 kg)   Her blood pressure has been controlled at home, runs 120's at home, on lisinopril 40 and HCTZ 12.5mg  today their BP is BP: 128/86. She had her She denies chest pain, shortness of breath, dizziness.   Her cholesterol is diet controlled. Her cholesterol is controlled. She is NOT fasting for labs today. The cholesterol last visit was:  Lab Results  Component Value Date   CHOL 175 08/11/2019   HDL 68 08/11/2019   LDLCALC 85 08/11/2019   TRIG 128 08/11/2019   She has been working on diet and exercise for prediabetes,  and denies blurry vision, polydipsia, polyphagia and polyuria. Last A1C in the office was: Lab Results  Component Value Date   HGBA1C 5.4 08/11/2019  Patient is on Vitamin D supplements.  Lab Results  Component Value Date   VD25OH 80.7 08/11/2019    Current Medications:    Current Outpatient Medications (Cardiovascular):  .  hydrochlorothiazide (HYDRODIURIL) 12.5 MG tablet, Take 1 tablet Daily for BP & Fluid .  lisinopril (ZESTRIL) 40 MG tablet, Take 1 tablet Daily for BP  Current Outpatient Medications (Respiratory):  .  loratadine (CLARITIN) 10 MG tablet, Take 10 mg by mouth every morning.     Current Outpatient Medications (Other):  Marland Kitchen  Cholecalciferol (VITAMIN D-3) 5000 units TABS, Take 5,000 Units by mouth daily. Marland Kitchen  ELDERBERRY PO, Take by mouth. .  Multiple Vitamin (MULTIVITAMIN) capsule, Take 1 capsule by mouth daily.  Marland Kitchen  omeprazole (PRILOSEC) 20 MG capsule, Take 1 capsule Daily for Indigestion & Heartburn .  venlafaxine XR (EFFEXOR-XR) 75 MG 24 hr capsule, Take 1 capsule Daily for Mood. .  Zinc 15 MG CAPS, Take by mouth.  Medical History:  Past Medical History:  Diagnosis Date  . Arthritis    LEFT KNEE, LOWER BACK  . GERD (gastroesophageal reflux disease)   . History of hiatal hernia   . History of obstructive sleep apnea    per pt history osa w/ cpap until wt loss after gastric band, retested negative for sleep apnea  . Hypertension   . IBS (irritable bowel syndrome)   . Numbness  BOTTOM LEFT FOOT  . PONV (postoperative nausea and vomiting)    PONV with gallbladder   . Seasonal allergies   . Sinus drainage   . Snores    WEARS NIGHT GUARD  . SUI (stress urinary incontinence, female)    Allergies Allergies  Allergen Reactions  . Mucinex [Guaifenesin Er] Other (See Comments)    "feels weird and dizzy"  . Tramadol Itching    Surgical History: reviewed and unchanged Family History: reviewed and unchanged Social History: reviewed and unchanged  Review  of Systems  Constitutional: Negative.   HENT: Negative for congestion, ear discharge, ear pain, hearing loss, nosebleeds, sore throat and tinnitus.   Eyes: Negative.   Respiratory: Negative for cough, hemoptysis, sputum production, shortness of breath, wheezing and stridor.   Cardiovascular: Negative.   Gastrointestinal: Negative for abdominal pain, blood in stool, constipation, diarrhea, heartburn, melena, nausea and vomiting.  Genitourinary: Negative.   Musculoskeletal: Negative for back pain (lower back pain with pain better with weight loss and after knee revision), falls, joint pain, myalgias and neck pain.  Skin: Negative.   Neurological: Negative.  Negative for headaches.  Endo/Heme/Allergies: Negative.   Psychiatric/Behavioral: Negative.     Physical Exam: Blood pressure 128/86, pulse 82, temperature (!) 97.5 F (36.4 C), weight 231 lb (104.8 kg), SpO2 96 %. Body mass index is 43.65 kg/m. General Appearance: Well nourished, in no apparent distress. Eyes: PERRLA, EOMs, conjunctiva no swelling or erythema, normal fundi and vessels. Sinuses: No Frontal/maxillary tenderness ENT/Mouth: Ext aud canals clear, normal light reflex with TMs without erythema, bulging.  Good dentition. No erythema, swelling, or exudate on post pharynx. Tonsils not swollen or erythematous. Hearing normal.  Neck: Supple, thyroid normal. No bruits Respiratory: Respiratory effort normal, BS equal bilaterally without rales, rhonchi, wheezing or stridor. Cardio: RRR without murmurs, rubs or gallops. Brisk peripheral pulses without edema.  Chest: symmetric, with normal excursions and percussion. Abdomen: Soft, +BS, obese, notenderness, no guarding, rebound, hernias, masses, or organomegaly. .  Lymphatics: Non tender without lymphadenopathy.  Musculoskeletal: Full ROM all peripheral extremities,5/5 strength, and antalgic gait with cane. Skin: left cheek near neck with flesh colored nodule 2x3, no telangiectasias,  erythema, will monitor. Warm, dry without rashes, lesions, ecchymosis.  Neuro: Cranial nerves intact, reflexes equal bilaterally. Normal muscle tone, no cerebellar symptoms. Sensation intact.  Psych: Awake and oriented X 3, normal affect, Insight and Judgment appropriate.    [name] 3:29 PM

## 2020-02-08 NOTE — Patient Instructions (Signed)
Built bar mint brownie [employer] protein chips  Rebuilt meals   Check out  Mini habits for weight loss book  2 free apps for tracking food is myfitness pal  loseit  If you want more structured weight loss that you have to pay for, you can look into  Noom  weight watchers  General eating tips  What to Avoid . Avoid added sugars o Often added sugar can be found in processed foods such as many condiments, dry cereals, cakes, cookies, chips, crisps, crackers, candies, sweetened drinks, etc.  o Read labels and AVOID/DECREASE use of foods with the following in their ingredient list: Sugar, fructose, high fructose corn syrup, sucrose, glucose, maltose, dextrose, molasses, cane sugar, brown sugar, any type of syrup, agave nectar, etc.   . Avoid snacking in between meals- drink water or if you feel you need a snack, pick a high water content snack such as cucumbers, watermelon, or any veggie.  Marland Kitchen Avoid foods made with flour o If you are going to eat food made with flour, choose those made with whole-grains; and, minimize your consumption as much as is tolerable . Avoid processed foods o These foods are generally stocked in the middle of the grocery store.  o Focus on shopping on the perimeter of the grocery.  What to Include . Vegetables o GREEN LEAFY VEGETABLES: Kale, spinach, mustard greens, collard greens, cabbage, broccoli, etc. o OTHER: Asparagus, cauliflower, eggplant, carrots, peas, Brussel sprouts, tomatoes, bell peppers, zucchini, beets, cucumbers, etc. . Grains, seeds, and legumes o Beans: kidney beans, black eyed peas, garbanzo beans, black beans, pinto beans, etc. o Whole, unrefined grains: brown rice, barley, bulgur, oatmeal, etc. . Healthy fats  o Avoid highly processed fats such as vegetable oil o Examples of healthy fats: avocado, olives, virgin olive oil, dark chocolate (?72% Cocoa), nuts (peanuts, almonds, walnuts, cashews, pecans, etc.) o Please still do small amount of  these healthy fats, they are dense in calories.  . Low - Moderate Intake of Animal Sources of Protein o Meat sources: chicken, [place], salmon, tuna. Limit to 4 ounces of meat at one time or the size of your palm. o Consider limiting dairy sources, but when choosing dairy focus on: PLAIN [place] yogurt, cottage cheese, high-protein milk . Fruit o Choose berries

## 2020-04-04 ENCOUNTER — Other Ambulatory Visit: Payer: Self-pay | Admitting: Internal Medicine

## 2020-05-14 ENCOUNTER — Ambulatory Visit
Admission: RE | Admit: 2020-05-14 | Discharge: 2020-05-14 | Disposition: A | Discharge status: Home / Self Care | Payer: Managed Care, Other (non HMO) | Source: Ambulatory Visit | Attending: Adult Health | Admitting: Adult Health

## 2020-05-14 ENCOUNTER — Other Ambulatory Visit: Payer: Self-pay

## 2020-05-14 ENCOUNTER — Encounter: Payer: Self-pay | Admitting: Adult Health

## 2020-05-14 ENCOUNTER — Ambulatory Visit (INDEPENDENT_AMBULATORY_CARE_PROVIDER_SITE_OTHER): Payer: Managed Care, Other (non HMO) | Admitting: Adult Health

## 2020-05-14 VITALS — BP 126/82 | HR 98 | Temp 97.2°F | Wt 242.2 lb

## 2020-05-14 DIAGNOSIS — R0989 Other specified symptoms and signs involving the circulatory and respiratory systems: Secondary | ICD-10-CM

## 2020-05-14 DIAGNOSIS — J209 Acute bronchitis, unspecified: Secondary | ICD-10-CM | POA: Diagnosis not present

## 2020-05-14 DIAGNOSIS — Z87891 Personal history of nicotine dependence: Secondary | ICD-10-CM

## 2020-05-14 DIAGNOSIS — R05 Cough: Secondary | ICD-10-CM | POA: Diagnosis not present

## 2020-05-14 DIAGNOSIS — J069 Acute upper respiratory infection, unspecified: Secondary | ICD-10-CM | POA: Diagnosis not present

## 2020-05-14 DIAGNOSIS — R058 Other specified cough: Secondary | ICD-10-CM

## 2020-05-14 MED ORDER — AZITHROMYCIN 250 MG PO TABS: ORAL_TABLET | ORAL | 1 refills | Status: AC

## 2020-05-14 MED ORDER — PROMETHAZINE-DM 6.25-15 MG/5ML PO SYRP: 5.0000 mL | ORAL_SOLUTION | Freq: Four times a day (QID) | ORAL | 1 refills | Status: DC | PRN

## 2020-05-14 MED ORDER — PREDNISONE 20 MG PO TABS: ORAL_TABLET | ORAL | 0 refills | Status: DC

## 2020-05-14 NOTE — Patient Instructions (Addendum)
Chest xray at [facility] on [address] towards [place]  Take R on [address] at the gas station - imaging center is right behind it Walk in and give your name  Breztri 1 puff twice a day (after taking cough medication if needed)  Rinse mouth out and gargle well to avoid thrush   HOW TO TREAT VIRAL COUGH AND COLD SYMPTOMS:  -Symptoms usually last at least 1 week with the worst symptoms being around day 4.  - colds usually start with a sore throat and end with a cough, and the cough can take 2 weeks to get better.  -No antibiotics are needed for colds, flu, sore throats, cough, bronchitis UNLESS symptoms are longer than 7 days OR if you are getting better then get drastically worse.  -There are a lot of combination medications (Dayquil, Nyquil, Vicks 44, tyelnol cold and sinus, ETC). Please look at the ingredients on the back so that you are treating the correct symptoms and not doubling up on medications/ingredients.    Medicines you can use  Nasal congestion  Little Remedies saline spray (aerosol/mist)- can try this, it is in the kids section - pseudoephedrine (Sudafed)- behind the counter, do not use if you have high blood pressure, medicine that have -D in them.  - phenylephrine (Sudafed PE) -Dextormethorphan + chlorpheniramine (Coridcidin HBP)- okay if you have high blood pressure -Oxymetazoline (Afrin) nasal spray- LIMIT to 3 days -Saline nasal spray -Neti pot (used distilled or bottled water)  Ear pain/congestion  -pseudoephedrine (sudafed) - Nasonex/flonase nasal spray  Fever  -Acetaminophen (Tyelnol) -Ibuprofen (Advil, motrin, aleve)  Sore Throat  -Acetaminophen (Tyelnol) -Ibuprofen (Advil, motrin, aleve) -Drink a lot of water -Gargle with salt water - Rest your voice (don't talk) -Throat sprays -Cough drops  Body Aches  -Acetaminophen (Tyelnol) -Ibuprofen (Advil, motrin, aleve)  Headache  -Acetaminophen (Tyelnol) -Ibuprofen (Advil,  motrin, aleve) - Exedrin, Exedrin Migraine  Allergy symptoms (cough, sneeze, runny nose, itchy eyes) -Claritin or loratadine cheapest but likely the weakest  -Zyrtec or certizine at night because it can make you sleepy -The strongest is allegra or fexafinadine  Cheapest at [employer], [employer], [employer]  Cough  -Dextromethorphan (Delsym)- medicine that has DM in it -Guafenesin (Mucinex/Robitussin) - cough drops - drink lots of water  Chest Congestion  -Guafenesin (Mucinex/Robitussin)  Red Itchy Eyes  - Naphcon-A  Upset Stomach  - Bland diet (nothing spicy, greasy, fried, and high acid foods like tomatoes, oranges, berries) -OKAY- cereal, bread, soup, crackers, rice -Eat smaller more frequent meals -reduce caffeine, no alcohol -Loperamide (Imodium-AD) if diarrhea -Prevacid for heart burn  General health when sick  -Hydration -wash your hands frequently -keep surfaces clean -change pillow cases and sheets often -Get fresh air but do not exercise strenuously -Vitamin D, double up on it - Vitamin C -Zinc

## 2020-05-14 NOTE — Progress Notes (Addendum)
Assessment and Plan:  Maureen [name] was seen today for cough.  Diagnoses and all orders for this visit:  URI with cough and congestion Covid rapid screen negative, pending PCR in vaccinated Former smoker with URI + bronchitis vs possible early pneumonia sx with progressive productive cough, abn lung sounds and fatigue Suggested symptomatic OTC remedies. Nasal saline spray/nasal steroid for congestion. Continue allergy pill, oral steroids offered, started zpak due to risk factors, pending CXR r/o pneumonia requiring follow up Go to the ER if any chest pain, shortness of breath, nausea, dizziness, severe HA, changes vision/speech Follow up as needed. -     promethazine-dextromethorphan (PROMETHAZINE-DM) 6.25-15 MG/5ML syrup; Take 5 mLs by mouth 4 (four) times daily as needed for cough. -     predniSONE (DELTASONE) 20 MG tablet; 2 tablets daily for 3 days, 1 tablet daily for 4 days. -     azithromycin (ZITHROMAX) 250 MG tablet; Take 2 tablets (500 mg) on  Day 1,  followed by 1 tablet (250 mg) once daily on Days 2 through 5.  Productive cough Abnormal lung sounds Former smoker Bronchitis vs r/o pneumonia due to increasingly productive cough, fatigue Did start on breztri - 2 week sample provided Follow up if any worsening sx, dyspnea, CP -     DG Chest 2 View; Future  Addendum:  CXR suggestive of acute bronchitis, no appearance of pneumonia. Continue plan per above -   Further disposition pending results of labs. Discussed med's effects and SE's.   Over 30 minutes of exam, counseling, chart review, and critical decision making was performed.   Future Appointments  Date Time Provider [place]  08/08/2020  2:00 PM Garnet Sierras, NP [facility] None  10/15/2020  2:15 PM Brendolyn Patty, MD [facility] None  11/14/2020  4:00 PM [name], MD [facility]    ------------------------------------------------------------------------------------------------------------------   HPI BP  126/82   Pulse 98   Temp (!) 97.2 F (36.2 C)   Wt 242 lb 3.2 oz (109.9 kg)   SpO2 96%   BMI 45.76 kg/m   64 y.o.female former smoker (casually light smoker, quit in 2000) with htn, morbid obesity presents for evaluation of 4-5 days of URI sx with cough.   She reports got flu vaccine (first time getting high dose) 05/08/2020. She reports the next day started feeling achy, attributed to vaccine, then developed sore throat, then started coughing. 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 Has had watery/itchy eyes.  She reports fairly severe progressive cough, reports large quantity of thick yellow mucus (2-3 tsp per hour). She denies chest pain, dyspnea, wheezing. Speaks in complete sentences.  Endorses body aches, mostly r/t coughing. Mild headache, improves with tylenol, also endorses sinus pressure, nasal congestion.  Sx were the worst yesterday, very fatigued, malaise. Slightly improved today.  She denies fever, 98.1, did have chills and sense of fever yesterday, but improved overall today. Just cough remains severe.   She does endorse spring/fall allergies, takes claritin year round, has rotated agent recently.  She has been using Hall's cough drops, generic sinus med without much improvement, tylenol helped the most  She had rapid covid 19 home test negative on 05/10/2020, also has PCR obtained on 05/13/2020 which is pending.  She 2/2 pfizer covid 19 vaccination in March 2021.  No know sick contacts  Past Medical History:  Diagnosis Date  . Arthritis    LEFT KNEE, LOWER BACK  . GERD (gastroesophageal reflux disease)   . History of hiatal  hernia   . History of obstructive sleep apnea    per pt history osa w/ cpap until wt loss after gastric band, retested negative for sleep apnea  . Hypertension   . IBS (irritable bowel syndrome)   . Numbness    BOTTOM LEFT FOOT  . PONV (postoperative nausea and vomiting)    PONV with gallbladder   . Seasonal allergies   . Sinus  drainage   . Snores    WEARS NIGHT GUARD  . SUI (stress urinary incontinence, female)      Allergies  Allergen Reactions  . Mucinex [Guaifenesin Er] Other (See Comments)    "feels weird and dizzy"  . Tramadol Itching    Current Outpatient Medications on File Prior to Visit  Medication Sig  . Cholecalciferol (VITAMIN D-3) 5000 units TABS Take 5,000 Units by mouth daily.  Marland Kitchen ELDERBERRY PO Take by mouth.  . hydrochlorothiazide (HYDRODIURIL) 12.5 MG tablet Take 1 tablet Daily for BP & Fluid  . lisinopril (ZESTRIL) 40 MG tablet Take 1 tablet Daily for BP  . loratadine (CLARITIN) 10 MG tablet Take 10 mg by mouth every morning.   . Multiple Vitamin (MULTIVITAMIN) capsule Take 1 capsule by mouth daily.   Marland Kitchen omeprazole (PRILOSEC) 20 MG capsule Take 1 capsule Daily for Indigestion & Heartburn  . venlafaxine XR (EFFEXOR-XR) 75 MG 24 hr capsule Take 1 capsule Daily for Mood  . Zinc 15 MG CAPS Take by mouth.   No current facility-administered medications on file prior to visit.    ROS: all negative except above.   Physical Exam:  BP 126/82   Pulse 98   Temp (!) 97.2 F (36.2 C)   Wt 242 lb 3.2 oz (109.9 kg)   SpO2 96%   BMI 45.76 kg/m   General Appearance: Well nourished, well dressed, morbidly obese female in no apparent distress. Eyes: PERRLA, EOMs, conjunctiva no swelling or erythema Sinuses: No notable Frontal/maxillary tenderness ENT/Mouth: Ext aud canals very narrow with soft wax, TMs not visualized, no auricle or tragal tenderness. No erythema, swelling, or exudate on post pharynx.  Tonsils not swollen or erythematous. Hearing normal.  Neck: Supple Respiratory: Respiratory effort normal, BS present throughout with generalized rhonchi, no wheezing or stridor.  Cardio: RRR with no MRGs. Brisk peripheral pulses without edema.  Abdomen: Soft, obese abdomen + BS.  Non tender, no guarding, rebound. Lymphatics: Non tender without lymphadenopathy.  Musculoskeletal: no obvious  deformity, normal gait.  Skin: Warm, dry without rashes, lesions, ecchymosis.  Neuro: Normal muscle tone, no cerebellar symptoms. Sensation intact.  Psych: Awake and oriented X 3, normal affect, Insight and Judgment appropriate.     Izora Ribas, NP 2:42 PM [facility]

## 2020-05-20 ENCOUNTER — Other Ambulatory Visit: Payer: Self-pay | Admitting: Adult Health

## 2020-05-20 DIAGNOSIS — J40 Bronchitis, not specified as acute or chronic: Secondary | ICD-10-CM

## 2020-05-20 MED ORDER — BUDESONIDE-FORMOTEROL FUMARATE 160-4.5 MCG/ACT IN AERO: 2.0000 | INHALATION_SPRAY | Freq: Two times a day (BID) | RESPIRATORY_TRACT | 2 refills | Status: DC

## 2020-05-20 MED ORDER — PREDNISONE 20 MG PO TABS: ORAL_TABLET | ORAL | 0 refills | Status: DC

## 2020-06-25 ENCOUNTER — Other Ambulatory Visit: Payer: Self-pay | Admitting: Internal Medicine

## 2020-07-05 ENCOUNTER — Encounter: Payer: Managed Care, Other (non HMO) | Admitting: Physician Assistant

## 2020-07-10 ENCOUNTER — Other Ambulatory Visit: Payer: Self-pay | Admitting: Internal Medicine

## 2020-08-08 ENCOUNTER — Other Ambulatory Visit: Payer: Self-pay

## 2020-08-08 ENCOUNTER — Encounter: Payer: Self-pay | Admitting: Adult Health Nurse Practitioner

## 2020-08-08 ENCOUNTER — Ambulatory Visit (INDEPENDENT_AMBULATORY_CARE_PROVIDER_SITE_OTHER): Payer: Managed Care, Other (non HMO) | Admitting: Adult Health Nurse Practitioner

## 2020-08-08 VITALS — BP 136/82 | HR 63 | Temp 97.3°F | Ht 61.0 in | Wt 245.0 lb

## 2020-08-08 DIAGNOSIS — R6889 Other general symptoms and signs: Secondary | ICD-10-CM | POA: Diagnosis not present

## 2020-08-08 DIAGNOSIS — K589 Irritable bowel syndrome without diarrhea: Secondary | ICD-10-CM

## 2020-08-08 DIAGNOSIS — R7309 Other abnormal glucose: Secondary | ICD-10-CM

## 2020-08-08 DIAGNOSIS — M1990 Unspecified osteoarthritis, unspecified site: Secondary | ICD-10-CM

## 2020-08-08 DIAGNOSIS — Z136 Encounter for screening for cardiovascular disorders: Secondary | ICD-10-CM

## 2020-08-08 DIAGNOSIS — K2100 Gastro-esophageal reflux disease with esophagitis, without bleeding: Secondary | ICD-10-CM

## 2020-08-08 DIAGNOSIS — Z9884 Bariatric surgery status: Secondary | ICD-10-CM

## 2020-08-08 DIAGNOSIS — Z87891 Personal history of nicotine dependence: Secondary | ICD-10-CM

## 2020-08-08 DIAGNOSIS — E785 Hyperlipidemia, unspecified: Secondary | ICD-10-CM

## 2020-08-08 DIAGNOSIS — H6120 Impacted cerumen, unspecified ear: Secondary | ICD-10-CM | POA: Diagnosis not present

## 2020-08-08 DIAGNOSIS — E559 Vitamin D deficiency, unspecified: Secondary | ICD-10-CM

## 2020-08-08 DIAGNOSIS — H9201 Otalgia, right ear: Secondary | ICD-10-CM | POA: Diagnosis not present

## 2020-08-08 DIAGNOSIS — K580 Irritable bowel syndrome with diarrhea: Secondary | ICD-10-CM

## 2020-08-08 DIAGNOSIS — Z130 Encounter for screening for diseases of the blood and blood-forming organs and certain disorders involving the immune mechanism: Secondary | ICD-10-CM

## 2020-08-08 DIAGNOSIS — Z1321 Encounter for screening for nutritional disorder: Secondary | ICD-10-CM

## 2020-08-08 DIAGNOSIS — Z0001 Encounter for general adult medical examination with abnormal findings: Secondary | ICD-10-CM | POA: Diagnosis not present

## 2020-08-08 DIAGNOSIS — Z79899 Other long term (current) drug therapy: Secondary | ICD-10-CM

## 2020-08-08 DIAGNOSIS — R300 Dysuria: Secondary | ICD-10-CM

## 2020-08-08 DIAGNOSIS — N951 Menopausal and female climacteric states: Secondary | ICD-10-CM

## 2020-08-08 DIAGNOSIS — J3089 Other allergic rhinitis: Secondary | ICD-10-CM

## 2020-08-08 DIAGNOSIS — I10 Essential (primary) hypertension: Secondary | ICD-10-CM | POA: Diagnosis not present

## 2020-08-08 MED ORDER — DICYCLOMINE HCL 10 MG PO CAPS: 10.0000 mg | ORAL_CAPSULE | Freq: Four times a day (QID) | ORAL | 0 refills | Status: DC

## 2020-08-08 NOTE — Progress Notes (Signed)
COMPLETE PHYSICAL  LABS DONE AT [facility], PATIENT WORKS THERE AND THEY ARE FREE- GIVEN LAB RX    Assessment / Plan:    Encounter for routine medical examination with abnormal findings. Yearly  Essential hypertension Continue current medications:HCTZ 12.5mg  daily in am, zestril 40mg   Monitor blood pressure at home; call if consistently over 130/80 Continue DASH diet.   Reminder to go to the ER if any CP, SOB, nausea, dizziness, severe HA, changes vision/speech, left arm numbness and tingling and jaw pain.   Abnormal glucose Discussed general issues about diabetes pathophysiology and management., Educational material distributed., Suggested low cholesterol diet., Encouraged aerobic exercise., Discussed foot care., Reminded to get yearly retinal exam.   Hyperlipidemia -continue medications, check lipids, decrease fatty foods, increase activity.   Morbid obesity - follow up 3 months for progress monitoring - increase veggies, decrease carbs - long discussion about weight loss, diet, and exercise - CONTINUE WEIGHT LOSS, WEIGHT LOSS GOAL IS 180   LAP-BAND surgery status Follow up Dr. Hassell Done   Asthma, unspecified asthma severity, uncomplicated Asthma controlled   IBS (irritable bowel syndrome) Diet controlled   Gastroesophageal reflux disease with esophagitis Continue PPI/H2 blocker, diet discussed  Osteoarthritis, unspecified osteoarthritis type, unspecified site Managing well at this time  Non-seasonal allergic rhinitis, unspecified trigger Managing well at this time Uses Clairatin OTC PRN  Vitamin D deficiency Continue supplementation to maintain goal of 70-100 Taking Vitamin D 5,000 IU daily Defer vitamin D level  Former smoker Monitor  Dysuria -UA  Medication management Continued  Irritable bowel syndrome with diarrhea -     dicyclomine (BENTYL) 10 MG capsule; Take 1 capsule (10 mg total) by mouth 4 (four) times daily for 14 days.  Abnormal  glucose Discussed dietary and exercise modifications  Encounter for vitamin deficiency screening -B12  Screening, iron deficiency anemia -Iron panel  Otalgia, right -     Ear Lavage  Impacted cerumen, unspecified laterality Tolerated well [place] clear, TM's intact. Ear hygiene discussed.  Menopausal vasomotor symptoms Taking Effexor 75mg    Discussed med's effects and SE's. Screening labs and tests as requested with regular follow-up as recommended.Gets labs done at [facility] disposition pending results if labs check today. Discussed med's effects and SE's.   Over 40 minutes of face to face interview, exam, counseling, chart review, and critical decision making was performed.    HPI 64 y.o. female  presents for a complete physical and follow up on HTN, HLD Weight, viamin D defciency and abnormal glucose.  Still working at [employer], retired, benefits for about 81more months.  No health or medication concerns today.   She reports that she has had a change in her smell.  She reports over the past three months and it is intermittent in nature.  She reports things that are warm or hot have a bad smell.  She reports she is not able to describe the smell but  BMI is Body mass index is 46.29 kg/m., she is working on diet and exercise. She is on optivia x August and has lost 30 lbs since that time. She states she is walking better and doing better.  Wt Readings from Last 3 Encounters:  08/08/20 245 lb (111.1 kg)  05/14/20 242 lb 3.2 oz (109.9 kg)  02/08/20 231 lb (104.8 kg)   Her blood pressure has been controlled at home, runs 120's at home, on lisinopril 40 and HCTZ 12.5mg  today their BP is BP: 136/82. She had her She denies chest  pain, shortness of breath, dizziness.   Her cholesterol is diet controlled. Her cholesterol is controlled. She is NOT fasting for labs today. The cholesterol last visit was:  Lab Results  Component Value Date   CHOL 193 08/09/2020   HDL 57  08/09/2020   LDLCALC 95 08/09/2020   TRIG 245 (H) 08/09/2020   She has been working on diet and exercise for prediabetes, and denies blurry vision, polydipsia, polyphagia and polyuria. Last A1C in the office was: Lab Results  Component Value Date   HGBA1C 5.6 08/09/2020  Patient is on Vitamin D supplements.  Lab Results  Component Value Date   VD25OH 62.1 08/09/2020   She is on prilosec for GERD. She is on effexor for IBS/diarrhea, she feels that it is not helping but would like to stay on it to prevent hot flashes.    She has lower back pain with sciatica left leg occ, managing well at this time.  Current Medications:  Current Outpatient Medications on File Prior to Visit  Medication Sig Dispense Refill  . budesonide-formoterol (SYMBICORT) 160-4.5 MCG/ACT inhaler Inhale 2 puffs into the lungs in the morning and at bedtime. Rinse mouth well after each use. 1 each 2  . Cholecalciferol (VITAMIN D-3) 5000 units TABS Take 5,000 Units by mouth daily.    Marland Kitchen ELDERBERRY PO Take by mouth.    . hydrochlorothiazide (HYDRODIURIL) 12.5 MG tablet Take      1 tablet       Daily         for BP & Fluid  Retention 90 tablet 0  . lisinopril (ZESTRIL) 40 MG tablet Take 1 tablet Daily for BP 90 tablet 3  . loratadine (CLARITIN) 10 MG tablet Take 10 mg by mouth every morning.     . Multiple Vitamin (MULTIVITAMIN) capsule Take 1 capsule by mouth daily.     Marland Kitchen omeprazole (PRILOSEC) 20 MG capsule Take 1 capsule Daily for Indigestion & Heartburn 90 capsule 3  . venlafaxine XR (EFFEXOR-XR) 75 MG 24 hr capsule Take     1 capsule     Daily      for Mood 90 capsule 0  . Zinc 15 MG CAPS Take by mouth.     No current facility-administered medications on file prior to visit.   Health Maintenance:  Immunization History  Administered Date(s) Administered  . Fluad Quad(high Dose 65+) 05/08/2020  . Influenza Inj Mdck Quad With Preservative 06/27/2018  . Influenza,inj,Quad PF,6+ Mos 06/06/2017, 05/19/2019  .  Influenza-Unspecified 07/11/2013, 06/23/2017  . PFIZER SARS-COV-2 Vaccination 11/01/2019, 11/22/2019  . Td 04/28/2006  . Tdap 12/04/2015   Tetanus: 2017 Pneumovax: 2021 Prevnar 13: due at 15 Flu vaccine: 2020 Zostavax: discussed  Pap: Feb 2020, has OV next year MGM: 12/2017 3D neg CAT B at [facility]- (Dr. Dellis Filbert)  DEXA: 2010 normal- non smoker, no family history, BMI elevated, no broken bones- will schedule with Dr. Dellis Filbert Feb/2022 Colonoscopy: November 04 2018 (Dr. Collene Mares) EGD: N/A Myoview stress test 10/2001 AB [place] 2003  DEE 01/2015 Dr. Gaylyn Cheers Dentist: Dr. Carman Ch[place] in [place], q 6 months Derm Dr. Nicole Kindred, [place]  Medical History:  Past Medical History:  Diagnosis Date  . Arthritis    LEFT KNEE, LOWER BACK  . GERD (gastroesophageal reflux disease)   . History of hiatal hernia   . History of obstructive sleep apnea    per pt history osa w/ cpap until wt loss after gastric band, retested negative for sleep apnea  . Hypertension   .  IBS (irritable bowel syndrome)   . Numbness    BOTTOM LEFT FOOT  . PONV (postoperative nausea and vomiting)    PONV with gallbladder   . Seasonal allergies   . Sinus drainage   . Snores    WEARS NIGHT GUARD  . SUI (stress urinary incontinence, female)    Allergies Allergies  Allergen Reactions  . Mucinex [Guaifenesin Er] Other (See Comments)    "feels weird and dizzy"  . 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 FAMILY HISTORY Her family history includes Cancer in her paternal grandfather; Heart disease in her father; Hyperlipidemia in her father. SOCIAL HISTORY She  reports that she quit smoking about 21 years ago. Her smoking use included cigarettes. She has a 7.00 pack-year smoking history. She has never used smokeless tobacco. She reports that she does not drink alcohol and does not use drugs.  ROS  Physical Exam: Blood pressure 136/82, pulse 63, temperature (!) 97.3 F (36.3 C), height 5\' 1"  (1.549 m), weight 245 lb (111.1 kg), SpO2 97 %. Body mass index is 46.29 kg/m. General Appearance: Well nourished, in no apparent distress. Eyes: PERRLA, EOMs, conjunctiva no swelling or erythema, normal fundi and vessels. Sinuses: No Frontal/maxillary tenderness ENT/Mouth: Ext aud canals clear, normal light reflex with TMs without erythema, bulging.  Good dentition. No erythema, swelling, or exudate on post pharynx. Tonsils not swollen or erythematous. Hearing normal.  Neck: Supple, thyroid normal. No bruits Respiratory: Respiratory effort normal, BS equal bilaterally without rales, rhonchi, wheezing or stridor. Cardio: RRR without murmurs, rubs or gallops. Brisk peripheral pulses without edema.  Chest: symmetric, with normal excursions and percussion. Breasts: defer Abdomen: Soft, +BS, obese, notenderness, no guarding, rebound, hernias, masses, or organomegaly. .  Lymphatics: Non tender without lymphadenopathy.  Genitourinary: defer Musculoskeletal: Full ROM all peripheral extremities,5/5 strength, and antalgic gait with cane. Skin: left cheek near neck with flesh colored nodule 2x3, no telangiectasias, erythema, will monitor. Warm, dry without rashes, lesions, ecchymosis.  Neuro: Cranial nerves intact, reflexes equal bilaterally. Normal muscle tone, no  cerebellar symptoms. Sensation intact.  Psych: Awake and oriented X 3, normal affect, Insight and Judgment appropriate.    EKG: EKG WNL     Bayard Males, DNP [facility] 08/19/2020  9:34 AM

## 2020-08-08 NOTE — Patient Instructions (Addendum)
We send in a prescription for Dicyclomine (bentyl) take one tablet before meals.  You can take up to four times a day.  This will help with bloating, cramps and loose stools.  You are due for Shingles vaccination.  Consider after next year.  Will respond with your lab results via [facility].   Ear care: Both of your ears are clear of wax at this time Use 2-3 drops of oil for next 3-4 nights.   You can use olive oil or mineral oil This will help to sooth your ear canals.  Use cotton ball to keep in ears while sleeping.  For regular maintenance: Use warm or room temp peroxide in ear once every 1-2 weeks, then apply oil that night x1.   Next morning let warm water from shower run into your ears.   This will help flush out any wax Do not use cotton swabs in your ears Should you have pain, decrease in your ability to hear or dizziness please contact the office for an appointment.    GENERAL HEALTH GOALS  Know what a healthy weight is for you (roughly BMI <25) and aim to maintain this  Aim for 7+ servings of fruits and vegetables daily  70-80+ fluid ounces of water or unsweet tea for healthy kidneys  Limit to max 1 drink of alcohol per day; avoid smoking/tobacco  Limit animal fats in diet for cholesterol and heart health - choose grass fed whenever available  Avoid highly processed foods, and foods high in saturated/trans fats  Aim for low stress - take time to unwind and care for your mental health  Aim for 150 min of moderate intensity exercise weekly for heart health, and weights twice weekly for bone health  Aim for 7-9 hours of sleep daily

## 2020-08-09 ENCOUNTER — Other Ambulatory Visit: Payer: Self-pay | Admitting: Adult Health Nurse Practitioner

## 2020-08-10 LAB — SPECIMEN STATUS REPORT

## 2020-08-13 ENCOUNTER — Other Ambulatory Visit: Payer: Self-pay | Admitting: Adult Health Nurse Practitioner

## 2020-08-13 DIAGNOSIS — N3000 Acute cystitis without hematuria: Secondary | ICD-10-CM

## 2020-08-13 MED ORDER — NITROFURANTOIN MONOHYD MACRO 100 MG PO CAPS: 100.0000 mg | ORAL_CAPSULE | Freq: Two times a day (BID) | ORAL | 0 refills | Status: DC

## 2020-08-17 LAB — COMPREHENSIVE METABOLIC PANEL
ALT: 22 IU/L (ref 0–32)
AST: 19 IU/L (ref 0–40)
Albumin/Globulin Ratio: 1.7 (ref 1.2–2.2)
Albumin: 4.3 g/dL (ref 3.8–4.8)
Alkaline Phosphatase: 105 IU/L (ref 44–121)
BUN/Creatinine Ratio: 25 (ref 12–28)
BUN: 18 mg/dL (ref 8–27)
Bilirubin Total: 0.4 mg/dL (ref 0.0–1.2)
CO2: 27 mmol/L (ref 20–29)
Calcium: 9.7 mg/dL (ref 8.7–10.3)
Chloride: 99 mmol/L (ref 96–106)
Creatinine, Ser: 0.71 mg/dL (ref 0.57–1.00)
GFR calc Af Amer: 103 mL/min/{1.73_m2} (ref >59)
GFR calc non Af Amer: 90 mL/min/{1.73_m2} (ref >59)
Globulin, Total: 2.5 g/dL (ref 1.5–4.5)
Glucose: 105 mg/dL — ABNORMAL HIGH (ref 65–99)
Potassium: 4.2 mmol/L (ref 3.5–5.2)
Sodium: 139 mmol/L (ref 134–144)
Total Protein: 6.8 g/dL (ref 6.0–8.5)

## 2020-08-17 LAB — IRON: Iron: 111 ug/dL (ref 27–139)

## 2020-08-17 LAB — CBC WITH DIFFERENTIAL/PLATELET
Basophils Absolute: 0 10*3/uL (ref 0.0–0.2)
Basos: 1 %
EOS (ABSOLUTE): 0.2 10*3/uL (ref 0.0–0.4)
Eos: 3 %
Hematocrit: 41.2 % (ref 34.0–46.6)
Hemoglobin: 14 g/dL (ref 11.1–15.9)
Immature Grans (Abs): 0 10*3/uL (ref 0.0–0.1)
Immature Granulocytes: 0 %
Lymphocytes Absolute: 2.1 10*3/uL (ref 0.7–3.1)
Lymphs: 36 %
MCH: 33.2 pg — ABNORMAL HIGH (ref 26.6–33.0)
MCHC: 34 g/dL (ref 31.5–35.7)
MCV: 98 fL — ABNORMAL HIGH (ref 79–97)
Monocytes Absolute: 0.3 10*3/uL (ref 0.1–0.9)
Monocytes: 6 %
Neutrophils Absolute: 3.1 10*3/uL (ref 1.4–7.0)
Neutrophils: 54 %
Platelets: 272 10*3/uL (ref 150–450)
RBC: 4.22 x10E6/uL (ref 3.77–5.28)
RDW: 12.2 % (ref 11.7–15.4)
WBC: 5.7 10*3/uL (ref 3.4–10.8)

## 2020-08-17 LAB — MICROSCOPIC EXAMINATION
Casts: NONE SEEN /lpf
RBC, Urine: NONE SEEN /hpf (ref 0–2)

## 2020-08-17 LAB — URINALYSIS, COMPLETE
Bilirubin, UA: NEGATIVE
Glucose, UA: NEGATIVE
Ketones, UA: NEGATIVE
Leukocytes,UA: NEGATIVE
Nitrite, UA: NEGATIVE
Protein,UA: NEGATIVE
RBC, UA: NEGATIVE
Specific Gravity, UA: 1.017 (ref 1.005–1.030)
Urobilinogen, Ur: 0.2 mg/dL (ref 0.2–1.0)
pH, UA: 5.5 (ref 5.0–7.5)

## 2020-08-17 LAB — MICROALBUMIN / CREATININE URINE RATIO
Creatinine, Urine: 77 mg/dL
Microalb/Creat Ratio: <4 mg/g creat (ref 0–29)
Microalbumin, Urine: <3 ug/mL

## 2020-08-17 LAB — URINE CULTURE

## 2020-08-17 LAB — LIPID PANEL W/O CHOL/HDL RATIO
Cholesterol, Total: 193 mg/dL (ref 100–199)
HDL: 57 mg/dL (ref >39)
LDL Chol Calc (NIH): 95 mg/dL (ref 0–99)
Triglycerides: 245 mg/dL — ABNORMAL HIGH (ref 0–149)
VLDL Cholesterol Cal: 41 mg/dL — ABNORMAL HIGH (ref 5–40)

## 2020-08-17 LAB — VITAMIN B12: Vitamin B-12: 634 pg/mL (ref 232–1245)

## 2020-08-17 LAB — MAGNESIUM: Magnesium: 1.9 mg/dL (ref 1.6–2.3)

## 2020-08-17 LAB — TSH: TSH: 4.5 u[IU]/mL (ref 0.450–4.500)

## 2020-08-17 LAB — HGB A1C W/O EAG: Hgb A1c MFr Bld: 5.6 % (ref 4.8–5.6)

## 2020-08-17 LAB — VITAMIN D 25 HYDROXY (VIT D DEFICIENCY, FRACTURES): Vit D, 25-Hydroxy: 62.1 ng/mL (ref 30.0–100.0)

## 2020-09-11 ENCOUNTER — Other Ambulatory Visit: Payer: Self-pay | Admitting: Internal Medicine

## 2020-10-15 ENCOUNTER — Encounter: Payer: Commercial Managed Care - PPO | Admitting: Dermatology

## 2020-10-17 ENCOUNTER — Other Ambulatory Visit: Payer: Self-pay | Admitting: Internal Medicine

## 2020-11-04 NOTE — Progress Notes (Signed)
Assessment and Plan:  Maureen [name] was seen today for acute visit.  Diagnoses and all orders for this visit:  Morbid obesity ([place]) Impaired fasting glucose S/p lap band and regained weight Discussed semaglutide may be beneficial for significant weigth loss to avoid repeat surgical intervention; discussed risks/benefits at length and wants to proceed First dose given in office today with 6 weeks supply If tolerating 0.5 mg dose well for 8 weeks increase to 1 mg daily  Follow up 3 months or sooner if any concerns -     Semaglutide,0.25 or 0.5MG /DOS, (OZEMPIC, 0.25 OR 0.5 MG/DOSE,) 2 MG/1.5ML SOPN; Inject 0.5 mg into the skin once a week.  LAP-BAND surgery status F/u Dr. Hassell Done if persistent gurgling despite weight loss with above  Non-seasonal allergic rhinitis, unspecified trigger Allergy hygiene explained - check irritants in room, air purifier, humidifier, avoid vents on face Continue daily antihistamine; try saline irrigation then flonase 1-2 sprays in each nostril at HS Follow up if not improving;  -     fluticasone (FLONASE) 50 MCG/ACT nasal spray; Place 1-2 sprays into both nostrils at bedtime.  History of sleep apnea On oral device by dentist; discussed repeat sleep study referral; declines at this time Will work on weight loss with semaglutide;  Encouraged follow up with dentist about efficacy with current oral device  Further disposition pending results of labs. Discussed med's effects and SE's.   Over 30 minutes of exam, counseling, chart review, and critical decision making was performed.   Future Appointments  Date Time Provider [place]  11/14/2020  4:00 PM Maureen Bruins, MD [facility] None  02/27/2021  2:30 PM Maureen Comber, NP [facility] None  08/11/2021  2:00 PM McClanahan, Maureen Sewer, NP [facility] None    ------------------------------------------------------------------------------------------------------------------   HPI BP 140/90    Pulse 86    Temp (!)  96.1 F (35.6 C)    Wt 249 lb (112.9 kg)    SpO2 95%    BMI 47.05 kg/m   65 y.o.female with morbid obesity, htn, allergic rhinitis presents for evaluation due to concerns with abnormal chest gurgling and nasal secretions at night.   She reports ongoing for 3 years or so, has noted gurgling noises up in her chest, felt was manageable, but recently has noted very loud noises and gurgling sensation and loud noises will wake her up. Denies pain, nausea, reflux (on omeprazole 20 mg and denies any breakthrough)  Will have on back or side, but worst on stomach, will trigger without fail. She had lap band in 2016 by Dr. Hassell Done, 253 lb peak, no problems immediately after, lost 84 lb, but then had an illness and has regained weight, believes this preceded her sx, did follow up with Dr. Melford Aase prior to the pandemic, discussed possible reversal and sleeve if persistent. Denies dysphagia, chest pain, dyspnea, wheezing.   Will wake up coughing, will have lots of mucus and drainage in her throat, will wake up with sinuses full and headache will do vics vapo rub, also has been taking claritin or other antihistamine daily (rotates agent), hasn't tried nasal sprays. She denies changes in home, same pets. Only has sx if sleeping. Has tried sleeping sitting up in recliner with similar results. Denies snoring, AM fatigue, HA, daytime somnolence.   She does have hx of OSA that had resolved with weight loss, continues to treat per patient with oral device from dentist - has upcoming follow up.  BMI is Body mass index is 47.05 kg/m., Has been working on diet  and exercise, just retired and trying to be more active. Discussed semaglutide as new option; she is very interested in trying this prior to follow up with Dr. Melford Aase.  Wt Readings from Last 3 Encounters:  11/05/20 249 lb (112.9 kg)  08/08/20 245 lb (111.1 kg)  05/14/20 242 lb 3.2 oz (109.9 kg)    CXR 05/14/2020 IMPRESSION: Mild central airway thickening  suggesting bronchitis. No evidence of pneumonia.  Denies daytime exertional dyspnea, she feels secretions are coming from above rather than below. Has inhaler that used after bronchitis but stopped using after resolved and hasn't noted any difference.    Past Medical History:  Diagnosis Date   Arthritis    LEFT KNEE, LOWER BACK   GERD (gastroesophageal reflux disease)    History of hiatal hernia    History of obstructive sleep apnea    per pt history osa w/ cpap until wt loss after gastric band, retested negative for sleep apnea   Hypertension    IBS (irritable bowel syndrome)    Numbness    BOTTOM LEFT FOOT   PONV (postoperative nausea and vomiting)    PONV with gallbladder    Seasonal allergies    Sinus drainage    Snores    WEARS NIGHT GUARD   SUI (stress urinary incontinence, female)      Allergies  Allergen Reactions   Mucinex [Guaifenesin Er] Other (See Comments)    "feels weird and dizzy"   Tramadol Itching    Current Outpatient Medications on File Prior to Visit  Medication Sig   Cholecalciferol (VITAMIN D-3) 5000 units TABS Take 5,000 Units by mouth daily.   ELDERBERRY PO Take by mouth.   hydrochlorothiazide (HYDRODIURIL) 12.5 MG tablet Take 1 tablet Daily for BP & to Prevent Fluid Retention /Ankle Swelling   lisinopril (ZESTRIL) 40 MG tablet Take     1 tablet     Daily      for BP   loratadine (CLARITIN) 10 MG tablet Take 10 mg by mouth every morning.    Multiple Vitamin (MULTIVITAMIN) capsule Take 1 capsule by mouth daily.    omeprazole (PRILOSEC) 20 MG capsule Take  1 capsule  Daily  to Prevent Indigestion & Heartburn   venlafaxine XR (EFFEXOR-XR) 75 MG 24 hr capsule Take     1 capsule     Daily      for Mood   Zinc 15 MG CAPS Take by mouth.   budesonide-formoterol (SYMBICORT) 160-4.5 MCG/ACT inhaler Inhale 2 puffs into the lungs in the morning and at bedtime. Rinse mouth well after each use. (Patient not taking: Reported on 11/05/2020)    dicyclomine (BENTYL) 10 MG capsule Take 1 capsule (10 mg total) by mouth 4 (four) times daily for 14 days. (Patient not taking: Reported on 11/05/2020)   No current facility-administered medications on file prior to visit.    ROS: all negative except above.   Physical Exam:  BP 140/90    Pulse 86    Temp (!) 96.1 F (35.6 C)    Wt 249 lb (112.9 kg)    SpO2 95%    BMI 47.05 kg/m   General Appearance: Well nourished, in no apparent distress. Eyes: PERRLA, EOMs, conjunctiva no swelling or erythema Sinuses: No Frontal/maxillary tenderness ENT/Mouth: Ext aud canals clear, TMs without erythema, bulging. No erythema, swelling, or exudate on post pharynx.  Tonsils not swollen or erythematous. Large tongue/small oral cavity. Hearing normal.  Neck: Supple, thyroid normal.  Respiratory: Respiratory effort normal, BS equal  bilaterally without rales, rhonchi, wheezing or stridor.  Cardio: RRR with no MRGs. Brisk peripheral pulses without edema.  Abdomen: Soft, central obesity, + BS.  Mild epigastric tenderness, no guarding, rebound, palpable hernias, masses. Lymphatics: Non tender without lymphadenopathy.  Musculoskeletal: no obvious deformity, normal gait.  Skin: Warm, dry without rashes, lesions, ecchymosis.  Neuro: Normal muscle tone, no cerebellar symptoms. Sensation intact.  Psych: Awake and oriented X 3, normal affect, Insight and Judgment appropriate.     Izora Ribas, NP 1:09 PM [facility]

## 2020-11-05 ENCOUNTER — Ambulatory Visit: Payer: Managed Care, Other (non HMO) | Admitting: Adult Health

## 2020-11-05 ENCOUNTER — Encounter: Payer: Self-pay | Admitting: Adult Health

## 2020-11-05 ENCOUNTER — Other Ambulatory Visit: Payer: Self-pay

## 2020-11-05 DIAGNOSIS — J3089 Other allergic rhinitis: Secondary | ICD-10-CM

## 2020-11-05 DIAGNOSIS — R7301 Impaired fasting glucose: Secondary | ICD-10-CM | POA: Diagnosis not present

## 2020-11-05 DIAGNOSIS — Z9884 Bariatric surgery status: Secondary | ICD-10-CM | POA: Diagnosis not present

## 2020-11-05 DIAGNOSIS — Z8669 Personal history of other diseases of the nervous system and sense organs: Secondary | ICD-10-CM

## 2020-11-05 MED ORDER — OZEMPIC (0.25 OR 0.5 MG/DOSE) 2 MG/1.5ML ~~LOC~~ SOPN: 0.5000 mg | PEN_INJECTOR | SUBCUTANEOUS | 0 refills | Status: DC

## 2020-11-05 MED ORDER — FLUTICASONE PROPIONATE 50 MCG/ACT NA SUSP: 1.0000 | Freq: Every day | NASAL | 1 refills | Status: DC

## 2020-11-05 NOTE — Patient Instructions (Signed)
Try saline irrigation then flonase 1-2 sprays in each nostril at night  Try checking air filters, vacuum under bed, try air purifier/ humidifier    Will try weight loss with ozempic  0.25 mg/week for 4 weeks then 0.5 mg weekly   If tolerating well pick up script and start 0.5 mg weekly for an additional 8 weeks, then if doing well please contact me via mychart for 1 mg (max) dose which is where we see the big weight loss  If not improving or any concerns, please follow up with me     Semaglutide injection solution What is this medicine? SEMAGLUTIDE (Sem a GLOO tide) is used to improve blood sugar control in adults with type 2 diabetes. This medicine may also be sued for weight loss. This drug may also reduce the risk of heart attack or stroke if you have type 2 diabetes and risk factors for heart disease.  This medicine may be used for other purposes; ask your health care provider or pharmacist if you have questions. COMMON BRAND NAME(S): OZEMPIC What should I tell my health care provider before I take this medicine? They need to know if you have any of these conditions:  endocrine tumors (MEN 2) or if someone in your family had these tumors  eye disease, vision problems  history of pancreatitis  kidney disease  stomach problems  thyroid cancer or if someone in your family had thyroid cancer  an unusual or allergic reaction to semaglutide, other medicines, foods, dyes, or preservatives  pregnant or trying to get pregnant  breast-feeding How should I use this medicine? This medicine is for injection under the skin of your upper leg (thigh), stomach area, or upper arm. It is given once every week (every 7 days). You will be taught how to prepare and give this medicine. Use exactly as directed. Take your medicine at regular intervals. Do not take it more often than directed. If you use this medicine with insulin, you should inject this medicine and the insulin  separately. Do not mix them together. Do not give the injections right next to each other. Change (rotate) injection sites with each injection. It is important that you put your used needles and syringes in a special sharps container. Do not put them in a trash can. If you do not have a sharps container, call your pharmacist or healthcare provider to get one. A special MedGuide will be given to you by the pharmacist with each prescription and refill. Be sure to read this information carefully each time. This drug comes with INSTRUCTIONS FOR USE. Ask your pharmacist for directions on how to use this drug. Read the information carefully. Talk to your pharmacist or health care provider if you have questions. Talk to your pediatrician regarding the use of this medicine in children. Special care may be needed. Overdosage: If you think you have taken too much of this medicine contact a poison control center or emergency room at once. NOTE: This medicine is only for you. Do not share this medicine with others. What if I miss a dose? If you miss a dose, take it as soon as you can within 5 days after the missed dose. Then take your next dose at your regular weekly time. If it has been longer than 5 days after the missed dose, do not take the missed dose. Take the next dose at your regular time. Do not take double or extra doses. If you have questions about a missed  dose, contact your health care provider for advice. What may interact with this medicine?  other medicines for diabetes Many medications may cause changes in blood sugar, these include:  alcohol containing beverages  antiviral medicines for HIV or AIDS  aspirin and aspirin-like drugs  certain medicines for blood pressure, heart disease, irregular heart beat  chromium  diuretics  female hormones, such as estrogens or progestins, birth control pills  fenofibrate  gemfibrozil  isoniazid  lanreotide  female hormones or anabolic  steroids  MAOIs like Carbex, Eldepryl, Marplan, Nardil, and Parnate  medicines for weight loss  medicines for allergies, asthma, cold, or cough  medicines for depression, anxiety, or psychotic disturbances  niacin  nicotine  NSAIDs, medicines for pain and inflammation, like ibuprofen or naproxen  octreotide  pasireotide  pentamidine  phenytoin  probenecid  quinolone antibiotics such as ciprofloxacin, levofloxacin, ofloxacin  some herbal dietary supplements  steroid medicines such as prednisone or cortisone  sulfamethoxazole; trimethoprim  thyroid hormones Some medications can hide the warning symptoms of low blood sugar (hypoglycemia). You may need to monitor your blood sugar more closely if you are taking one of these medications. These include:  beta-blockers, often used for high blood pressure or heart problems (examples include atenolol, metoprolol, propranolol)  clonidine  guanethidine  reserpine This list may not describe all possible interactions. Give your health care provider a list of all the medicines, herbs, non-prescription drugs, or dietary supplements you use. Also tell them if you smoke, drink alcohol, or use illegal drugs. Some items may interact with your medicine. What should I watch for while using this medicine? Visit your doctor or health care professional for regular checks on your progress. Drink plenty of fluids while taking this medicine. Check with your doctor or health care professional if you get an attack of severe diarrhea, nausea, and vomiting. The loss of too much body fluid can make it dangerous for you to take this medicine. A test called the HbA1C (A1C) will be monitored. This is a simple blood test. It measures your blood sugar control over the last 2 to 3 months. You will receive this test every 3 to 6 months. Learn how to check your blood sugar. Learn the symptoms of low and high blood sugar and how to manage them. Always carry  a quick-source of sugar with you in case you have symptoms of low blood sugar. Examples include hard sugar candy or glucose tablets. Make sure others know that you can choke if you eat or drink when you develop serious symptoms of low blood sugar, such as seizures or unconsciousness. They must get medical help at once. Tell your doctor or health care professional if you have high blood sugar. You might need to change the dose of your medicine. If you are sick or exercising more than usual, you might need to change the dose of your medicine. Do not skip meals. Ask your doctor or health care professional if you should avoid alcohol. Many nonprescription cough and cold products contain sugar or alcohol. These can affect blood sugar. Pens should never be shared. Even if the needle is changed, sharing may result in passing of viruses like hepatitis or HIV. Wear a medical ID bracelet or chain, and carry a card that describes your disease and details of your medicine and dosage times. Do not become pregnant while taking this medicine. Women should inform their doctor if they wish to become pregnant or think they might be pregnant. There is a potential  for serious side effects to an unborn child. Talk to your health care professional or pharmacist for more information. What side effects may I notice from receiving this medicine? Side effects that you should report to your doctor or health care professional as soon as possible:  allergic reactions like skin rash, itching or hives, swelling of the face, lips, or tongue  breathing problems  changes in vision  diarrhea that continues or is severe  lump or swelling on the neck  severe nausea  signs and symptoms of infection like fever or chills; cough; sore throat; pain or trouble passing urine  signs and symptoms of low blood sugar such as feeling anxious, confusion, dizziness, increased hunger, unusually weak or tired, sweating, shakiness, cold,  irritable, headache, blurred vision, fast heartbeat, loss of consciousness  signs and symptoms of kidney injury like trouble passing urine or change in the amount of urine  trouble swallowing  unusual stomach upset or pain  vomiting Side effects that usually do not require medical attention (report to your doctor or health care professional if they continue or are bothersome):  constipation  diarrhea  nausea  pain, redness, or irritation at site where injected  stomach upset This list may not describe all possible side effects. Call your doctor for medical advice about side effects. You may report side effects to FDA at 1-800-FDA-1088. Where should I keep my medicine? Keep out of the reach of children. Store unopened pens in a refrigerator between 2 and 8 degrees C (36 and 46 degrees F). Do not freeze. Protect from light and heat. After you first use the pen, it can be stored for 56 days at room temperature between 15 and 30 degrees C (59 and 86 degrees F) or in a refrigerator. Throw away your used pen after 56 days or after the expiration date, whichever comes first. Do not store your pen with the needle attached. If the needle is left on, medicine may leak from the pen. NOTE: This sheet is a summary. It may not cover all possible information. If you have questions about this medicine, talk to your doctor, pharmacist, or health care provider.  2021 Elsevier/Gold Standard (2019-05-02 09:41:51)

## 2020-11-14 ENCOUNTER — Other Ambulatory Visit: Payer: Self-pay

## 2020-11-14 ENCOUNTER — Ambulatory Visit: Payer: Commercial Managed Care - PPO | Admitting: Obstetrics & Gynecology

## 2020-11-14 ENCOUNTER — Encounter: Payer: Self-pay | Admitting: Obstetrics & Gynecology

## 2020-11-14 VITALS — BP 128/86 | Ht 60.0 in | Wt 245.0 lb

## 2020-11-14 DIAGNOSIS — E661 Drug-induced obesity: Secondary | ICD-10-CM

## 2020-11-14 DIAGNOSIS — Z01419 Encounter for gynecological examination (general) (routine) without abnormal findings: Secondary | ICD-10-CM

## 2020-11-14 DIAGNOSIS — Z6841 Body Mass Index (BMI) 40.0 and over, adult: Secondary | ICD-10-CM

## 2020-11-14 DIAGNOSIS — Z90710 Acquired absence of both cervix and uterus: Secondary | ICD-10-CM

## 2020-11-14 DIAGNOSIS — Z780 Asymptomatic menopausal state: Secondary | ICD-10-CM | POA: Diagnosis not present

## 2020-11-14 NOTE — Progress Notes (Signed)
Maureen [name] May 26, 1956 [id number]   History:    65 y.o.  [id number] Married  [id number] presenting for annual gyn exam   HPI:S/P Total Hysterectomy.  Postmenopause, well on no hormone replacement therapy. No pelvic pain. Abstinent. Breast normal. Body mass index 47.85. Planning to loose weight, just started on Ozempic. Health labs with family physician. Colonoscopy March 2020.  Past medical history,surgical history, family history and social history were all reviewed and documented in the EPIC chart.  Gynecologic History No LMP recorded. Patient is postmenopausal.  Obstetric History OB History  Gravida Para Term Preterm AB Living  3       2 1   SAB IAB Ectopic Multiple Live Births  2       1    # Outcome Date GA Lbr Len/2nd Weight Sex Delivery Anes PTL Lv  3 Gravida           2 SAB           1 SAB              ROS: A ROS was performed and pertinent positives and negatives are included in the history.  GENERAL: No fevers or chills. HEENT: No change in vision, no earache, sore throat or sinus congestion. NECK: No pain or stiffness. CARDIOVASCULAR: No chest pain or pressure. No palpitations. PULMONARY: No shortness of breath, cough or wheeze. GASTROINTESTINAL: No abdominal pain, nausea, vomiting or diarrhea, melena or bright red blood per rectum. GENITOURINARY: No urinary frequency, urgency, hesitancy or dysuria. MUSCULOSKELETAL: No joint or muscle pain, no back pain, no recent trauma. DERMATOLOGIC: No rash, no itching, no lesions. ENDOCRINE: No polyuria, polydipsia, no heat or cold intolerance. No recent change in weight. HEMATOLOGICAL: No anemia or easy bruising or bleeding. NEUROLOGIC: No headache, seizures, numbness, tingling or weakness. PSYCHIATRIC: No depression, no loss of interest in normal activity or change in sleep pattern.     Exam:   BP 128/86   Ht 5' (1.524 m)   Wt 245 lb (111.1 kg)   BMI 47.85 kg/m   Body mass index is 47.85  kg/m.  General appearance : Well developed well nourished female. No acute distress HEENT: Eyes: no retinal hemorrhage or exudates,  Neck supple, trachea midline, no carotid bruits, no thyroidmegaly Lungs: Clear to auscultation, no rhonchi or wheezes, or rib retractions  Heart: Regular rate and rhythm, no murmurs or gallops Breast:Examined in sitting and supine position were symmetrical in appearance, no palpable masses or tenderness,  no skin retraction, no nipple inversion, no nipple discharge, no skin discoloration, no axillary or supraclavicular lymphadenopathy Abdomen: no palpable masses or tenderness, no rebound or guarding Extremities: no edema or skin discoloration or tenderness  Pelvic: Vulva: Normal             Vagina: No gross lesions or discharge  Cervix/Uterus absent  Adnexa  Without masses or tenderness  Anus: Normal   Assessment/Plan:  65 y.o. female for annual exam   1. Well female exam with routine gynecological exam Gynecologic exam s/p total hysterectomy in menopause.  No indication for a Pap test.  Breasts normal. Screening mammo neg 12/2019.  [place]ono 2020.  Health labs with Fam MD.    2. S/P laparoscopic assisted vaginal hysterectomy (LAVH)  3. Postmenopausal Well on no HRT.  BD 10/2019 very mild Osteopenia with T-Score -1.2 at the Lt Fem Neck.  Vit D supplements, Ca++ 1.5 g/d total, regular weight bearing activities.  4. 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  Low calorie/carb diet.  Aerobic activities 5 times a week and light weight lifting every 2 days.  [name] MD, 4:33 PM 11/14/2020

## 2020-11-24 ENCOUNTER — Encounter: Payer: Self-pay | Admitting: Obstetrics & Gynecology

## 2020-12-10 ENCOUNTER — Other Ambulatory Visit: Payer: Self-pay | Admitting: Internal Medicine

## 2020-12-31 ENCOUNTER — Other Ambulatory Visit: Payer: Self-pay | Admitting: Adult Health

## 2020-12-31 DIAGNOSIS — R7301 Impaired fasting glucose: Secondary | ICD-10-CM

## 2021-01-14 ENCOUNTER — Other Ambulatory Visit: Payer: Self-pay | Admitting: Internal Medicine

## 2021-01-16 ENCOUNTER — Encounter: Payer: Self-pay | Admitting: Obstetrics & Gynecology

## 2021-01-20 ENCOUNTER — Other Ambulatory Visit: Payer: Self-pay | Admitting: Internal Medicine

## 2021-01-30 ENCOUNTER — Other Ambulatory Visit: Payer: Self-pay | Admitting: Adult Health

## 2021-02-26 DIAGNOSIS — Z8669 Personal history of other diseases of the nervous system and sense organs: Secondary | ICD-10-CM | POA: Insufficient documentation

## 2021-02-26 DIAGNOSIS — J45909 Unspecified asthma, uncomplicated: Secondary | ICD-10-CM | POA: Insufficient documentation

## 2021-02-26 NOTE — Progress Notes (Signed)
3 MONTH FOLLOWUP  Assessment and Plan:    Maureen [name] was seen today for 3 month followup  Diagnoses and all orders for this visit:  Morbid obesity ([place]) Impaired fasting glucose S/p lap band and regained weight Discussed semaglutide may be beneficial for significant weigth loss to avoid repeat surgical intervention; discussed risks/benefits at length and wants to proceed First dose given in office today with 6 weeks supply If tolerating 0.5 mg dose well for 8 weeks increase to 1 mg daily  Follow up 3 months or sooner if any concerns -    Semaglutide 2 MG/1.5ML SOPN; Inject 0.5 mg into the skin once a week. -A1C  HYPERTENSION Continue current medications:HCTZ 12.5mg  daily in am, zestril 40mg   Monitor blood pressure at home; call if consistently over 130/80 Continue DASH diet.   Reminder to go to the ER if any CP, SOB, nausea, dizziness, severe HA, changes vision/speech, left arm numbness and tingling and jaw pain.  -CBC - CMP   LAP-BAND surgery status F/u Dr. Hassell Done because fluid was taken out of lap band because was questioning GERD but started on Flonase and stomach issues have resolved Was feeling fatigued and started taking B12 1000 2 pills daily, adv. Pt may do better with dissolvable B12, will check b12 today   HYPERLIPIDEMIA -continue medications, check lipids, decrease fatty foods, increase activity. -Check lipid panel  Non-seasonal allergic rhinitis, unspecified trigger Allergy hygiene explained - check irritants in room, air purifier, humidifier, avoid vents on face Continue daily antihistamine; try saline irrigation then flonase 1-2 sprays in each nostril at HS Follow up if not improving;  -     fluticasone (FLONASE) 50 MCG/ACT nasal spray; Place 1-2 sprays into both nostrils at bedtime. This has helped her symptoms dramatically!   Further disposition pending results of labs. Discussed med's effects and SE's.   Over 30 minutes of exam, counseling, chart review, and  critical decision making was performed.   Future Appointments  Date Time Provider [place]  08/11/2021  2:00 PM Garnet Sierras, NP [facility] None  11/18/2021  2:00 PM [name], MD [facility] None    ------------------------------------------------------------------------------------------------------------------   HPI BP 126/76 (BP Location: Right Arm, Patient Position: Sitting, Cuff Size: Large)   Pulse 74   Temp (!) 97.5 F (36.4 C)   Resp 17   Ht 5' (1.524 m)   Wt 238 lb 3.2 oz (108 kg)   SpO2 96%   BMI 46.52 kg/m   Maureen [name] with morbid obesity, htn, allergic rhinitis presents for 3 month follow up   She does have hx of OSA that had resolved with weight loss, continues to treat per patient with oral device from dentist - has upcoming follow up.  BMI is Body mass index is 46.52 kg/m., Has been working on diet and exercise, just retired and trying to be more active. Discussed semaglutide as new option; she is very interested in trying this prior to follow up with Dr. Melford Aase.  Wt Readings from Last 3 Encounters:  02/27/21 238 lb 3.2 oz (108 kg)  11/14/20 245 lb (111.1 kg)  11/05/20 249 lb (112.9 kg)   .    Past Medical History:  Diagnosis Date   Arthritis    LEFT KNEE, LOWER BACK   GERD (gastroesophageal reflux disease)    History of hiatal hernia    History of obstructive sleep apnea    per pt history osa w/ cpap until wt loss after gastric band, retested negative for sleep apnea  Hypertension    IBS (irritable bowel syndrome)    Numbness    BOTTOM LEFT FOOT   PONV (postoperative nausea and vomiting)    PONV with gallbladder    Seasonal allergies    Sinus drainage    Snores    WEARS NIGHT GUARD   SUI (stress urinary incontinence, female)      Allergies  Allergen Reactions   Mucinex [Guaifenesin Er] Other (See Comments)    "feels weird and dizzy"   Tramadol Itching    Current Outpatient Medications on File Prior to Visit   Medication Sig   Cholecalciferol (VITAMIN D-3) 5000 units TABS Take 5,000 Units by mouth daily.   ELDERBERRY PO Take by mouth.   fluticasone (FLONASE) 50 MCG/ACT nasal spray PLACE 1-2 SPRAYS INTO BOTH NOSTRILS AT BEDTIME.   hydrochlorothiazide (HYDRODIURIL) 12.5 MG tablet TAKE 1 TABLET BY MOUTH DAILY FOR BLOOD PRESSURE (HIGH) AND FLUID   lisinopril (ZESTRIL) 40 MG tablet TAKE ONE TABLET BY MOUTH EVERY DAY FOR BLOOD PRESSURE   loratadine (CLARITIN) 10 MG tablet Take 10 mg by mouth every morning.    Multiple Vitamin (MULTIVITAMIN) capsule Take 1 capsule by mouth daily.    omeprazole (PRILOSEC) 20 MG capsule Take  1 capsule  Daily  to Prevent Heartburn &  Indigestion   Semaglutide,0.25 or 0.5MG /DOS, (OZEMPIC, 0.25 OR 0.5 MG/DOSE,) 2 MG/1.5ML SOPN Inject  1.0 mg (0.75 ml) into the skin  Once / Week   venlafaxine XR (EFFEXOR-XR) 75 MG 24 hr capsule Take     1 capsule     Daily      for Mood   Zinc 15 MG CAPS Take by mouth.   No current facility-administered medications on file prior to visit.   Review of Systems  Constitutional:  Positive for weight loss (down 7 pounds using ozempic). Negative for chills and malaise/fatigue.  HENT:  Negative for congestion, ear discharge, hearing loss, sore throat and tinnitus.   Eyes:  Negative for blurred vision, discharge and redness.  Respiratory:  Negative for cough, shortness of breath and wheezing.   Cardiovascular:  Negative for chest pain, palpitations, orthopnea and leg swelling.  Gastrointestinal:  Negative for abdominal pain, blood in stool, constipation, diarrhea, heartburn, nausea and vomiting.  Genitourinary:  Negative for dysuria, frequency and urgency.  Musculoskeletal:  Positive for joint pain (hip pain bilaterally, TENS relieves). Negative for back pain, falls and myalgias.  Skin:  Negative for rash.  Neurological:  Negative for dizziness, tingling, seizures, weakness and headaches.  Endo/Heme/Allergies:  Does not bruise/bleed easily.   Psychiatric/Behavioral:  Negative for depression, hallucinations, memory loss and suicidal ideas. The patient does not have insomnia.     Physical Exam:  BP 126/76 (BP Location: Right Arm, Patient Position: Sitting, Cuff Size: Large)   Pulse 74   Temp (!) 97.5 F (36.4 C)   Resp 17   Ht 5' (1.524 m)   Wt 238 lb 3.2 oz (108 kg)   SpO2 96%   BMI 46.52 kg/m   General Appearance: Well nourished, in no apparent distress. Eyes: PERRLA, EOMs, conjunctiva no swelling or erythema Sinuses: No Frontal/maxillary tenderness ENT/Mouth: Ext aud canals clear, TMs without erythema, bulging. No erythema, swelling, or exudate on post pharynx.  Tonsils not swollen or erythematous. Large tongue/small oral cavity. Hearing normal.  Neck: Supple, thyroid normal.  Respiratory: Respiratory effort normal, BS equal bilaterally without rales, rhonchi, wheezing or stridor.  Cardio: RRR with no MRGs. Brisk peripheral pulses without edema.  Abdomen: Soft, central obesity, +  BS.  Mild epigastric tenderness, no guarding, rebound, palpable hernias, masses. Lymphatics: Non tender without lymphadenopathy.  Musculoskeletal: no obvious deformity, normal gait.  Skin: Warm, dry without rashes, lesions, ecchymosis.  Neuro: Normal muscle tone, no cerebellar symptoms. Sensation intact.  Psych: Awake and oriented X 3, normal affect, Insight and Judgment appropriate.     Magda Bernheim, NP 3:46 PM [facility]

## 2021-02-27 ENCOUNTER — Ambulatory Visit: Payer: Commercial Managed Care - PPO | Admitting: Adult Health

## 2021-02-27 ENCOUNTER — Other Ambulatory Visit: Payer: Self-pay

## 2021-02-27 ENCOUNTER — Ambulatory Visit: Payer: Commercial Managed Care - PPO | Admitting: Nurse Practitioner

## 2021-02-27 VITALS — BP 126/76 | HR 74 | Temp 97.5°F | Resp 17 | Ht 60.0 in | Wt 238.2 lb

## 2021-02-27 DIAGNOSIS — K589 Irritable bowel syndrome without diarrhea: Secondary | ICD-10-CM

## 2021-02-27 DIAGNOSIS — K2100 Gastro-esophageal reflux disease with esophagitis, without bleeding: Secondary | ICD-10-CM

## 2021-02-27 DIAGNOSIS — Z79899 Other long term (current) drug therapy: Secondary | ICD-10-CM | POA: Diagnosis not present

## 2021-02-27 DIAGNOSIS — I10 Essential (primary) hypertension: Secondary | ICD-10-CM

## 2021-02-27 DIAGNOSIS — E559 Vitamin D deficiency, unspecified: Secondary | ICD-10-CM | POA: Diagnosis not present

## 2021-02-27 DIAGNOSIS — E785 Hyperlipidemia, unspecified: Secondary | ICD-10-CM

## 2021-02-27 DIAGNOSIS — M1711 Unilateral primary osteoarthritis, right knee: Secondary | ICD-10-CM

## 2021-02-27 DIAGNOSIS — Z1329 Encounter for screening for other suspected endocrine disorder: Secondary | ICD-10-CM

## 2021-02-27 NOTE — Patient Instructions (Signed)
Vitamin B12 Gummies or Soft Chews What is this medication? VITAMIN B12 (VAHY tuh min B12) prevents and treats low vitamin B12 levels in your body. It is used in people who do not get enough vitamin B12 from their diet or when their digestive tract does not absorb enough. Vitamin B12 plays an important role in maintaining the health of your nervous system and red bloodcells. This medicine may be used for other purposes; ask your health care provider orpharmacist if you have questions. What should I tell my care team before I take this medication? They need to know if you have any of these conditions: Leber's disease Low amounts of iron or folic acid in your blood Megaloblastic anemia An unusual or allergic reaction to vitamin B12, cobalt, other medications, foods, dyes, or preservatives Pregnant or trying to get pregnant Breast-feeding How should I use this medication? Take this medication by mouth. Take it as directed on the label. You can take it with or without food. If it upsets your stomach, take it with food. Do notuse it more often than directed. Talk to your care team about the use of this medication in children. While itmay be given to children for selected conditions, precautions do apply. Overdosage: If you think you have taken too much of this medicine contact apoison control center or emergency room at once. NOTE: This medicine is only for you. Do not share this medicine with others. What if I miss a dose? If you miss a dose, take it as soon as you can. If it is almost time for yournext dose, take only that dose. Do not take double or extra doses. What may interact with this medication? Colchicine Heavy alcohol intake This list may not describe all possible interactions. Give your health care provider a list of all the medicines, herbs, non-prescription drugs, or dietary supplements you use. Also tell them if you smoke, drink alcohol, or use illegaldrugs. Some items may interact  with your medicine. What should I watch for while using this medication? Visit your care team for regular checks on your progress. Tell your care teamif your symptoms do not start to get better or if they get worse. What side effects may I notice from receiving this medication? Side effects that you should report to your care team as soon as possible: Allergic reactions-skin rash, itching, hives, swelling of the face, lips, tongue, or throat Side effects that usually do not require medical attention (report these toyour care team if they continue or are bothersome): Diarrhea Fatigue Headache Nausea This list may not describe all possible side effects. Call your doctor for medical advice about side effects. You may report side effects to FDA at1-800-FDA-1088. Where should I keep my medication? Keep out of the reach of children and pets. Store at room temperature between 20 and 25 degrees C (68 and 77 degrees F).Get rid of any unused medication after the expiration date. To get rid of medications that are no longer needed or have expired: Take the medication to a medication take-back program. Check with your pharmacy or law enforcement to find a location. If you cannot return the medication, check the label or package insert to see if the medication should be thrown out in the garbage or flushed down the toilet. If you are not sure, ask your care team. If it is safe to put it in the trash, empty the medication out of the container. Mix the medication with cat litter, dirt, coffee grounds, or other unwanted  substance. Seal the mixture in a bag or container. Put it in the trash. NOTE: This sheet is a summary. It may not cover all possible information. If you have questions about this medicine, talk to your doctor, pharmacist, orhealth care provider.  2022 Elsevier/Gold Standard (2020-10-29 12:10:15)

## 2021-02-28 LAB — COMPLETE METABOLIC PANEL WITH GFR
AG Ratio: 1.6 (calc) (ref 1.0–2.5)
ALT: 29 U/L (ref 6–29)
AST: 23 U/L (ref 10–35)
Albumin: 4.1 g/dL (ref 3.6–5.1)
Alkaline phosphatase (APISO): 92 U/L (ref 37–153)
BUN: 16 mg/dL (ref 7–25)
CO2: 30 mmol/L (ref 20–32)
Calcium: 10.2 mg/dL (ref 8.6–10.4)
Chloride: 100 mmol/L (ref 98–110)
Creat: 0.66 mg/dL (ref 0.50–0.99)
GFR, Est African American: 107 mL/min/{1.73_m2} (ref >=60)
GFR, Est Non African American: 92 mL/min/{1.73_m2} (ref >=60)
Globulin: 2.6 g/dL (calc) (ref 1.9–3.7)
Glucose, Bld: 82 mg/dL (ref 65–99)
Potassium: 4.3 mmol/L (ref 3.5–5.3)
Sodium: 139 mmol/L (ref 135–146)
Total Bilirubin: 0.5 mg/dL (ref 0.2–1.2)
Total Protein: 6.7 g/dL (ref 6.1–8.1)

## 2021-02-28 LAB — LIPID PANEL
Cholesterol: 184 mg/dL (ref <200)
HDL: 66 mg/dL (ref >=50)
LDL Cholesterol (Calc): 93 mg/dL (calc)
Non-HDL Cholesterol (Calc): 118 mg/dL (calc) (ref <130)
Total CHOL/HDL Ratio: 2.8 (calc) (ref <5.0)
Triglycerides: 155 mg/dL — ABNORMAL HIGH (ref <150)

## 2021-02-28 LAB — CBC WITH DIFFERENTIAL/PLATELET
Absolute Monocytes: 469 cells/uL (ref 200–950)
Basophils Absolute: 27 cells/uL (ref 0–200)
Basophils Relative: 0.4 %
Eosinophils Absolute: 107 cells/uL (ref 15–500)
Eosinophils Relative: 1.6 %
HCT: 41.8 % (ref 35.0–45.0)
Hemoglobin: 14 g/dL (ref 11.7–15.5)
Lymphs Abs: 2077 cells/uL (ref 850–3900)
MCH: 32.5 pg (ref 27.0–33.0)
MCHC: 33.5 g/dL (ref 32.0–36.0)
MCV: 97 fL (ref 80.0–100.0)
MPV: 10.7 fL (ref 7.5–12.5)
Monocytes Relative: 7 %
Neutro Abs: 4020 cells/uL (ref 1500–7800)
Neutrophils Relative %: 60 %
Platelets: 257 10*3/uL (ref 140–400)
RBC: 4.31 10*6/uL (ref 3.80–5.10)
RDW: 12.7 % (ref 11.0–15.0)
Total Lymphocyte: 31 %
WBC: 6.7 10*3/uL (ref 3.8–10.8)

## 2021-02-28 LAB — VITAMIN B12: Vitamin B-12: >2000 pg/mL — ABNORMAL HIGH (ref 200–1100)

## 2021-02-28 LAB — HEMOGLOBIN A1C
Hgb A1c MFr Bld: 5.4 % of total Hgb (ref <5.7)
Mean Plasma Glucose: 108 mg/dL
eAG (mmol/L): 6 mmol/L

## 2021-02-28 LAB — VITAMIN D 25 HYDROXY (VIT D DEFICIENCY, FRACTURES): Vit D, 25-Hydroxy: 85 ng/mL (ref 30–100)

## 2021-02-28 LAB — TSH: TSH: 2.35 mIU/L (ref 0.40–4.50)

## 2021-03-18 ENCOUNTER — Encounter: Payer: Self-pay | Admitting: Nurse Practitioner

## 2021-03-24 ENCOUNTER — Other Ambulatory Visit: Payer: Self-pay | Admitting: Internal Medicine

## 2021-04-15 ENCOUNTER — Other Ambulatory Visit: Payer: Self-pay

## 2021-04-15 MED ORDER — HYDROCHLOROTHIAZIDE 12.5 MG PO TABS: ORAL_TABLET | ORAL | 0 refills | Status: DC

## 2021-04-15 MED ORDER — VENLAFAXINE HCL ER 75 MG PO CP24: ORAL_CAPSULE | ORAL | 0 refills | Status: DC

## 2021-06-09 NOTE — Progress Notes (Signed)
3 MONTH FOLLOWUP  Assessment and Plan: Maureen [name] was seen today for follow-up.  Diagnoses and all orders for this visit:  Essential hypertension -     CBC with Differential/Platelet -  continue medications, DASH diet, exercise and monitor at home. Call if greater than 130/80.    Hyperlipidemia, unspecified hyperlipidemia type -     COMPLETE METABOLIC PANEL WITH GFR -     Lipid panel - Continue low cholesterol diet and exercise  Morbid obesity (HCC)  S/P lap band which was able to be refilled with 1/2 cc without side effects  Continue Ozempic 1mg  SQ QW  Continue diet and exercise  LAP-BAND surgery status  Dr. Hassell Done continues to follow  Medication management  Continued  Abnormal glucose  Continue Ozempic, diet and exercise  Screening for hematuria or proteinuria -     Urinalysis, Routine w reflex microscopic -     Microalbumin / creatinine urine ratio     Further disposition pending results of labs. Discussed med's effects and SE's.   Over 30 minutes of exam, counseling, chart review, and critical decision making was performed.   Future Appointments  Date Time Provider [place]  08/11/2021  2:00 PM Magda Bernheim, NP [facility] None  11/18/2021  2:00 PM [name], MD [facility] None    ------------------------------------------------------------------------------------------------------------------   HPI BP 120/80   Pulse 86   Temp 97.6 F (36.4 C)   Wt 229 lb 12.8 oz (104.2 kg)   SpO2 96%   BMI 44.88 kg/m   65 y.o.female with morbid obesity, htn, hyperlipidemia, abnormal glucose presents for 3 month follow up  She does have hx of OSA that had resolved with weight loss, continues to treat per patient with oral device from dentist - has upcoming follow up.  BMI is Body mass index is 44.88 kg/m., Has been working on diet and exercise, just retired and trying to be more active. Discussed semaglutide as new option; she is very interested in trying this  prior to follow up with Dr. Melford Aase.  Wt Readings from Last 3 Encounters:  06/10/21 229 lb 12.8 oz (104.2 kg)  02/27/21 238 lb 3.2 oz (108 kg)  11/14/20 245 lb (111.1 kg)   Dr. Hassell Done did put 1/2 cc back into band and has no problems.   Past Medical History:  Diagnosis Date   Arthritis    LEFT KNEE, LOWER BACK   GERD (gastroesophageal reflux disease)    History of hiatal hernia    History of obstructive sleep apnea    per pt history osa w/ cpap until wt loss after gastric band, retested negative for sleep apnea   Hypertension    IBS (irritable bowel syndrome)    Numbness    BOTTOM LEFT FOOT   PONV (postoperative nausea and vomiting)    PONV with gallbladder    Seasonal allergies    Sinus drainage    Snores    WEARS NIGHT GUARD   SUI (stress urinary incontinence, female)      Allergies  Allergen Reactions   Mucinex [Guaifenesin Er] Other (See Comments)    "feels weird and dizzy"   Tramadol Itching    Current Outpatient Medications on File Prior to Visit  Medication Sig   Cholecalciferol (VITAMIN D-3) 5000 units TABS Take 5,000 Units by mouth daily.   ELDERBERRY PO Take by mouth.   fluticasone (FLONASE) 50 MCG/ACT nasal spray PLACE 1-2 SPRAYS INTO BOTH NOSTRILS AT BEDTIME.   hydrochlorothiazide (HYDRODIURIL) 12.5 MG tablet TAKE 1  TABLET BY MOUTH DAILY FOR BLOOD PRESSURE (HIGH) AND FLUID   lisinopril (ZESTRIL) 40 MG tablet TAKE ONE TABLET BY MOUTH EVERY DAY FOR BLOOD PRESSURE   loratadine (CLARITIN) 10 MG tablet Take 10 mg by mouth every morning.    Multiple Vitamin (MULTIVITAMIN) capsule Take 1 capsule by mouth daily.    omeprazole (PRILOSEC) 20 MG capsule Take  1 capsule  Daily  to Prevent Heartburn &  Indigestion   OZEMPIC, 1 MG/DOSE, 4 MG/3ML SOPN INJECT 0.75 MLS (1MG )INTO THE SKIN ONCE WEEKLY.   venlafaxine XR (EFFEXOR-XR) 75 MG 24 hr capsule Take one capsule daily for mood.   Zinc 15 MG CAPS Take by mouth.   Semaglutide,0.25 or 0.5MG /DOS, (OZEMPIC, 0.25 OR 0.5  MG/DOSE,) 2 MG/1.5ML SOPN Inject  1.0 mg (0.75 ml) into the skin  Once / Week (Patient not taking: Reported on 06/10/2021)   No current facility-administered medications on file prior to visit.   Review of Systems  Constitutional:  Negative for chills, malaise/fatigue and weight loss.  HENT:  Negative for congestion, ear discharge, hearing loss, sore throat and tinnitus.   Eyes:  Negative for blurred vision, discharge and redness.  Respiratory:  Negative for cough, shortness of breath and wheezing.   Cardiovascular:  Negative for chest pain, palpitations, orthopnea and leg swelling.  Gastrointestinal:  Negative for abdominal pain, blood in stool, constipation, diarrhea, heartburn, nausea and vomiting.  Genitourinary:  Negative for dysuria, frequency and urgency.  Musculoskeletal:  Positive for joint pain (hip pain bilaterally, TENS relieves). Negative for back pain, falls and myalgias.  Skin:  Negative for rash.  Neurological:  Negative for dizziness, tingling, seizures, weakness and headaches.  Endo/Heme/Allergies:  Does not bruise/bleed easily.  Psychiatric/Behavioral:  Negative for depression, hallucinations, memory loss and suicidal ideas. The patient does not have insomnia.     Physical Exam:  BP 120/80   Pulse 86   Temp 97.6 F (36.4 C)   Wt 229 lb 12.8 oz (104.2 kg)   SpO2 96%   BMI 44.88 kg/m   General Appearance: Well nourished, in no apparent distress. Eyes: PERRLA, EOMs, conjunctiva no swelling or erythema Sinuses: No Frontal/maxillary tenderness ENT/Mouth: Ext aud canals clear, TMs without erythema, bulging. No erythema, swelling, or exudate on post pharynx.  Tonsils not swollen or erythematous. Large tongue/small oral cavity. Hearing normal.  Neck: Supple, thyroid normal.  Respiratory: Respiratory effort normal, BS equal bilaterally without rales, rhonchi, wheezing or stridor.  Cardio: RRR with no MRGs. Brisk peripheral pulses without edema.  Abdomen: Soft, central  obesity, + BS.  Mild epigastric tenderness, no guarding, rebound, palpable hernias, masses. Lymphatics: Non tender without lymphadenopathy.  Musculoskeletal: no obvious deformity, normal gait.  Skin: Warm, dry without rashes, lesions, ecchymosis.  Neuro: Normal muscle tone, no cerebellar symptoms. Sensation intact.  Psych: Awake and oriented X 3, normal affect, Insight and Judgment appropriate.     Magda Bernheim, NP 10:41 AM [facility]

## 2021-06-10 ENCOUNTER — Ambulatory Visit: Payer: Commercial Managed Care - PPO | Admitting: Nurse Practitioner

## 2021-06-10 ENCOUNTER — Encounter: Payer: Self-pay | Admitting: Nurse Practitioner

## 2021-06-10 ENCOUNTER — Other Ambulatory Visit: Payer: Self-pay

## 2021-06-10 VITALS — BP 120/80 | HR 86 | Temp 97.6°F | Wt 229.8 lb

## 2021-06-10 DIAGNOSIS — Z1389 Encounter for screening for other disorder: Secondary | ICD-10-CM

## 2021-06-10 DIAGNOSIS — Z9884 Bariatric surgery status: Secondary | ICD-10-CM

## 2021-06-10 DIAGNOSIS — E785 Hyperlipidemia, unspecified: Secondary | ICD-10-CM

## 2021-06-10 DIAGNOSIS — Z79899 Other long term (current) drug therapy: Secondary | ICD-10-CM

## 2021-06-10 DIAGNOSIS — I10 Essential (primary) hypertension: Secondary | ICD-10-CM

## 2021-06-10 DIAGNOSIS — R7309 Other abnormal glucose: Secondary | ICD-10-CM

## 2021-06-10 NOTE — Patient Instructions (Signed)

## 2021-06-11 LAB — LIPID PANEL
Cholesterol: 195 mg/dL (ref <200)
HDL: 67 mg/dL (ref >=50)
LDL Cholesterol (Calc): 100 mg/dL (calc) — ABNORMAL HIGH
Non-HDL Cholesterol (Calc): 128 mg/dL (calc) (ref <130)
Total CHOL/HDL Ratio: 2.9 (calc) (ref <5.0)
Triglycerides: 189 mg/dL — ABNORMAL HIGH (ref <150)

## 2021-06-11 LAB — CBC WITH DIFFERENTIAL/PLATELET
Absolute Monocytes: 325 cells/uL (ref 200–950)
Basophils Absolute: 20 cells/uL (ref 0–200)
Basophils Relative: 0.4 %
Eosinophils Absolute: 140 cells/uL (ref 15–500)
Eosinophils Relative: 2.8 %
HCT: 42.9 % (ref 35.0–45.0)
Hemoglobin: 14.6 g/dL (ref 11.7–15.5)
Lymphs Abs: 1625 cells/uL (ref 850–3900)
MCH: 33.2 pg — ABNORMAL HIGH (ref 27.0–33.0)
MCHC: 34 g/dL (ref 32.0–36.0)
MCV: 97.5 fL (ref 80.0–100.0)
MPV: 10.1 fL (ref 7.5–12.5)
Monocytes Relative: 6.5 %
Neutro Abs: 2890 cells/uL (ref 1500–7800)
Neutrophils Relative %: 57.8 %
Platelets: 289 10*3/uL (ref 140–400)
RBC: 4.4 10*6/uL (ref 3.80–5.10)
RDW: 12 % (ref 11.0–15.0)
Total Lymphocyte: 32.5 %
WBC: 5 10*3/uL (ref 3.8–10.8)

## 2021-06-11 LAB — COMPLETE METABOLIC PANEL WITH GFR
AG Ratio: 1.8 (calc) (ref 1.0–2.5)
ALT: 28 U/L (ref 6–29)
AST: 22 U/L (ref 10–35)
Albumin: 4.2 g/dL (ref 3.6–5.1)
Alkaline phosphatase (APISO): 93 U/L (ref 37–153)
BUN: 13 mg/dL (ref 7–25)
CO2: 30 mmol/L (ref 20–32)
Calcium: 9.7 mg/dL (ref 8.6–10.4)
Chloride: 101 mmol/L (ref 98–110)
Creat: 0.66 mg/dL (ref 0.50–1.05)
Globulin: 2.4 g/dL (calc) (ref 1.9–3.7)
Glucose, Bld: 88 mg/dL (ref 65–99)
Potassium: 4.2 mmol/L (ref 3.5–5.3)
Sodium: 139 mmol/L (ref 135–146)
Total Bilirubin: 0.5 mg/dL (ref 0.2–1.2)
Total Protein: 6.6 g/dL (ref 6.1–8.1)
eGFR: 97 mL/min/{1.73_m2} (ref >=60)

## 2021-06-11 LAB — MICROALBUMIN / CREATININE URINE RATIO
Creatinine, Urine: 96 mg/dL (ref 20–275)
Microalb Creat Ratio: 5 mcg/mg creat (ref <30)
Microalb, Ur: 0.5 mg/dL

## 2021-06-11 LAB — URINALYSIS, ROUTINE W REFLEX MICROSCOPIC
Bilirubin Urine: NEGATIVE
Glucose, UA: NEGATIVE
Hgb urine dipstick: NEGATIVE
Ketones, ur: NEGATIVE
Leukocytes,Ua: NEGATIVE
Nitrite: NEGATIVE
Protein, ur: NEGATIVE
Specific Gravity, Urine: 1.019 (ref 1.001–1.035)
pH: 6 (ref 5.0–8.0)

## 2021-06-19 ENCOUNTER — Other Ambulatory Visit: Payer: Self-pay | Admitting: Internal Medicine

## 2021-07-21 ENCOUNTER — Other Ambulatory Visit: Payer: Self-pay | Admitting: Adult Health

## 2021-08-04 ENCOUNTER — Telehealth: Payer: Self-pay

## 2021-08-04 ENCOUNTER — Other Ambulatory Visit: Payer: Self-pay | Admitting: Nurse Practitioner

## 2021-08-04 DIAGNOSIS — J40 Bronchitis, not specified as acute or chronic: Secondary | ICD-10-CM

## 2021-08-04 MED ORDER — AZITHROMYCIN 250 MG PO TABS: ORAL_TABLET | ORAL | 1 refills | Status: DC

## 2021-08-04 NOTE — Telephone Encounter (Signed)
I sent in Zithromax to take with Amoxicillin.  If no improvement in next 3 days return to Urgent Care, may need chest Xray

## 2021-08-04 NOTE — Telephone Encounter (Signed)
Patient went to [facility] 07/29/21. She was diagnosed with a sinus infection and given prednisone and amoxicillin. She says it feels like it is going into her chest now but is out of town. She has finished the prednisone and has 2 days left of the amoxicillin. Patient said she is still having drainage (yellow, green, and brownish), coughing up phlegm and has started back oh Day and Nyquil.     [facility] [address] [phone]

## 2021-08-11 ENCOUNTER — Encounter: Payer: Commercial Managed Care - PPO | Admitting: Nurse Practitioner

## 2021-08-11 ENCOUNTER — Encounter: Payer: Managed Care, Other (non HMO) | Admitting: Adult Health Nurse Practitioner

## 2021-09-09 NOTE — Progress Notes (Signed)
Medicare Annual Wellness visit    Assessment / Plan:    Encounter for medicare annual wellness visit Yearly  Essential hypertension Continue current medications:HCTZ 12.5mg  daily in am, zestril 40mg   Monitor blood pressure at home; call if consistently over 130/80 Continue DASH diet.   Reminder to go to the ER if any CP, SOB, nausea, dizziness, severe HA, changes vision/speech, left arm numbness and tingling and jaw pain. - Check CBC   Abnormal glucose Continue Ozempic Discussed general issues about diabetes pathophysiology and management., Educational material distributed., Suggested low cholesterol diet., Encouraged aerobic exercise., Discussed foot care., Reminded to get yearly retinal exam. A1c   Hyperlipidemia -continue medications, check lipids, decrease fatty foods, increase activity. - Check lipid panel - Check CMP   Morbid obesity - follow up 3 months for progress monitoring - increase veggies, decrease carbs - long discussion about weight loss, diet, and exercise - CONTINUE WEIGHT LOSS, WEIGHT LOSS GOAL IS 180   LAP-BAND surgery status Follow up Dr. Hassell Done   Asthma, unspecified asthma severity, uncomplicated Asthma controlled   IBS (irritable bowel syndrome) Diet controlled   Gastroesophageal reflux disease with esophagitis Continue PPI/H2 blocker, diet discussed  Osteoarthritis, unspecified osteoarthritis type, unspecified site Managing well at this time  Non-seasonal allergic rhinitis, unspecified trigger Managing well at this time Uses Clairatin OTC PRN  Vitamin D deficiency Continue supplementation to maintain goal of 70-100 Taking Vitamin D 5,000 IU daily Defer vitamin D level  Former smoker Monitor  Medication management Continued   Abnormal glucose Discussed dietary and exercise modifications  Menopausal vasomotor symptoms Taking Effexor 75mg    Discussed med's effects and SE's. Screening labs and tests as requested with regular  follow-up as recommended.  Future Appointments  Date Time Provider [place]  11/18/2021  2:00 PM [name], MD [facility] None  09/14/2022 11:00 AM Magda Bernheim, NP [facility] None    Further disposition pending results if labs check today. Discussed med's effects and SE's.   Over 40 minutes of face to face interview, exam, counseling, chart review, and critical decision making was performed.   Plan:   During the course of the visit the patient was educated and counseled about appropriate screening and preventive services including:   Pneumococcal vaccine  Prevnar 13 Influenza vaccine Td vaccine Screening electrocardiogram Bone densitometry screening Colorectal cancer screening Diabetes screening Glaucoma screening Nutrition counseling  Advanced directives: requested   HPI 66 y.o. female  presents for a complete physical and follow up on HTN, HLD Weight, viamin D defciency and abnormal glucose.  Pt has retired and no longer needs to go to [employer] for labs.  No health or medication concerns today.    BMI is Body mass index is 44.61 kg/m., she is working on diet and exercise. She is She states she is walking better and doing better.  Wt Readings from Last 3 Encounters:  09/11/21 228 lb 6.4 oz (103.6 kg)  06/10/21 229 lb 12.8 oz (104.2 kg)  02/27/21 238 lb 3.2 oz (108 kg)   Her blood pressure has been controlled at home, runs 120's at home, on lisinopril 40 and HCTZ 12.5mg  today their BP is BP: 112/82.  BP Readings from Last 3 Encounters:  09/11/21 112/82  06/10/21 120/80  02/27/21 126/76   She had her She denies chest pain, shortness of breath, dizziness.   Her cholesterol is diet controlled. Her cholesterol is controlled. She is NOT fasting for labs today. The cholesterol last visit was:  Lab Results  Component Value  Date   CHOL 195 06/10/2021   HDL 67 06/10/2021   LDLCALC 100 (H) 06/10/2021   TRIG 189 (H) 06/10/2021   CHOLHDL 2.9 06/10/2021   She  has been working on diet and exercise for prediabetes, and denies blurry vision, polydipsia, polyphagia and polyuria. Last A1C in the office was: Lab Results  Component Value Date   HGBA1C 5.4 02/27/2021  Patient is on Vitamin D supplements.  Lab Results  Component Value Date   VD25OH 72 02/27/2021   She is on prilosec for GERD. She is on effexor for IBS/diarrhea, she feels that it is not helping but would like to stay on it to prevent hot flashes.    She has lower back pain with sciatica left leg occ, managing well at this time. Continues to have weakness in left knee, has been replaced twice.  Has done therapy and still weak.  Current Medications:  Current Outpatient Medications on File Prior to Visit  Medication Sig Dispense Refill   Cholecalciferol (VITAMIN D-3) 5000 units TABS Take 5,000 Units by mouth daily.     ELDERBERRY PO Take by mouth.     fluticasone (FLONASE) 50 MCG/ACT nasal spray PLACE 1-2 SPRAYS INTO BOTH NOSTRILS AT BEDTIME. 48 g 3   hydrochlorothiazide (HYDRODIURIL) 12.5 MG tablet TAKE 1 TABLET BY MOUTH EVERY DAY FOR BLOOD PRESSURE AND FLUID 90 tablet 0   lisinopril (ZESTRIL) 40 MG tablet TAKE ONE TABLET BY MOUTH EVERY DAY FOR BLOOD PRESSURE 90 tablet 3   loratadine (CLARITIN) 10 MG tablet Take 10 mg by mouth every morning.      Multiple Vitamin (MULTIVITAMIN) capsule Take 1 capsule by mouth daily.      omeprazole (PRILOSEC) 20 MG capsule Take  1 capsule  Daily  to Prevent Heartburn &  Indigestion 90 capsule 3   OZEMPIC, 1 MG/DOSE, 4 MG/3ML SOPN INJECT 0.75 MLS (1MG )INTO THE SKIN ONCE WEEKLY. 9 mL 1   venlafaxine XR (EFFEXOR-XR) 75 MG 24 hr capsule TAKE 1 CAPSULE BY MOUTH ONCE DAILY FOR MOOD 90 capsule 0   Zinc 15 MG CAPS Take by mouth.     azithromycin (ZITHROMAX) 250 MG tablet Take 2 tablets (500 mg) on  Day 1,  followed by 1 tablet (250 mg) once daily on Days 2 through 5. (Patient not taking: Reported on 09/11/2021) 6 each 1   No current facility-administered  medications on file prior to visit.   Health Maintenance:  Immunization History  Administered Date(s) Administered   Fluad Quad(high Dose 65+) 05/08/2020   Influenza Inj Mdck Quad With Preservative 06/27/2018   Influenza,inj,Quad PF,6+ Mos 06/06/2017, 05/19/2019   Influenza-Unspecified 07/11/2013, 06/23/2017, 06/30/2021   PFIZER(Purple Top)SARS-COV-2 Vaccination 11/01/2019, 11/22/2019, 06/29/2020   Td 04/28/2006   Tdap 12/04/2015   Tetanus: 2017 Pneumovax: 2021 Prevnar 13: due at 40 Flu vaccine: 06/30/21 Zostavax: discussed  Pap: Feb 2020, has OV next year MGM: 12/1920 negative repeat 1 year DEXA:11/21/19 osteopenia Colonoscopy: November 04 2018 (Dr. Collene Mares) due 2030 EGD: N/A Myoview stress test 10/2001 AB [place] 2003  DEE: 2022 Dr. Glennon Mac Dentist: Dr. Carman Ch[place] in [place], q 6 months, 2022 Derm Dr. Nicole Kindred, [place]  Medical History:  Past Medical History:  Diagnosis Date   Arthritis    LEFT KNEE, LOWER BACK   GERD (gastroesophageal reflux disease)    History of hiatal hernia    History of obstructive sleep apnea    per pt history osa w/ cpap until wt loss after gastric band, retested negative for sleep apnea  Hypertension    IBS (irritable bowel syndrome)    Numbness    BOTTOM LEFT FOOT   PONV (postoperative nausea and vomiting)    PONV with gallbladder    Seasonal allergies    Sinus drainage    Snores    WEARS NIGHT GUARD   SUI (stress urinary incontinence, female)    Allergies Allergies  Allergen Reactions   Mucinex [Guaifenesin Er] Other (See Comments)    "feels weird and dizzy"   Tramadol Itching    SURGICAL HISTORY She  has a past surgical history that includes Laparoscopic gastric banding (11/05/2009   dr Hassell Done); Laparoscopic gastric banding with hiatal hernia repair (N/A, 01/22/2015); ORIF toe fracture (Left); Colonoscopy; Esophagogastroduodenoscopy; Total knee arthroplasty (Right, 02/17/2016); Cataract extraction w/ intraocular lens  implant, bilateral  (Bilateral, 05/2017); Total knee arthroplasty (Left, 07/13/2017); Breast lumpectomy (Left, 1972); Laparoscopic assisted vaginal hysterectomy (02-06-2004   dr Dellis Filbert); Dilation and curettage of uterus (multiple); Laparoscopic cholecystectomy (01-03-2002   dr Bubba Camp); Tubal ligation (Bilateral, yrs ago); and Total knee revision (Left, 05/18/2018). FAMILY HISTORY Her family history includes Cancer in her paternal grandfather; Heart disease in her father; Hyperlipidemia in her father. SOCIAL HISTORY She  reports that she quit smoking about 23 years ago. Her smoking use included cigarettes. She has a 7.00 pack-year smoking history. She has never used smokeless tobacco. She reports that she does not drink alcohol and does not use drugs. MEDICARE WELLNESS OBJECTIVES: Physical activity: Current Exercise Habits: Home exercise routine, Type of exercise: walking, Time (Minutes): 30, Frequency (Times/Week): 3, Weekly Exercise (Minutes/Week): 90, Intensity: Mild, Exercise limited by: orthopedic condition(s) Cardiac risk factors: Cardiac Risk Factors include: advanced age (>49men, >47 women);dyslipidemia;hypertension;sedentary lifestyle;obesity (BMI >30kg/m2) Depression/mood screen:   Depression screen [facility] 2/9 09/11/2021  Decreased Interest 0  Down, Depressed, Hopeless 0  PHQ - 2 Score 0    ADLs:  In your present state of health, do you have any difficulty performing the following activities: 09/11/2021  Hearing? N  Vision? N  Difficulty concentrating or making decisions? N  Walking or climbing stairs? Y  Comment left knee pain  Dressing or bathing? N  Doing errands, shopping? N  Some recent data might be hidden     Cognitive Testing  Alert? Yes  Normal Appearance?Yes  Oriented to person? Yes  Place? Yes   Time? Yes  Recall of three objects?  Yes  Can perform simple calculations? Yes  Displays appropriate judgment?Yes  Can read the correct time from a watch face?Yes  EOL planning: Does Patient Have  a Medical Advance Directive?: Yes Type of Advance Directive: Healthcare Power of Attorney, Living will Does patient want to make changes to medical advance directive?: No - Patient declined    Review of Systems  Constitutional:  Negative for chills, fever and weight loss.  HENT:  Negative for congestion and hearing loss.   Eyes:  Negative for blurred vision and double vision.  Respiratory:  Negative for cough and shortness of breath.   Cardiovascular:  Negative for chest pain, palpitations, orthopnea and leg swelling.  Gastrointestinal:  Negative for abdominal pain, constipation, diarrhea, heartburn, nausea and vomiting.  Musculoskeletal:  Negative for falls, joint pain and myalgias.  Skin:  Negative for rash.  Neurological:  Negative for dizziness, tingling, tremors, loss of consciousness and headaches.  Psychiatric/Behavioral:  Negative for depression, memory loss and suicidal ideas.    Physical Exam: Blood pressure 112/82, pulse 87, temperature 97.7 F (36.5 C), weight 228 lb 6.4 oz (103.6 kg), SpO2 96 %.  Body mass index is 44.61 kg/m. General Appearance: Well nourished, in no apparent distress. Eyes: PERRLA, EOMs, conjunctiva no swelling or erythema, normal fundi and vessels. Sinuses: No Frontal/maxillary tenderness ENT/Mouth: Ext aud canals clear, normal light reflex with TMs without erythema, bulging.  Good dentition. No erythema, swelling, or exudate on post pharynx. Tonsils not swollen or erythematous. Hearing normal.  Neck: Supple, thyroid normal. No bruits Respiratory: Respiratory effort normal, BS equal bilaterally without rales, rhonchi, wheezing or stridor. Cardio: RRR without murmurs, rubs or gallops. Brisk peripheral pulses without edema.  Chest: symmetric, with normal excursions and percussion. Breasts: defer Abdomen: Soft, +BS, obese, notenderness, no guarding, rebound, hernias, masses, or organomegaly. .  Lymphatics: Non tender without lymphadenopathy.   Genitourinary: defer Musculoskeletal: Full ROM all peripheral extremities,5/5 strength, and antalgic gait with cane. Skin: left cheek near neck with flesh colored nodule 2x3, no telangiectasias, erythema, will monitor. Warm, dry without rashes, lesions, ecchymosis.  Neuro: Cranial nerves intact, reflexes equal bilaterally. Normal muscle tone, no cerebellar symptoms. Sensation intact.  Psych: Awake and oriented X 3, normal affect, Insight and Judgment appropriate.       Medicare Attestation I have personally reviewed: [name] medical and social history Their use of alcohol, tobacco or illicit drugs Their current medications and supplements [name] functional ability including ADLs,fall risks, home safety risks, cognitive, and hearing and visual impairment Diet and physical activities Evidence for depression or mood disorders  [name] weight, height, BMI, and visual acuity have been recorded in the chart.  I have made referrals, counseling, and provided education to the patient based on review of the above and I have provided the patient with a written personalized care plan for preventive services.      Magda Bernheim ANP-C  [facility]  09/11/2021

## 2021-09-11 ENCOUNTER — Ambulatory Visit: Payer: Commercial Managed Care - PPO | Admitting: Nurse Practitioner

## 2021-09-11 ENCOUNTER — Encounter: Payer: Self-pay | Admitting: Nurse Practitioner

## 2021-09-11 ENCOUNTER — Other Ambulatory Visit: Payer: Self-pay

## 2021-09-11 VITALS — BP 112/82 | HR 87 | Temp 97.7°F | Wt 228.4 lb

## 2021-09-11 DIAGNOSIS — R6889 Other general symptoms and signs: Secondary | ICD-10-CM | POA: Diagnosis not present

## 2021-09-11 DIAGNOSIS — Z79899 Other long term (current) drug therapy: Secondary | ICD-10-CM

## 2021-09-11 DIAGNOSIS — Z0001 Encounter for general adult medical examination with abnormal findings: Secondary | ICD-10-CM

## 2021-09-11 DIAGNOSIS — J3089 Other allergic rhinitis: Secondary | ICD-10-CM

## 2021-09-11 DIAGNOSIS — R7309 Other abnormal glucose: Secondary | ICD-10-CM | POA: Diagnosis not present

## 2021-09-11 DIAGNOSIS — I10 Essential (primary) hypertension: Secondary | ICD-10-CM

## 2021-09-11 DIAGNOSIS — E785 Hyperlipidemia, unspecified: Secondary | ICD-10-CM | POA: Diagnosis not present

## 2021-09-11 DIAGNOSIS — K2100 Gastro-esophageal reflux disease with esophagitis, without bleeding: Secondary | ICD-10-CM

## 2021-09-11 DIAGNOSIS — E559 Vitamin D deficiency, unspecified: Secondary | ICD-10-CM

## 2021-09-11 DIAGNOSIS — K589 Irritable bowel syndrome without diarrhea: Secondary | ICD-10-CM

## 2021-09-11 DIAGNOSIS — M1990 Unspecified osteoarthritis, unspecified site: Secondary | ICD-10-CM

## 2021-09-11 DIAGNOSIS — Z87891 Personal history of nicotine dependence: Secondary | ICD-10-CM

## 2021-09-11 DIAGNOSIS — Z9884 Bariatric surgery status: Secondary | ICD-10-CM

## 2021-09-11 NOTE — Patient Instructions (Signed)

## 2021-09-12 LAB — CBC WITH DIFFERENTIAL/PLATELET
Absolute Monocytes: 469 cells/uL (ref 200–950)
Basophils Absolute: 40 cells/uL (ref 0–200)
Basophils Relative: 0.6 %
Eosinophils Absolute: 59 cells/uL (ref 15–500)
Eosinophils Relative: 0.9 %
HCT: 42.1 % (ref 35.0–45.0)
Hemoglobin: 14.3 g/dL (ref 11.7–15.5)
Lymphs Abs: 2138 cells/uL (ref 850–3900)
MCH: 33.6 pg — ABNORMAL HIGH (ref 27.0–33.0)
MCHC: 34 g/dL (ref 32.0–36.0)
MCV: 98.8 fL (ref 80.0–100.0)
MPV: 10.3 fL (ref 7.5–12.5)
Monocytes Relative: 7.1 %
Neutro Abs: 3894 cells/uL (ref 1500–7800)
Neutrophils Relative %: 59 %
Platelets: 289 10*3/uL (ref 140–400)
RBC: 4.26 10*6/uL (ref 3.80–5.10)
RDW: 12.2 % (ref 11.0–15.0)
Total Lymphocyte: 32.4 %
WBC: 6.6 10*3/uL (ref 3.8–10.8)

## 2021-09-12 LAB — COMPLETE METABOLIC PANEL WITH GFR
AG Ratio: 1.6 (calc) (ref 1.0–2.5)
ALT: 21 U/L (ref 6–29)
AST: 20 U/L (ref 10–35)
Albumin: 4.4 g/dL (ref 3.6–5.1)
Alkaline phosphatase (APISO): 101 U/L (ref 37–153)
BUN: 13 mg/dL (ref 7–25)
CO2: 34 mmol/L — ABNORMAL HIGH (ref 20–32)
Calcium: 11 mg/dL — ABNORMAL HIGH (ref 8.6–10.4)
Chloride: 99 mmol/L (ref 98–110)
Creat: 0.71 mg/dL (ref 0.50–1.05)
Globulin: 2.7 g/dL (calc) (ref 1.9–3.7)
Glucose, Bld: 79 mg/dL (ref 65–99)
Potassium: 4.3 mmol/L (ref 3.5–5.3)
Sodium: 140 mmol/L (ref 135–146)
Total Bilirubin: 0.5 mg/dL (ref 0.2–1.2)
Total Protein: 7.1 g/dL (ref 6.1–8.1)
eGFR: 94 mL/min/{1.73_m2} (ref >=60)

## 2021-09-12 LAB — LIPID PANEL
Cholesterol: 199 mg/dL (ref <200)
HDL: 68 mg/dL (ref >=50)
LDL Cholesterol (Calc): 96 mg/dL (calc)
Non-HDL Cholesterol (Calc): 131 mg/dL (calc) — ABNORMAL HIGH (ref <130)
Total CHOL/HDL Ratio: 2.9 (calc) (ref <5.0)
Triglycerides: 228 mg/dL — ABNORMAL HIGH (ref <150)

## 2021-09-12 LAB — HEMOGLOBIN A1C
Hgb A1c MFr Bld: 5.3 % of total Hgb (ref <5.7)
Mean Plasma Glucose: 105 mg/dL
eAG (mmol/L): 5.8 mmol/L

## 2021-09-23 ENCOUNTER — Other Ambulatory Visit: Payer: Self-pay | Admitting: Internal Medicine

## 2021-10-20 ENCOUNTER — Other Ambulatory Visit: Payer: Self-pay

## 2021-10-20 MED ORDER — HYDROCHLOROTHIAZIDE 12.5 MG PO TABS: ORAL_TABLET | ORAL | 0 refills | Status: DC

## 2021-10-20 MED ORDER — VENLAFAXINE HCL ER 75 MG PO CP24: ORAL_CAPSULE | ORAL | 0 refills | Status: DC

## 2021-11-18 ENCOUNTER — Ambulatory Visit (INDEPENDENT_AMBULATORY_CARE_PROVIDER_SITE_OTHER): Payer: Commercial Managed Care - PPO | Admitting: Obstetrics & Gynecology

## 2021-11-18 ENCOUNTER — Other Ambulatory Visit: Payer: Self-pay

## 2021-11-18 ENCOUNTER — Encounter: Payer: Self-pay | Admitting: Obstetrics & Gynecology

## 2021-11-18 VITALS — BP 112/70 | HR 68 | Resp 16 | Ht 59.75 in | Wt 230.0 lb

## 2021-11-18 DIAGNOSIS — Z01419 Encounter for gynecological examination (general) (routine) without abnormal findings: Secondary | ICD-10-CM | POA: Diagnosis not present

## 2021-11-18 DIAGNOSIS — Z780 Asymptomatic menopausal state: Secondary | ICD-10-CM

## 2021-11-18 DIAGNOSIS — Z90710 Acquired absence of both cervix and uterus: Secondary | ICD-10-CM

## 2021-11-18 DIAGNOSIS — M85852 Other specified disorders of bone density and structure, left thigh: Secondary | ICD-10-CM | POA: Diagnosis not present

## 2021-11-18 NOTE — Progress Notes (Signed)
? ? ?TEKEISHA HAKIM June 26, 1956 [id number] ? ? ?History:    66 y.o. G3P1A2L1 Married ?  ?RP: Established patient presenting for annual gyn exam  ?  ?HPI: S/P Total Hysterectomy.  Postmenopause, well on no hormone replacement therapy. No pelvic pain.  Abstinent. Pap Neg 10/2018.  Breast normal. Mammo Neg 12/2020.  Body mass index 45.3.  Loosing weight on Ozempic.  Health labs with family physician.  BMD Osteopenia Lt Femoral Neck T-Score -1.2 at Lt Fem Neck, other sites normal, on 11-21-19, will repeat at 2 yrs. COLONOSCOPY: 10/2018 ? ?Past medical history,surgical history, family history and social history were all reviewed and documented in the EPIC chart. ? ?Gynecologic History ?No LMP recorded. Patient has had a hysterectomy. ? ?Obstetric History ?OB History  ?Gravida Para Term Preterm AB Living  ?'3       2 1  '$ ?SAB IAB Ectopic Multiple Live Births  ?2       1  ?  ?# Outcome Date GA Lbr Len/2nd Weight Sex Delivery Anes PTL Lv  ?3 Gravida           ?2 SAB           ?1 SAB           ? ? ? ?ROS: A ROS was performed and pertinent positives and negatives are included in the history. ? GENERAL: No fevers or chills. HEENT: No change in vision, no earache, sore throat or sinus congestion. NECK: No pain or stiffness. CARDIOVASCULAR: No chest pain or pressure. No palpitations. PULMONARY: No shortness of breath, cough or wheeze. GASTROINTESTINAL: No abdominal pain, nausea, vomiting or diarrhea, melena or bright red blood per rectum. GENITOURINARY: No urinary frequency, urgency, hesitancy or dysuria. MUSCULOSKELETAL: No joint or muscle pain, no back pain, no recent trauma. DERMATOLOGIC: No rash, no itching, no lesions. ENDOCRINE: No polyuria, polydipsia, no heat or cold intolerance. No recent change in weight. HEMATOLOGICAL: No anemia or easy bruising or bleeding. NEUROLOGIC: No headache, seizures, numbness, tingling or weakness. PSYCHIATRIC: No depression, no loss of interest in normal activity or change in sleep pattern.  ?   ? ?Exam: ? ? ?BP 112/70   Pulse 68   Resp 16   Ht 4' 11.75" (1.518 m)   Wt 230 lb (104.3 kg)   BMI 45.30 kg/m?  ? ?Body mass index is 45.3 kg/m?. ? ?General appearance : Well developed well nourished female. No acute distress ?HEENT: Eyes: no retinal hemorrhage or exudates,  Neck supple, trachea midline, no carotid bruits, no thyroidmegaly ?Lungs: Clear to auscultation, no rhonchi or wheezes, or rib retractions  ?Heart: Regular rate and rhythm, no murmurs or gallops ?Breast:Examined in sitting and supine position were symmetrical in appearance, no palpable masses or tenderness,  no skin retraction, no nipple inversion, no nipple discharge, no skin discoloration, no axillary or supraclavicular lymphadenopathy ?Abdomen: no palpable masses or tenderness, no rebound or guarding ?Extremities: no edema or skin discoloration or tenderness ? ?Pelvic: Vulva: Normal ?            Vagina: No gross lesions or discharge ? Cervix/Uterus absent ? Adnexa  Without masses or tenderness ? Anus: Normal ? ? ?Assessment/Plan:  66 y.o. female for annual exam  ? ?1. Well female exam with routine gynecological exam ?S/P Total Hysterectomy.  Postmenopause, well on no hormone replacement therapy. No pelvic pain.  Abstinent. Pap Neg 10/2018.  Breast normal. Mammo Neg 12/2020.  Body mass index 45.3.  Loosing weight on Ozempic.  Health labs with  family physician.  BMD Osteopenia Lt Femoral Neck T-Score -1.2 at Lt Fem Neck, other sites normal, on 11-21-19, will repeat at 2 yrs. COLONOSCOPY: 10/2018. ? ?2. S/P laparoscopic assisted vaginal hysterectomy (LAVH) ? ?3. Postmenopausal  ?S/P Total Hysterectomy.  Postmenopause, well on no hormone replacement therapy. No pelvic pain.  Abstinent. ? ?4. Osteopenia of neck of left femur  ?BMD Osteopenia Lt Femoral Neck T-Score -1.2 at Lt Fem Neck, other sites normal, on 11-21-19, will repeat at 2 yrs.  Vit D, Ca++ 1.5 g/d total, regular wt bearing physical activities. ? ?[name] MD, 2:05 PM  11/18/2021 ? ?  ?

## 2022-01-02 ENCOUNTER — Other Ambulatory Visit: Payer: Self-pay | Admitting: Nurse Practitioner

## 2022-01-30 ENCOUNTER — Encounter: Payer: Self-pay | Admitting: Obstetrics & Gynecology

## 2022-02-26 ENCOUNTER — Other Ambulatory Visit: Payer: Self-pay | Admitting: Nurse Practitioner

## 2022-02-26 DIAGNOSIS — R7309 Other abnormal glucose: Secondary | ICD-10-CM

## 2022-02-26 MED ORDER — OZEMPIC (1 MG/DOSE) 4 MG/3ML ~~LOC~~ SOPN: 1.0000 mg | PEN_INJECTOR | SUBCUTANEOUS | 1 refills | Status: DC

## 2022-03-12 ENCOUNTER — Other Ambulatory Visit: Payer: Self-pay | Admitting: Adult Health

## 2022-03-18 ENCOUNTER — Ambulatory Visit: Payer: Commercial Managed Care - PPO | Admitting: Nurse Practitioner

## 2022-03-30 NOTE — Progress Notes (Unsigned)
6 MONTH FOLLOWUP  Assessment and Plan: Maureen [name] was seen today for follow-up.  Diagnoses and all orders for this visit:  Essential hypertension -     CBC with Differential/Platelet -  continue medications, DASH diet, exercise and monitor at home. Call if greater than 130/80.    Hyperlipidemia, unspecified hyperlipidemia type -     COMPLETE METABOLIC PANEL WITH GFR -     Lipid panel - Continue low cholesterol diet and exercise  Morbid obesity (HCC)  S/P lap band which was able to be refilled with 1/2 cc without side effects  Continue Ozempic '1mg'$  SQ QW  Continue diet and exercise  LAP-BAND surgery status  Dr. Hassell Done continues to follow  Medication management  Continued  Abnormal glucose Continue Ozempic, diet and exercise - A1c   Vitamin D deficiency Continue Vit D supplementation to maintain value in therapeutic level of 60-100  - Vit D     Further disposition pending results of labs. Discussed med's effects and SE's.   Over 30 minutes of exam, counseling, chart review, and critical decision making was performed.   Future Appointments  Date Time Provider [place]  03/31/2022  3:30 PM Alycia Rossetti, NP [facility] None  07/28/2022  3:00 PM Brendolyn Patty, MD [facility] None  09/14/2022 11:00 AM Alycia Rossetti, NP [facility] None  11/20/2022  9:30 AM [name], MD [facility] None    ------------------------------------------------------------------------------------------------------------------   HPI There were no vitals taken for this visit.  66 y.o.female with morbid obesity, htn, hyperlipidemia, abnormal glucose presents for 3 month follow up  She does have hx of OSA that had resolved with weight loss, continues to treat per patient with oral device from dentist - has upcoming follow up.  BMI is There is no height or weight on file to calculate BMI., Has been working on diet and exercise, just retired and trying to be more active. Discussed  semaglutide as new option; she is very interested in trying this prior to follow up with Dr. Melford Aase.  Wt Readings from Last 3 Encounters:  11/18/21 230 lb (104.3 kg)  09/11/21 228 lb 6.4 oz (103.6 kg)  06/10/21 229 lb 12.8 oz (104.2 kg)   Dr. Hassell Done did put 1/2 cc back into band and has no problems.   Past Medical History:  Diagnosis Date   Arthritis    LEFT KNEE, LOWER BACK   GERD (gastroesophageal reflux disease)    History of hiatal hernia    History of obstructive sleep apnea    per pt history osa w/ cpap until wt loss after gastric band, retested negative for sleep apnea   Hypertension    IBS (irritable bowel syndrome)    Numbness    BOTTOM LEFT FOOT   PONV (postoperative nausea and vomiting)    PONV with gallbladder    Seasonal allergies    Sinus drainage    Snores    WEARS NIGHT GUARD   SUI (stress urinary incontinence, female)      Allergies  Allergen Reactions   Mucinex [Guaifenesin Er] Other (See Comments)    "feels weird and dizzy"   Tramadol Itching    Current Outpatient Medications on File Prior to Visit  Medication Sig   Cholecalciferol (VITAMIN D-3) 5000 units TABS Take 5,000 Units by mouth daily.   ELDERBERRY PO Take by mouth.   fluticasone (FLONASE) 50 MCG/ACT nasal spray PLACE 1-2 SPRAYS INTO BOTH NOSTRILS AT BEDTIME.   hydrochlorothiazide (HYDRODIURIL) 12.5 MG tablet TAKE 1 TABLET BY MOUTH  EVERY DAY FOR BLOOD PRESSURE AND FLUID.   lisinopril (ZESTRIL) 40 MG tablet TAKE ONE TABLET BY MOUTH EVERY DAY FOR BLOOD PRESSURE   loratadine (CLARITIN) 10 MG tablet Take 10 mg by mouth every morning.    Multiple Vitamin (MULTIVITAMIN) capsule Take 1 capsule by mouth daily.    omeprazole (PRILOSEC) 20 MG capsule TAKE 1 CAPSULE EVERY DAY FOR ACID INDIGESTION AND REFLUX   Semaglutide, 1 MG/DOSE, (OZEMPIC, 1 MG/DOSE,) 4 MG/3ML SOPN Inject 1 mg into the skin once a week.   venlafaxine XR (EFFEXOR-XR) 75 MG 24 hr capsule TAKE 1 CAPSULE BY MOUTH ONCE DAILY FOR MOOD.    Zinc 15 MG CAPS Take by mouth.   No current facility-administered medications on file prior to visit.   Review of Systems  Constitutional:  Negative for chills, malaise/fatigue and weight loss.  HENT:  Negative for congestion, ear discharge, hearing loss, sore throat and tinnitus.   Eyes:  Negative for blurred vision, discharge and redness.  Respiratory:  Negative for cough, shortness of breath and wheezing.   Cardiovascular:  Negative for chest pain, palpitations, orthopnea and leg swelling.  Gastrointestinal:  Negative for abdominal pain, blood in stool, constipation, diarrhea, heartburn, nausea and vomiting.  Genitourinary:  Negative for dysuria, frequency and urgency.  Musculoskeletal:  Positive for joint pain (hip pain bilaterally, TENS relieves). Negative for back pain, falls and myalgias.  Skin:  Negative for rash.  Neurological:  Negative for dizziness, tingling, seizures, weakness and headaches.  Endo/Heme/Allergies:  Does not bruise/bleed easily.  Psychiatric/Behavioral:  Negative for depression, hallucinations, memory loss and suicidal ideas. The patient does not have insomnia.      Physical Exam:  There were no vitals taken for this visit.  General Appearance: Well nourished, in no apparent distress. Eyes: PERRLA, EOMs, conjunctiva no swelling or erythema Sinuses: No Frontal/maxillary tenderness ENT/Mouth: Ext aud canals clear, TMs without erythema, bulging. No erythema, swelling, or exudate on post pharynx.  Tonsils not swollen or erythematous. Large tongue/small oral cavity. Hearing normal.  Neck: Supple, thyroid normal.  Respiratory: Respiratory effort normal, BS equal bilaterally without rales, rhonchi, wheezing or stridor.  Cardio: RRR with no MRGs. Brisk peripheral pulses without edema.  Abdomen: Soft, central obesity, + BS.  Mild epigastric tenderness, no guarding, rebound, palpable hernias, masses. Lymphatics: Non tender without lymphadenopathy.  Musculoskeletal:  no obvious deformity, normal gait.  Skin: Warm, dry without rashes, lesions, ecchymosis.  Neuro: Normal muscle tone, no cerebellar symptoms. Sensation intact.  Psych: Awake and oriented X 3, normal affect, Insight and Judgment appropriate.     Alycia Rossetti, NP 1:51 PM [facility]

## 2022-03-31 ENCOUNTER — Encounter: Payer: Self-pay | Admitting: Nurse Practitioner

## 2022-03-31 ENCOUNTER — Ambulatory Visit (INDEPENDENT_AMBULATORY_CARE_PROVIDER_SITE_OTHER): Payer: BLUE CROSS/BLUE SHIELD | Admitting: Nurse Practitioner

## 2022-03-31 VITALS — BP 104/74 | HR 90 | Temp 97.9°F | Ht 59.75 in | Wt 231.2 lb

## 2022-03-31 DIAGNOSIS — R7309 Other abnormal glucose: Secondary | ICD-10-CM

## 2022-03-31 DIAGNOSIS — E785 Hyperlipidemia, unspecified: Secondary | ICD-10-CM | POA: Diagnosis not present

## 2022-03-31 DIAGNOSIS — I10 Essential (primary) hypertension: Secondary | ICD-10-CM | POA: Diagnosis not present

## 2022-03-31 DIAGNOSIS — Z9884 Bariatric surgery status: Secondary | ICD-10-CM | POA: Diagnosis not present

## 2022-03-31 DIAGNOSIS — E559 Vitamin D deficiency, unspecified: Secondary | ICD-10-CM

## 2022-03-31 DIAGNOSIS — Z79899 Other long term (current) drug therapy: Secondary | ICD-10-CM

## 2022-03-31 MED ORDER — LISINOPRIL 20 MG PO TABS: 20.0000 mg | ORAL_TABLET | Freq: Every day | ORAL | 11 refills | Status: DC

## 2022-03-31 MED ORDER — OZEMPIC (2 MG/DOSE) 8 MG/3ML ~~LOC~~ SOPN: 2.0000 mg | PEN_INJECTOR | SUBCUTANEOUS | 3 refills | Status: DC

## 2022-03-31 NOTE — Patient Instructions (Signed)
Fair life protein shakes Eat more frequently - try not to go more than 6 hours without protein Aim for 90 grams of protein a day- 30 breakfast/30 lunch 30 dinner Try to keep net carbs less than 50 Net Carbs=Total Carbs-fiber- sugar alcohols Exercise heartrate 120-140(fat burning zone)- walking 20-30 minutes 4 days a week   Decrease lisinopril to 20 mg daily.  New script sent to pharmacy.  Check BP daily and keep a log to bring with you in 1 month  Increase Ozempic- do 2 shots weekly on same day of current dose then pick up new prescription of 2 mg SQ QW

## 2022-04-01 LAB — HEMOGLOBIN A1C
Hgb A1c MFr Bld: 5.2 % of total Hgb (ref <5.7)
Mean Plasma Glucose: 103 mg/dL
eAG (mmol/L): 5.7 mmol/L

## 2022-04-01 LAB — CBC WITH DIFFERENTIAL/PLATELET
Absolute Monocytes: 490 cells/uL (ref 200–950)
Basophils Absolute: 61 cells/uL (ref 0–200)
Basophils Relative: 0.9 %
Eosinophils Absolute: 129 cells/uL (ref 15–500)
Eosinophils Relative: 1.9 %
HCT: 40.6 % (ref 35.0–45.0)
Hemoglobin: 14 g/dL (ref 11.7–15.5)
Lymphs Abs: 2462 cells/uL (ref 850–3900)
MCH: 33.8 pg — ABNORMAL HIGH (ref 27.0–33.0)
MCHC: 34.5 g/dL (ref 32.0–36.0)
MCV: 98.1 fL (ref 80.0–100.0)
MPV: 10.6 fL (ref 7.5–12.5)
Monocytes Relative: 7.2 %
Neutro Abs: 3658 cells/uL (ref 1500–7800)
Neutrophils Relative %: 53.8 %
Platelets: 278 10*3/uL (ref 140–400)
RBC: 4.14 10*6/uL (ref 3.80–5.10)
RDW: 12.4 % (ref 11.0–15.0)
Total Lymphocyte: 36.2 %
WBC: 6.8 10*3/uL (ref 3.8–10.8)

## 2022-04-01 LAB — COMPLETE METABOLIC PANEL WITH GFR
AG Ratio: 1.6 (calc) (ref 1.0–2.5)
ALT: 19 U/L (ref 6–29)
AST: 17 U/L (ref 10–35)
Albumin: 4.1 g/dL (ref 3.6–5.1)
Alkaline phosphatase (APISO): 95 U/L (ref 37–153)
BUN: 20 mg/dL (ref 7–25)
CO2: 29 mmol/L (ref 20–32)
Calcium: 9.8 mg/dL (ref 8.6–10.4)
Chloride: 99 mmol/L (ref 98–110)
Creat: 0.8 mg/dL (ref 0.50–1.05)
Globulin: 2.5 g/dL (calc) (ref 1.9–3.7)
Glucose, Bld: 79 mg/dL (ref 65–99)
Potassium: 4.4 mmol/L (ref 3.5–5.3)
Sodium: 138 mmol/L (ref 135–146)
Total Bilirubin: 0.3 mg/dL (ref 0.2–1.2)
Total Protein: 6.6 g/dL (ref 6.1–8.1)
eGFR: 81 mL/min/{1.73_m2} (ref >=60)

## 2022-04-01 LAB — LIPID PANEL
Cholesterol: 197 mg/dL (ref <200)
HDL: 63 mg/dL (ref >=50)
LDL Cholesterol (Calc): 93 mg/dL (calc)
Non-HDL Cholesterol (Calc): 134 mg/dL (calc) — ABNORMAL HIGH (ref <130)
Total CHOL/HDL Ratio: 3.1 (calc) (ref <5.0)
Triglycerides: 308 mg/dL — ABNORMAL HIGH (ref <150)

## 2022-04-01 LAB — VITAMIN D 25 HYDROXY (VIT D DEFICIENCY, FRACTURES): Vit D, 25-Hydroxy: 65 ng/mL (ref 30–100)

## 2022-04-15 ENCOUNTER — Other Ambulatory Visit: Payer: Self-pay | Admitting: Internal Medicine

## 2022-04-29 NOTE — Progress Notes (Signed)
Assessment and Plan:  Surah was seen today for follow-up.  Diagnoses and all orders for this visit:  Morbid obesity ([place]) Continue diet, exercise Limit saturated fats and simple cars, increase protein intake -     Semaglutide, 1 MG/DOSE, 4 MG/3ML SOPN; Inject 1 mg into the skin once a week.  Abnormal glucose Continue diet, exercise , weight loss and Ozempic Was unable to get 2 mg filled due to shortage will continue on 1 mg dose -     Semaglutide, 1 MG/DOSE, 4 MG/3ML SOPN; Inject 1 mg into the skin once a week.  Essential hypertension - continue medications- BP's will controlled with decreased dose of Lisinopril 20 mg, DASH diet, exercise and monitor at home. Call if greater than 130/80.  Go to the ER if any chest pain, shortness of breath, nausea, dizziness, severe HA, changes vision/speech       Further disposition pending results of labs. Discussed med's effects and SE's.   Over 30 minutes of exam, counseling, chart review, and critical decision making was performed.   Future Appointments  Date Time Provider [place]  07/28/2022  3:00 PM Brendolyn Patty, MD [facility] None  09/14/2022 11:00 AM Alycia Rossetti, NP [facility] None  11/20/2022  9:30 AM [name], MD [facility] None    ------------------------------------------------------------------------------------------------------------------   HPI BP 120/78   Pulse 86   Temp 97.9 F (36.6 C)   Resp 16   Ht 4' 11.75" (1.518 m)   Wt 229 lb 3.2 oz (104 kg)   SpO2 96%   BMI 45.14 kg/m    66 y.o.female presents for BP reevaluation  Lisinopril was decreased to 20 mg daily at last visit . BP's have been running 110-130/70's.  Denies headaches, chest pain, shortness of breath and dizziness.  BP Readings from Last 3 Encounters:  05/01/22 120/78  03/31/22 104/74  11/18/21 112/70   BMI is Body mass index is 45.14 kg/m., she has been working on diet and exercise. Wt Readings from Last 3 Encounters:   05/01/22 229 lb 3.2 oz (104 kg)  03/31/22 231 lb 3.2 oz (104.9 kg)  11/18/21 230 lb (104.3 kg)      Past Medical History:  Diagnosis Date   Arthritis    LEFT KNEE, LOWER BACK   GERD (gastroesophageal reflux disease)    History of hiatal hernia    History of obstructive sleep apnea    per pt history osa w/ cpap until wt loss after gastric band, retested negative for sleep apnea   Hypertension    IBS (irritable bowel syndrome)    Numbness    BOTTOM LEFT FOOT   PONV (postoperative nausea and vomiting)    PONV with gallbladder    Seasonal allergies    Sinus drainage    Snores    WEARS NIGHT GUARD   SUI (stress urinary incontinence, female)      Allergies  Allergen Reactions   Mucinex [Guaifenesin Er] Other (See Comments)    "feels weird and dizzy"   Tramadol Itching    Current Outpatient Medications on File Prior to Visit  Medication Sig   Cholecalciferol (VITAMIN D-3) 5000 units TABS Take 5,000 Units by mouth daily.   ELDERBERRY PO Take by mouth.   fluticasone (FLONASE) 50 MCG/ACT nasal spray Place 1-2 sprays into both nostrils at bedtime.   hydrochlorothiazide (HYDRODIURIL) 12.5 MG tablet TAKE 1 TABLET BY MOUTH EVERY DAY FOR BLOOD PRESSURE AND FLUID.   lisinopril (ZESTRIL) 20 MG tablet Take 1 tablet (20 mg  total) by mouth daily.   loratadine (CLARITIN) 10 MG tablet Take 10 mg by mouth every morning.    Multiple Vitamin (MULTIVITAMIN) capsule Take 1 capsule by mouth daily.    omeprazole (PRILOSEC) 20 MG capsule TAKE 1 CAPSULE EVERY DAY FOR ACID INDIGESTION AND REFLUX   OVER THE COUNTER MEDICATION Balance of nature   Semaglutide, 2 MG/DOSE, (OZEMPIC, 2 MG/DOSE,) 8 MG/3ML SOPN Inject 2 mg into the skin once a week.   venlafaxine XR (EFFEXOR-XR) 75 MG 24 hr capsule TAKE 1 CAPSULE BY MOUTH ONCE DAILY FOR MOOD.   Zinc 15 MG CAPS Take by mouth.   No current facility-administered medications on file prior to visit.    ROS: all negative except above.   Physical  Exam:  BP 120/78   Pulse 86   Temp 97.9 F (36.6 C)   Resp 16   Ht 4' 11.75" (1.518 m)   Wt 229 lb 3.2 oz (104 kg)   SpO2 96%   BMI 45.14 kg/m   General Appearance: Well nourished, in no apparent distress. Eyes: PERRLA, EOMs, conjunctiva no swelling or erythema Sinuses: No Frontal/maxillary tenderness ENT/Mouth: Ext aud canals clear, TMs without erythema, bulging. No erythema, swelling, or exudate on post pharynx.  Tonsils not swollen or erythematous. Hearing normal.  Neck: Supple, thyroid normal.  Respiratory: Respiratory effort normal, BS equal bilaterally without rales, rhonchi, wheezing or stridor.  Cardio: RRR with no MRGs. Brisk peripheral pulses without edema.  Abdomen: Soft, + BS.  Non tender, no guarding, rebound, hernias, masses. Lymphatics: Non tender without lymphadenopathy.  Musculoskeletal: Full ROM, 5/5 strength, normal gait.  Skin: Warm, dry without rashes, lesions, ecchymosis.  Neuro: Cranial nerves intact. Normal muscle tone, no cerebellar symptoms. Sensation intact.  Psych: Awake and oriented X 3, normal affect, Insight and Judgment appropriate.     Alycia Rossetti, NP 10:35 AM [facility]

## 2022-05-01 ENCOUNTER — Encounter: Payer: Self-pay | Admitting: Nurse Practitioner

## 2022-05-01 ENCOUNTER — Ambulatory Visit (INDEPENDENT_AMBULATORY_CARE_PROVIDER_SITE_OTHER): Payer: BLUE CROSS/BLUE SHIELD | Admitting: Nurse Practitioner

## 2022-05-01 DIAGNOSIS — R7309 Other abnormal glucose: Secondary | ICD-10-CM

## 2022-05-01 DIAGNOSIS — I10 Essential (primary) hypertension: Secondary | ICD-10-CM

## 2022-05-01 MED ORDER — SEMAGLUTIDE (1 MG/DOSE) 4 MG/3ML ~~LOC~~ SOPN: 1.0000 mg | PEN_INJECTOR | SUBCUTANEOUS | 3 refills | Status: DC

## 2022-07-20 ENCOUNTER — Other Ambulatory Visit: Payer: Self-pay

## 2022-07-20 MED ORDER — HYDROCHLOROTHIAZIDE 12.5 MG PO TABS: ORAL_TABLET | ORAL | 1 refills | Status: DC

## 2022-07-20 MED ORDER — VENLAFAXINE HCL ER 75 MG PO CP24: ORAL_CAPSULE | ORAL | 1 refills | Status: DC

## 2022-07-28 ENCOUNTER — Ambulatory Visit: Payer: BLUE CROSS/BLUE SHIELD | Admitting: Dermatology

## 2022-07-28 ENCOUNTER — Encounter: Payer: Self-pay | Admitting: Dermatology

## 2022-07-28 VITALS — BP 147/83 | HR 89

## 2022-07-28 DIAGNOSIS — L820 Inflamed seborrheic keratosis: Secondary | ICD-10-CM

## 2022-07-28 DIAGNOSIS — L814 Other melanin hyperpigmentation: Secondary | ICD-10-CM

## 2022-07-28 DIAGNOSIS — L578 Other skin changes due to chronic exposure to nonionizing radiation: Secondary | ICD-10-CM

## 2022-07-28 DIAGNOSIS — D229 Melanocytic nevi, unspecified: Secondary | ICD-10-CM | POA: Diagnosis not present

## 2022-07-28 DIAGNOSIS — Z1283 Encounter for screening for malignant neoplasm of skin: Secondary | ICD-10-CM

## 2022-07-28 DIAGNOSIS — L821 Other seborrheic keratosis: Secondary | ICD-10-CM | POA: Diagnosis not present

## 2022-07-28 DIAGNOSIS — L853 Xerosis cutis: Secondary | ICD-10-CM

## 2022-07-28 NOTE — Progress Notes (Signed)
New Patient Visit  Subjective  Maureen [name] is a 66 y.o. female who presents for the following: TBSE (Patient with hx of AK's. Seen in our office prior to losing records. Patient has a spot at left upper forehead that was raised and itching, has since gotten flat. Present for at least 6 months.). Couple spots on her back irritated by clothing.  The patient presents for Total-Body Skin Exam (TBSE) for skin cancer screening and mole check.  The patient has spots, moles and lesions to be evaluated, some may be new or changing and the patient has concerns that these could be cancer.   The following portions of the chart were reviewed this encounter and updated as appropriate:       Review of Systems:  No other skin or systemic complaints except as noted in HPI or Assessment and Plan.  Objective  Well appearing patient in no apparent distress; mood and affect are within normal limits.  A full examination was performed including scalp, head, eyes, ears, nose, lips, neck, chest, axillae, abdomen, back, buttocks, bilateral upper extremities, bilateral lower extremities, hands, feet, fingers, toes, fingernails, and toenails. All findings within normal limits unless otherwise noted below.  Left Upper Forehead Stuck-on, waxy, tan-brown papule, small residual -- Discussed benign etiology and prognosis.   Right Mid Upper Back x 1, spinal mid back x 1 (2) Erythematous stuck-on, waxy papule  Left Upper Arm 1.0 cm waxy tan macule, darker inferior    Assessment & Plan  Inflamed seborrheic keratosis (3) Right Mid Upper Back x 1, spinal mid back x 1 (2); Left Upper Forehead  Self resolved, observed at left upper forehead  Symptomatic, irritating, patient would like treated at back.   Destruction of lesion - Right Mid Upper Back x 1, spinal mid back x 1  Destruction method: cryotherapy   Informed consent: discussed and consent obtained   Lesion destroyed using liquid nitrogen: Yes    Region frozen until ice ball extended beyond lesion: Yes   Outcome: patient tolerated procedure well with no complications   Post-procedure details: wound care instructions given   Additional details:  Prior to procedure, discussed risks of blister formation, small wound, skin dyspigmentation, or rare scar following cryotherapy. Recommend Vaseline ointment to treated areas while healing.   Seborrheic keratosis Left Upper Arm  Reassured benign age-related growth.  Recommend observation.  Discussed cryotherapy if spot(s) become irritated or inflamed.   Lentigines - Scattered tan macules - Due to sun exposure - Benign-appearing, observe - Recommend daily broad spectrum sunscreen SPF 30+ to sun-exposed areas, reapply every 2 hours as needed. - Call for any changes  Seborrheic Keratoses - Stuck-on, waxy, tan-brown papules and/or plaques  - Benign-appearing - Discussed benign etiology and prognosis. - Observe - Call for any changes  Melanocytic Nevi - Tan-brown and/or pink-flesh-colored symmetric macules and papules - Benign appearing on exam today - Observation - Call clinic for new or changing moles - Recommend daily use of broad spectrum spf 30+ sunscreen to sun-exposed areas.   Hemangiomas - Red papules - Discussed benign nature - Observe - Call for any changes  Actinic Damage - Chronic condition, secondary to cumulative UV/sun exposure - diffuse scaly erythematous macules with underlying dyspigmentation - Recommend daily broad spectrum sunscreen SPF 30+ to sun-exposed areas, reapply every 2 hours as needed.  - Staying in the shade or wearing long sleeves, sun glasses (UVA+UVB protection) and wide brim hats (4-inch brim around the entire circumference of the hat) are  also recommended for sun protection.  - Call for new or changing lesions.  Skin cancer screening performed today.  Xerosis - diffuse xerotic patches - recommend gentle, hydrating skin care - gentle skin  care handout given  Return in about 1 year (around 07/29/2023) for TBSE.  Graciella Belton, RMA, am acting as scribe for Brendolyn Patty, MD .  Documentation: I have reviewed the above documentation for accuracy and completeness, and I agree with the above.  Brendolyn Patty MD

## 2022-07-28 NOTE — Patient Instructions (Signed)
     Melanoma ABCDEs  Melanoma is the most dangerous type of skin cancer, and is the leading cause of death from skin disease.  You are more likely to develop melanoma if you: Have light-colored skin, light-colored eyes, or red or blond hair Spend a lot of time in the sun Tan regularly, either outdoors or in a tanning bed Have had blistering sunburns, especially during childhood Have a close family member who has had a melanoma Have atypical moles or large birthmarks  Early detection of melanoma is key since treatment is typically straightforward and cure rates are extremely high if we catch it early.   The first sign of melanoma is often a change in a mole or a new dark spot.  The ABCDE system is a way of remembering the signs of melanoma.  A for asymmetry:  The two halves do not match. B for border:  The edges of the growth are irregular. C for color:  A mixture of colors are present instead of an even brown color. D for diameter:  Melanomas are usually (but not always) greater than 6mm - the size of a pencil eraser. E for evolution:  The spot keeps changing in size, shape, and color.  Please check your skin once per month between visits. You can use a small mirror in front and a large mirror behind you to keep an eye on the back side or your body.   If you see any new or changing lesions before your next follow-up, please call to schedule a visit.  Please continue daily skin protection including broad spectrum sunscreen SPF 30+ to sun-exposed areas, reapplying every 2 hours as needed when you're outdoors.   Staying in the shade or wearing long sleeves, sun glasses (UVA+UVB protection) and wide brim hats (4-inch brim around the entire circumference of the hat) are also recommended for sun protection.    Due to recent changes in healthcare laws, you may see results of your pathology and/or laboratory studies on MyChart before the doctors have had a chance to review them. We  understand that in some cases there may be results that are confusing or concerning to you. Please understand that not all results are received at the same time and often the doctors may need to interpret multiple results in order to provide you with the best plan of care or course of treatment. Therefore, we ask that you please give us 2 business days to thoroughly review all your results before contacting the office for clarification. Should we see a critical lab result, you will be contacted sooner.   If You Need Anything After Your Visit  If you have any questions or concerns for your doctor, please call our main line at [phone] and press option 4 to reach your doctor's medical assistant. If no one answers, please leave a voicemail as directed and we will return your call as soon as possible. Messages left after 4 pm will be answered the following business day.   You may also send us a message via MyChart. We typically respond to MyChart messages within 1-2 business days.  For prescription refills, please ask your pharmacy to contact our office. Our fax number is [phone].  If you have an urgent issue when the clinic is closed that cannot wait until the next business day, you can page your doctor at the number below.    Please note that while we do our best to be available for urgent issues   outside of office hours, we are not available 24/7.   If you have an urgent issue and are unable to reach us, you may choose to seek medical care at your doctor's office, retail clinic, urgent care center, or emergency room.  If you have a medical emergency, please immediately call 911 or go to the emergency department.  Pager Numbers  - Dr. Kowalski: [phone]  - Dr. Moye: [phone]  - Dr. Stewart: [phone]  In the event of inclement weather, please call our main line at [phone] for an update on the status of any delays or closures.  Dermatology Medication Tips: Please  keep the boxes that topical medications come in in order to help keep track of the instructions about where and how to use these. Pharmacies typically print the medication instructions only on the boxes and not directly on the medication tubes.   If your medication is too expensive, please contact our office at [phone] option 4 or send us a message through MyChart.   We are unable to tell what your co-pay for medications will be in advance as this is different depending on your insurance coverage. However, we may be able to find a substitute medication at lower cost or fill out paperwork to get insurance to cover a needed medication.   If a prior authorization is required to get your medication covered by your insurance company, please allow us 1-2 business days to complete this process.  Drug prices often vary depending on where the prescription is filled and some pharmacies may offer cheaper prices.  The website www.goodrx.com contains coupons for medications through different pharmacies. The prices here do not account for what the cost may be with help from insurance (it may be cheaper with your insurance), but the website can give you the price if you did not use any insurance.  - You can print the associated coupon and take it with your prescription to the pharmacy.  - You may also stop by our office during regular business hours and pick up a GoodRx coupon card.  - If you need your prescription sent electronically to a different pharmacy, notify our office through [facility] MyChart or by phone at [phone] option 4.     Si Usted Necesita Algo Despus de Su Visita  Tambin puede enviarnos un mensaje a travs de MyChart. Por lo general respondemos a los mensajes de MyChart en el transcurso de 1 a 2 das hbiles.  Para renovar recetas, por favor pida a su farmacia que se ponga en contacto con nuestra oficina. Nuestro nmero de fax es el [phone].  Si tiene un asunto urgente  cuando la clnica est cerrada y que no puede esperar hasta el siguiente da hbil, puede llamar/localizar a su doctor(a) al nmero que aparece a continuacin.   Por favor, tenga en cuenta que aunque hacemos todo lo posible para estar disponibles para asuntos urgentes fuera del horario de oficina, no estamos disponibles las 24 horas del da, los 7 das de la semana.   Si tiene un problema urgente y no puede comunicarse con nosotros, puede optar por buscar atencin mdica  en el consultorio de su doctor(a), en una clnica privada, en un centro de atencin urgente o en una sala de emergencias.  Si tiene una emergencia mdica, por favor llame inmediatamente al 911 o vaya a la sala de emergencias.  Nmeros de bper  - Dr. Kowalski: [phone]  - Dra. Moye: [phone]  - Dra. Stewart: [phone]  En caso   de inclemencias del tiempo, por favor llame a nuestra lnea principal al [phone] para una actualizacin sobre el estado de cualquier retraso o cierre.  Consejos para la medicacin en dermatologa: Por favor, guarde las cajas en las que vienen los medicamentos de uso tpico para ayudarle a seguir las instrucciones sobre dnde y cmo usarlos. Las farmacias generalmente imprimen las instrucciones del medicamento slo en las cajas y no directamente en los tubos del medicamento.   Si su medicamento es muy caro, por favor, pngase en contacto con nuestra oficina llamando al [phone] y presione la opcin 4 o envenos un mensaje a travs de MyChart.   No podemos decirle cul ser su copago por los medicamentos por adelantado ya que esto es diferente dependiendo de la cobertura de su seguro. Sin embargo, es posible que podamos encontrar un medicamento sustituto a menor costo o llenar un formulario para que el seguro cubra el medicamento que se considera necesario.   Si se requiere una autorizacin previa para que su compaa de seguros cubra su medicamento, por favor permtanos de 1 a 2  das hbiles para completar este proceso.  Los precios de los medicamentos varan con frecuencia dependiendo del lugar de dnde se surte la receta y alguna farmacias pueden ofrecer precios ms baratos.  El sitio web www.goodrx.com tiene cupones para medicamentos de diferentes farmacias. Los precios aqu no tienen en cuenta lo que podra costar con la ayuda del seguro (puede ser ms barato con su seguro), pero el sitio web puede darle el precio si no utiliz ningn seguro.  - Puede imprimir el cupn correspondiente y llevarlo con su receta a la farmacia.  - Tambin puede pasar por nuestra oficina durante el horario de atencin regular y recoger una tarjeta de cupones de GoodRx.  - Si necesita que su receta se enve electrnicamente a una farmacia diferente, informe a nuestra oficina a travs de MyChart de [facility] o por telfono llamando al [phone] y presione la opcin 4.  

## 2022-08-07 ENCOUNTER — Encounter: Payer: Self-pay | Admitting: Nurse Practitioner

## 2022-08-08 ENCOUNTER — Other Ambulatory Visit: Payer: Self-pay | Admitting: Nurse Practitioner

## 2022-08-08 DIAGNOSIS — R7309 Other abnormal glucose: Secondary | ICD-10-CM

## 2022-08-08 MED ORDER — SEMAGLUTIDE (1 MG/DOSE) 4 MG/3ML ~~LOC~~ SOPN: 1.0000 mg | PEN_INJECTOR | SUBCUTANEOUS | 3 refills | Status: DC

## 2022-09-14 ENCOUNTER — Ambulatory Visit (INDEPENDENT_AMBULATORY_CARE_PROVIDER_SITE_OTHER): Payer: BLUE CROSS/BLUE SHIELD | Admitting: Nurse Practitioner

## 2022-09-14 ENCOUNTER — Encounter: Payer: Self-pay | Admitting: Nurse Practitioner

## 2022-09-14 VITALS — BP 118/82 | HR 88 | Temp 97.5°F | Ht 59.75 in | Wt 228.2 lb

## 2022-09-14 DIAGNOSIS — Z87891 Personal history of nicotine dependence: Secondary | ICD-10-CM

## 2022-09-14 DIAGNOSIS — J3089 Other allergic rhinitis: Secondary | ICD-10-CM

## 2022-09-14 DIAGNOSIS — Z13220 Encounter for screening for lipoid disorders: Secondary | ICD-10-CM

## 2022-09-14 DIAGNOSIS — I10 Essential (primary) hypertension: Secondary | ICD-10-CM | POA: Diagnosis not present

## 2022-09-14 DIAGNOSIS — Z79899 Other long term (current) drug therapy: Secondary | ICD-10-CM

## 2022-09-14 DIAGNOSIS — E785 Hyperlipidemia, unspecified: Secondary | ICD-10-CM

## 2022-09-14 DIAGNOSIS — Z0001 Encounter for general adult medical examination with abnormal findings: Secondary | ICD-10-CM | POA: Diagnosis not present

## 2022-09-14 DIAGNOSIS — E559 Vitamin D deficiency, unspecified: Secondary | ICD-10-CM

## 2022-09-14 DIAGNOSIS — K589 Irritable bowel syndrome without diarrhea: Secondary | ICD-10-CM

## 2022-09-14 DIAGNOSIS — R6889 Other general symptoms and signs: Secondary | ICD-10-CM | POA: Diagnosis not present

## 2022-09-14 DIAGNOSIS — M1990 Unspecified osteoarthritis, unspecified site: Secondary | ICD-10-CM

## 2022-09-14 DIAGNOSIS — R7309 Other abnormal glucose: Secondary | ICD-10-CM | POA: Diagnosis not present

## 2022-09-14 DIAGNOSIS — K2100 Gastro-esophageal reflux disease with esophagitis, without bleeding: Secondary | ICD-10-CM

## 2022-09-14 DIAGNOSIS — N951 Menopausal and female climacteric states: Secondary | ICD-10-CM

## 2022-09-14 DIAGNOSIS — Z9884 Bariatric surgery status: Secondary | ICD-10-CM

## 2022-09-14 MED ORDER — PANTOPRAZOLE SODIUM 40 MG PO TBEC: 40.0000 mg | DELAYED_RELEASE_TABLET | Freq: Every day | ORAL | 3 refills | Status: DC

## 2022-09-14 NOTE — Patient Instructions (Signed)

## 2022-09-14 NOTE — Progress Notes (Signed)
Medicare Annual Wellness visit    Assessment / Plan:    Encounter for medicare annual wellness visit Yearly  Essential hypertension Continue current medications:HCTZ 12.'5mg'$  daily in am, zestril '20mg'$   Monitor blood pressure at home; call if consistently over 130/80 Continue DASH diet.   Reminder to go to the ER if any CP, SOB, nausea, dizziness, severe HA, changes vision/speech, left arm numbness and tingling and jaw pain. - Check CBC   Abnormal glucose Continue Ozempic '1mg'$  SQ QW Discussed general issues about diabetes pathophysiology and management., Educational material distributed., Suggested low cholesterol diet., Encouraged aerobic exercise., Discussed foot care., Reminded to get yearly retinal exam. - CMP   Hyperlipidemia -continue diet and exercise, weight loss - Check lipid panel - Check CMP   Morbid obesity - follow up 3 months for progress monitoring - increase veggies, decrease carbs - long discussion about weight loss, diet, and exercise - CONTINUE WEIGHT LOSS, WEIGHT LOSS GOAL IS 180   LAP-BAND surgery status Follow up Dr. Hassell Done   Asthma, unspecified asthma severity, uncomplicated Asthma controlled   IBS (irritable bowel syndrome) Diet controlled   Gastroesophageal reflux disease with esophagitis Has noticed more bloating and burping Change to Protonix and monitor, if persists will refer to GI- has a lapband  Osteoarthritis, unspecified osteoarthritis type, unspecified site Managing well at this time  Non-seasonal allergic rhinitis, unspecified trigger Managing well at this time Uses Clairatin OTC PRN  Vitamin D deficiency Continue supplementation to maintain goal of 70-100 Taking Vitamin D 5,000 IU daily Defer vitamin D level  Former smoker Monitor  Medication management - TSH   Menopausal vasomotor symptoms Taking Effexor '75mg'$    Discussed med's effects and SE's. Screening labs and tests as requested with regular follow-up as  recommended.  Future Appointments  Date Time Provider [place]  11/20/2022  9:30 AM [name], MD [facility] None  08/10/2023  1:30 PM Brendolyn Patty, MD [facility] None  09/15/2023 11:00 AM Alycia Rossetti, NP [facility] None    Further disposition pending results if labs check today. Discussed med's effects and SE's.   Over 40 minutes of face to face interview, exam, counseling, chart review, and critical decision making was performed.   Plan:   During the course of the visit the patient was educated and counseled about appropriate screening and preventive services including:   Pneumococcal vaccine  Prevnar 13 Influenza vaccine Td vaccine Screening electrocardiogram Bone densitometry screening Colorectal cancer screening Diabetes screening Glaucoma screening Nutrition counseling  Advanced directives: requested   HPI Maureen [name]  presents for a complete physical and follow up on HTN, HLD Weight, viamin D defciency and abnormal glucose.  Pt has retired and no longer needs to go to [employer] for labs    BMI is Body mass index is 44.94 kg/m., she is working on diet and exercise. She is She states she is walking better and doing better.  Wt Readings from Last 3 Encounters:  09/14/22 228 lb 3.2 oz (103.5 kg)  05/01/22 229 lb 3.2 oz (104 kg)  03/31/22 231 lb 3.2 oz (104.9 kg)   Her blood pressure has been controlled at home, runs 120's at home, on lisinopril 40 and HCTZ 12.'5mg'$  today their BP is BP: 118/82.  BP Readings from Last 3 Encounters:  09/14/22 118/82  07/28/22 (!) 147/83  05/01/22 120/78   She had her She denies chest pain, shortness of breath, dizziness.   Her cholesterol is diet controlled. Her cholesterol is controlled. She is NOT fasting for  labs today. The cholesterol last visit was:  Lab Results  Component Value Date   CHOL 197 03/31/2022   HDL 63 03/31/2022   LDLCALC 93 03/31/2022   TRIG 308 (H) 03/31/2022   CHOLHDL 3.1 03/31/2022   She  has been working on diet and exercise for prediabetes, and denies blurry vision, polydipsia, polyphagia and polyuria. Last A1C in the office was: Lab Results  Component Value Date   HGBA1C 5.2 03/31/2022   Patient is on Vitamin D supplements.  Lab Results  Component Value Date   VD25OH 46 03/31/2022   She is on prilosec for GERD. More burping/bloating after eating and drinking . She is currently on omeprazole but not relieving symptoms.  She is on effexor for IBS/diarrhea, she feels that it is not helping but would like to stay on it to prevent hot flashes. The diarrhea is more controlled since using the Ozempic.      Current Medications:  Current Outpatient Medications on File Prior to Visit  Medication Sig Dispense Refill   Cholecalciferol (VITAMIN D-3) 5000 units TABS Take 5,000 Units by mouth daily.     ELDERBERRY PO Take by mouth.     fluticasone (FLONASE) 50 MCG/ACT nasal spray Place 1-2 sprays into both nostrils at bedtime. 48 g 3   hydrochlorothiazide (HYDRODIURIL) 12.5 MG tablet TAKE 1 TABLET BY MOUTH EVERY DAY FOR BLOOD PRESSURE AND FLUID. 90 tablet 1   lisinopril (ZESTRIL) 20 MG tablet Take 1 tablet (20 mg total) by mouth daily. 30 tablet 11   loratadine (CLARITIN) 10 MG tablet Take 10 mg by mouth every morning.      MAGNESIUM PO Take by mouth.     Multiple Vitamin (MULTIVITAMIN) capsule Take 1 capsule by mouth daily.      OVER THE COUNTER MEDICATION Balance of nature     Semaglutide, 1 MG/DOSE, 4 MG/3ML SOPN Inject 1 mg into the skin once a week. 9 mL 3   venlafaxine XR (EFFEXOR-XR) Maureen MG 24 hr capsule TAKE 1 CAPSULE BY MOUTH ONCE DAILY FOR MOOD. 90 capsule 1   Zinc 15 MG CAPS Take by mouth.     No current facility-administered medications on file prior to visit.   Health Maintenance:  Immunization History  Administered Date(s) Administered   Fluad Quad(high Dose 65+) 05/08/2020   Influenza Inj Mdck Quad With Preservative 06/27/2018   Influenza, High Dose Seasonal  PF 06/17/2022   Influenza,inj,Quad PF,6+ Mos 06/06/2017, 05/19/2019   Influenza-Unspecified 07/11/2013, 06/23/2017, 06/30/2021   PFIZER(Purple Top)SARS-COV-2 Vaccination 11/01/2019, 11/22/2019, 06/29/2020   Respiratory Syncytial Virus Vaccine,Recomb Aduvanted(Arexvy) 06/17/2022   Td 04/28/2006   Td (Adult), 2 Lf Tetanus Toxid, Preservative Free 04/28/2006   Tdap 12/04/2015   Health Maintenance  Topic Date Due   COVID-19 Vaccine (4 - 2023-24 season) 09/30/2022 (Originally 05/01/2022)   Zoster Vaccines- Shingrix (1 of 2) 12/14/2022 (Originally 11/06/1974)   Hepatitis C Screening  09/15/2023 (Originally 11/06/1973)   MAMMOGRAM  01/31/2024   DTaP/Tdap/Td (4 - Td or Tdap) 12/03/2025   COLONOSCOPY (Pts 45-38yr Insurance coverage will need to be confirmed)  06/18/2031   Pneumonia Vaccine 67 Years old  Completed   INFLUENZA VACCINE  Completed   DEXA SCAN  Completed   HPV VACCINES  Aged Out    DEE: 2022 Dr. JGlennon MacDentist: [place],08/17/22 [facility]erm Dr[facility]icole [facility]  Medical History:  Past Medical History:  Diagnosis Date   Arthritis    LEFT KNEE, LOWER BACK   GERD (gastroesophageal  reflux disease)    History of hiatal hernia    History of obstructive sleep apnea    per pt history osa w/ cpap until wt loss after gastric band, retested negative for sleep apnea   Hypertension    IBS (irritable bowel syndrome)    Numbness    BOTTOM LEFT FOOT   PONV (postoperative nausea and vomiting)    PONV with gallbladder    Seasonal allergies    Sinus drainage    Snores    WEARS NIGHT GUARD   SUI (stress urinary incontinence, [name])    Allergies Allergies  Allergen Reactions   Mucinex [Guaifenesin Er] Other (See Comments)    "feels weird and dizzy"   Tramadol Itching    SURGICAL HISTORY She  has a past surgical history that includes Laparoscopic gastric banding (11/05/2009   dr Hassell Done); Laparoscopic gastric banding with hiatal hernia repair (N/A, 01/22/2015);  ORIF toe fracture (Left); Colonoscopy; Esophagogastroduodenoscopy; Total knee arthroplasty (Right, 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 FAMILY HISTORY Her family history includes Cancer in her paternal grandfather; Heart disease in her father; Hyperlipidemia in her father. SOCIAL HISTORY She  reports that she quit smoking about 24 years ago. Her smoking use included cigarettes. She has a 7.00 pack-year smoking history. She has never used smokeless tobacco. She reports that she does not drink alcohol and does not use drugs. MEDICARE WELLNESS OBJECTIVES: Physical activity: Current Exercise Habits: Home exercise routine, Type of exercise: walking, Time (Minutes): 30, Frequency (Times/Week): 2, Weekly Exercise (Minutes/Week): 60, Intensity: Mild, Exercise limited by: None identified Cardiac risk factors: Cardiac Risk Factors include: advanced age (>40mn, >>64women);dyslipidemia;hypertension;obesity (BMI >30kg/m2);sedentary lifestyle Depression/mood screen:      09/14/2022   11:11 AM  Depression screen PHQ 2/9  Decreased Interest 0  Down, Depressed, Hopeless 0  PHQ - 2 Score 0    ADLs:     09/14/2022   11:11 AM  In your present state of health, do you have any difficulty performing the following activities:  Hearing? 0  Vision? 0  Difficulty concentrating or making decisions? 0  Walking or climbing stairs? 0  Dressing or bathing? 0  Doing errands, shopping? 0     Cognitive Testing  Alert? Yes  Normal Appearance?Yes  Oriented to person? Yes  Place? Yes   Time? Yes  Recall of three objects?  Yes  Can perform simple  calculations? Yes  Displays appropriate judgment?Yes  Can read the correct time from a watch face?Yes  EOL planning: Does Patient Have a Medical Advance Directive?: Yes Type of Advance Directive: Healthcare Power of Attorney, Living will Does patient want to make changes to medical advance directive?: No - Patient declined Copy of [place] Chart?: No - copy requested    Review of Systems  Constitutional:  Negative for chills, fever and weight loss.  HENT:  Negative for congestion and hearing loss.   Eyes:  Negative for blurred vision and double vision.  Respiratory:  Negative for cough and shortness of breath.   Cardiovascular:  Negative for chest pain, palpitations, orthopnea and leg swelling.  Gastrointestinal:  Negative for abdominal pain, constipation, diarrhea, heartburn, nausea and vomiting.       Burping and bloating  after eating  Musculoskeletal:  Negative for falls, joint pain and myalgias.  Skin:  Negative for rash.  Neurological:  Negative for dizziness, tingling, tremors, loss of consciousness and headaches.  Psychiatric/Behavioral:  Negative for depression, memory loss and suicidal ideas.     Physical Exam: Blood pressure 118/82, pulse 88, temperature (!) 97.5 F (36.4 C), height 4' 11.Maureen" (1.518 m), weight 228 lb 3.2 oz (103.5 kg), SpO2 95 %. Body mass index is 44.94 kg/m. General Appearance: Well nourished, in no apparent distress. Eyes: PERRLA, EOMs, conjunctiva no swelling or erythema, normal fundi and vessels. Sinuses: No Frontal/maxillary tenderness ENT/Mouth: Ext aud canals clear, normal light reflex with TMs without erythema, bulging.  Good dentition. No erythema, swelling, or exudate on post pharynx. Tonsils not swollen or erythematous. Hearing normal.  Neck: Supple, thyroid normal. No bruits Respiratory: Respiratory effort normal, BS equal bilaterally without rales, rhonchi, wheezing or stridor. Cardio: RRR without murmurs, rubs or  gallops. Brisk peripheral pulses without edema.  Chest: symmetric, with normal excursions and percussion. Breasts: defer Abdomen: Soft, +BS, obese, notenderness, no guarding, rebound, hernias, masses, or organomegaly. .  Lymphatics: Non tender without lymphadenopathy.  Genitourinary: defer Musculoskeletal: Full ROM all peripheral extremities,5/5 strength Neuro: Cranial nerves intact, reflexes equal bilaterally. Normal muscle tone, no cerebellar symptoms. Sensation intact.  Psych: Awake and oriented X 3, normal affect, Insight and Judgment appropriate.       Medicare Attestation I have personally reviewed: [name] medical and social history Their use of alcohol, tobacco or illicit drugs Their current medications and supplements [name] functional ability including ADLs,fall risks, home safety risks, cognitive, and hearing and visual impairment Diet and physical activities Evidence for depression or mood disorders  [name] weight, height, BMI, and visual acuity have been recorded in the chart.  I have made referrals, counseling, and provided education to the patient based on review of the above and I have provided the patient with a written personalized care plan for preventive services.      Magda Bernheim ANP-C  [facility]  09/14/2022

## 2022-09-15 LAB — CBC WITH DIFFERENTIAL/PLATELET
Absolute Monocytes: 330 cells/uL (ref 200–950)
Basophils Absolute: 28 cells/uL (ref 0–200)
Basophils Relative: 0.5 %
Eosinophils Absolute: 88 cells/uL (ref 15–500)
Eosinophils Relative: 1.6 %
HCT: 40.5 % (ref 35.0–45.0)
Hemoglobin: 14 g/dL (ref 11.7–15.5)
Lymphs Abs: 1584 cells/uL (ref 850–3900)
MCH: 32.6 pg (ref 27.0–33.0)
MCHC: 34.6 g/dL (ref 32.0–36.0)
MCV: 94.2 fL (ref 80.0–100.0)
MPV: 10.3 fL (ref 7.5–12.5)
Monocytes Relative: 6 %
Neutro Abs: 3471 cells/uL (ref 1500–7800)
Neutrophils Relative %: 63.1 %
Platelets: 281 10*3/uL (ref 140–400)
RBC: 4.3 10*6/uL (ref 3.80–5.10)
RDW: 12.1 % (ref 11.0–15.0)
Total Lymphocyte: 28.8 %
WBC: 5.5 10*3/uL (ref 3.8–10.8)

## 2022-09-15 LAB — LIPID PANEL
Cholesterol: 187 mg/dL (ref <200)
HDL: 78 mg/dL (ref >=50)
LDL Cholesterol (Calc): 84 mg/dL (calc)
Non-HDL Cholesterol (Calc): 109 mg/dL (calc) (ref <130)
Total CHOL/HDL Ratio: 2.4 (calc) (ref <5.0)
Triglycerides: 145 mg/dL (ref <150)

## 2022-09-15 LAB — COMPLETE METABOLIC PANEL WITH GFR
AG Ratio: 1.7 (calc) (ref 1.0–2.5)
ALT: 23 U/L (ref 6–29)
AST: 20 U/L (ref 10–35)
Albumin: 4.3 g/dL (ref 3.6–5.1)
Alkaline phosphatase (APISO): 85 U/L (ref 37–153)
BUN: 16 mg/dL (ref 7–25)
CO2: 29 mmol/L (ref 20–32)
Calcium: 10.1 mg/dL (ref 8.6–10.4)
Chloride: 102 mmol/L (ref 98–110)
Creat: 0.65 mg/dL (ref 0.50–1.05)
Globulin: 2.5 g/dL (calc) (ref 1.9–3.7)
Glucose, Bld: 98 mg/dL (ref 65–99)
Potassium: 4.4 mmol/L (ref 3.5–5.3)
Sodium: 139 mmol/L (ref 135–146)
Total Bilirubin: 0.4 mg/dL (ref 0.2–1.2)
Total Protein: 6.8 g/dL (ref 6.1–8.1)
eGFR: 96 mL/min/{1.73_m2} (ref >=60)

## 2022-09-15 LAB — TSH: TSH: 1.85 mIU/L (ref 0.40–4.50)

## 2022-09-21 ENCOUNTER — Telehealth: Payer: BLUE CROSS/BLUE SHIELD | Admitting: Physician Assistant

## 2022-09-21 DIAGNOSIS — J019 Acute sinusitis, unspecified: Secondary | ICD-10-CM

## 2022-09-21 DIAGNOSIS — B9689 Other specified bacterial agents as the cause of diseases classified elsewhere: Secondary | ICD-10-CM

## 2022-09-21 MED ORDER — AMOXICILLIN-POT CLAVULANATE 875-125 MG PO TABS: 1.0000 | ORAL_TABLET | Freq: Two times a day (BID) | ORAL | 0 refills | Status: DC

## 2022-09-21 NOTE — Progress Notes (Signed)

## 2022-09-28 ENCOUNTER — Encounter: Payer: Self-pay | Admitting: Nurse Practitioner

## 2022-09-28 ENCOUNTER — Other Ambulatory Visit: Payer: Self-pay | Admitting: Nurse Practitioner

## 2022-09-28 DIAGNOSIS — B9689 Other specified bacterial agents as the cause of diseases classified elsewhere: Secondary | ICD-10-CM

## 2022-09-28 MED ORDER — AMOXICILLIN-POT CLAVULANATE 875-125 MG PO TABS: 1.0000 | ORAL_TABLET | Freq: Two times a day (BID) | ORAL | 0 refills | Status: DC

## 2022-10-12 ENCOUNTER — Other Ambulatory Visit: Payer: Self-pay | Admitting: Nurse Practitioner

## 2022-11-20 ENCOUNTER — Ambulatory Visit: Payer: Commercial Managed Care - PPO | Admitting: Obstetrics & Gynecology

## 2022-11-25 ENCOUNTER — Other Ambulatory Visit: Payer: Self-pay | Admitting: Nurse Practitioner

## 2022-11-25 ENCOUNTER — Encounter: Payer: Self-pay | Admitting: Nurse Practitioner

## 2022-11-25 DIAGNOSIS — K219 Gastro-esophageal reflux disease without esophagitis: Secondary | ICD-10-CM

## 2022-12-04 ENCOUNTER — Telehealth: Payer: Self-pay | Admitting: Nurse Practitioner

## 2022-12-04 ENCOUNTER — Encounter: Payer: Self-pay | Admitting: Nurse Practitioner

## 2022-12-04 NOTE — Telephone Encounter (Signed)
Please have her schedule an appointment so we can discuss

## 2022-12-04 NOTE — Telephone Encounter (Signed)
Pt went to [facility] for sinus related issues and they did an x-ray. She said they found 2 nodules in her lungs and recommended she called [place] to refer her for a CT Scan.

## 2022-12-10 NOTE — Progress Notes (Signed)
Assessment and Plan:  Lennyx was seen today for acute visit.  Diagnoses and all orders for this visit:  Pulmonary nodules/lesions, multiple Will get chest CT and treat pending results -     CT Chest Wo Contrast; Future  Essential hypertension - continue medications, DASH diet, exercise and monitor at home. Call if greater than 130/80.     Further disposition pending results of labs. Discussed med's effects and SE's.   Over 30 minutes of exam, counseling, chart review, and critical decision making was performed.   Future Appointments  Date Time Provider Department Center  01/21/2023  3:00 PM Genia Del, MD [facility] None  03/16/2023 10:00 AM Raynelle Dick, NP [facility] None  08/10/2023  1:30 PM Willeen Niece, MD [facility] None  09/15/2023 11:00 AM Raynelle Dick, NP [facility] None    ------------------------------------------------------------------------------------------------------------------   HPI BP 126/80   Pulse 82   Temp (!) 97.3 F (36.3 C)   Ht 4' 11.75" (1.518 m)   Wt 222 lb 12.8 oz (101.1 kg)   SpO2 98%   BMI 43.88 kg/m   67 y.o.female presents for previous evaluation at [facility], a chest xray was obtained at that facility which showed 5 mm nodular density lateral left upper lung and 9 mm nodular density in lateral left midlung field. She denies shortness of breath, not coughing up blood.  Smoked from age [age] approx a couple cigarettes a day  BP is well controlled on HCTZ 12.5 mg daily. Denies headaches, chest pain, shortness of breath and dizziness. BP Readings from Last 3 Encounters:  12/14/22 126/80  09/14/22 118/82  07/28/22 (!) 147/83     Past Medical History:  Diagnosis Date   Arthritis    LEFT KNEE, LOWER BACK   GERD (gastroesophageal reflux disease)    History of hiatal hernia    History of obstructive sleep apnea    per pt history osa w/ cpap until wt loss after gastric band, retested negative for sleep apnea   Hypertension     IBS (irritable bowel syndrome)    Numbness    BOTTOM LEFT FOOT   PONV (postoperative nausea and vomiting)    PONV with gallbladder    Seasonal allergies    Sinus drainage    Snores    WEARS NIGHT GUARD   SUI (stress urinary incontinence, female)      Allergies  Allergen Reactions   Mucinex [Guaifenesin Er] Other (See Comments)    "feels weird and dizzy"   Tramadol Itching    Current Outpatient Medications on File Prior to Visit  Medication Sig   Cholecalciferol (VITAMIN D-3) 5000 units TABS Take 5,000 Units by mouth daily.   ELDERBERRY PO Take by mouth.   famotidine (PEPCID) 40 MG tablet 1 tab(s) orally 2 times a day for 90 days   fluticasone (FLONASE) 50 MCG/ACT nasal spray Place 1-2 sprays into both nostrils at bedtime.   hydrochlorothiazide (HYDRODIURIL) 12.5 MG tablet TAKE 1 TABLET BY MOUTH DAILY FOR BLOOD PRESSURE AND FLUID   lisinopril (ZESTRIL) 20 MG tablet Take 1 tablet (20 mg total) by mouth daily.   loratadine (CLARITIN) 10 MG tablet Take 10 mg by mouth every morning.    MAGNESIUM PO Take by mouth.   Multiple Vitamin (MULTIVITAMIN) capsule Take 1 capsule by mouth daily.    OVER THE COUNTER MEDICATION Balance of nature   pantoprazole (PROTONIX) 40 MG tablet Take 1 tablet (40 mg total) by mouth daily.   Semaglutide, 1 MG/DOSE, 4 MG/3ML SOPN  Inject 1 mg into the skin once a week.   venlafaxine XR (EFFEXOR-XR) 75 MG 24 hr capsule TAKE 1 CAPSULE BY MOUTH ONCE DAILY FOR MOOD.   Zinc 15 MG CAPS Take by mouth.   amoxicillin-clavulanate (AUGMENTIN) 875-125 MG tablet Take 1 tablet by mouth 2 (two) times daily. (Patient not taking: Reported on 12/14/2022)   No current facility-administered medications on file prior to visit.    ROS: all negative except above.   Physical Exam:  BP 126/80   Pulse 82   Temp (!) 97.3 F (36.3 C)   Ht 4' 11.75" (1.518 m)   Wt 222 lb 12.8 oz (101.1 kg)   SpO2 98%   BMI 43.88 kg/m   General Appearance: Well nourished, in no apparent  distress. Eyes: PERRLA, EOMs, conjunctiva no swelling or erythema Neck: Supple, thyroid normal.  Respiratory: Respiratory effort normal, BS equal bilaterally without rales, rhonchi, wheezing or stridor.  Cardio: RRR with no MRGs. Brisk peripheral pulses without edema.  Abdomen: Soft, + BS.  Non tender, no guarding, rebound, hernias, masses. Lymphatics: Non tender without lymphadenopathy.  Musculoskeletal: Full ROM, 5/5 strength, normal gait.  Skin: Warm, dry without rashes, lesions, ecchymosis.  Neuro: Cranial nerves intact. Normal muscle tone, no cerebellar symptoms. Sensation intact.  Psych: Awake and oriented X 3, normal affect, Insight and Judgment appropriate.     Raynelle DickANA E , NP 10:50 AM [facility]

## 2022-12-14 ENCOUNTER — Ambulatory Visit: Payer: BLUE CROSS/BLUE SHIELD | Admitting: Nurse Practitioner

## 2022-12-14 ENCOUNTER — Encounter: Payer: Self-pay | Admitting: Nurse Practitioner

## 2022-12-14 VITALS — BP 126/80 | HR 82 | Temp 97.3°F | Ht 59.75 in | Wt 222.8 lb

## 2022-12-14 DIAGNOSIS — R918 Other nonspecific abnormal finding of lung field: Secondary | ICD-10-CM | POA: Diagnosis not present

## 2022-12-14 DIAGNOSIS — I10 Essential (primary) hypertension: Secondary | ICD-10-CM | POA: Diagnosis not present

## 2022-12-14 NOTE — Patient Instructions (Signed)
Will call you with appointment for chest CT  Pulmonary Nodule  A pulmonary nodule is a small, round growth of tissue in the lung. It is sometimes referred to as a shadow or a spot on the lung. Nodules can vary in size, and most measure less than  of an inch (10 mm). Nodules bigger than 1.2 inches (3 cm) are called lung masses. A pulmonary nodule is sometimes found during a routine chest X-ray or while other imaging tests are done to check for other problems. Pulmonary nodules can be either noncancerous (benign) or cancerous (malignant). Most are noncancerous. Smaller nodules in people who do not smoke and who do not have any other risk factors for lung cancer are more likely to be noncancerous. Larger, irregular nodules in people who smoke or who have a strong family history of lung cancer are more likely to be cancerous. What are the causes? This condition may be caused by: A bacterial, fungal, or viral infection, such as tuberculosis. The infection is usually an old and inactive one. Cancerous tissue, such as lung cancer or a cancer in another part of the body that has spread to the lung. A noncancerous mass of tissue. Inflammation from conditions such as rheumatoid arthritis. Abnormal blood vessels in the lungs. What are the signs or symptoms? Usually, there are no symptoms of this condition. If symptoms appear, they are usually related to the underlying cause. For example, if the condition is caused by an infection, you may have a cough or a fever. How is this diagnosed? This condition is usually diagnosed with an X-ray or CT scan. To help determine whether a pulmonary nodule is benign or malignant, your health care provider will: Take your medical history. Perform a physical exam. Order tests. These may include: Chest X-rays. A CT scan. This test shows smaller pulmonary nodules more clearly and with more detail than an X-ray. A positron emission tomography (PET) scan. This test is done to  check if a nodule is cancerous. During the test, a small amount of a radioactive substance is injected into the bloodstream. Then a picture is taken. Biopsy to evaluate for cancer or confirm a diagnosis. This procedure involves removing a tissue sample from the nodule by inserting a needle through the chest, placing a scope down into the lung, or doing open surgery. A skin test called a tuberculin test. This test is done to check if you have been exposed to the germ that causes tuberculosis. Blood tests. How is this treated? Treatment for this condition depends on whether the pulmonary nodule is malignant or benign. It also depends on your risk of getting cancer. Noncancerous nodules usually do not need to be treated, but they may need to be monitored with CT scans. If a CT scan shows that the pulmonary nodule got bigger, more tests may be done. Nodules that are found to be cancerous after a biopsy require treatment depending on the type and stage of the cancer. You will need more diagnostic tests, such as CT and PET scans, to determine the stage of the cancer. Treatments for cancer can include: Surgery. Radiation therapy. Chemotherapy. Immunotherapy. Some nodules need to be removed. If you need a nodule removed, you may have a procedure called a thoracotomy. During the procedure, your health care provider will make an incision in your chest and remove the part of the lung where the nodule is located. Follow these instructions at home: Take over-the-counter and prescription medicines only as told by your health  care provider. Do not use any products that contain nicotine or tobacco. These products include cigarettes, chewing tobacco, and vaping devices, such as e-cigarettes. If you need help quitting, ask your health care provider. Keep all follow-up visits. This is important. Contact a health care provider if: You have pain in your chest, back, or shoulder. You are short of breath or have trouble  breathing when you are active. You develop a cough, or you develop hoarseness for an unexplained reason. You feel sick or unusually tired. You do not feel like eating or you lose weight without trying. You develop chills or night sweats. You need two or more pillows to sleep on at night. You have any of these problems: A fever and symptoms that suddenly get worse. A fever or persistent symptoms for more than 2-3 days. Get help right away if: You cannot catch your breath. You have sudden chest pain. You start making high-pitched whistling sounds when you breathe, most often when you breathe out (you wheeze). You cannot stop coughing, or you cough up blood or bloody mucus from your lungs (sputum). You become dizzy or feel like you may faint. These symptoms may represent a serious problem that is an emergency. Do not wait to see if the symptoms will go away. Get medical help right away. Call your local emergency services (911 in the [place] Do not drive yourself to the hospital. Summary A pulmonary nodule is a small, round growth of tissue in the lung. Most pulmonary nodules are noncancerous. Common causes of pulmonary nodules include infection, inflammation, and noncancerous growths. This condition is usually diagnosed with an X-ray or CT scan. Treatment for this condition depends on whether the pulmonary nodule is malignant or benign. It also depends on your risk of getting cancer. If a nodule is found to be cancerous, you will need specific diagnostic tests and treatment options as told by your health care provider. This information is not intended to replace advice given to you by your health care provider. Make sure you discuss any questions you have with your health care provider. Document Revised: 03/06/2020 Document Reviewed: 03/06/2020 Elsevier Patient Education  2023 [employer].

## 2022-12-22 ENCOUNTER — Ambulatory Visit
Admission: RE | Admit: 2022-12-22 | Discharge: 2022-12-22 | Disposition: A | Discharge status: Home / Self Care | Payer: BLUE CROSS/BLUE SHIELD | Source: Ambulatory Visit | Attending: Nurse Practitioner | Admitting: Nurse Practitioner

## 2022-12-22 DIAGNOSIS — R918 Other nonspecific abnormal finding of lung field: Secondary | ICD-10-CM | POA: Diagnosis not present

## 2022-12-25 ENCOUNTER — Telehealth: Payer: Self-pay | Admitting: Nurse Practitioner

## 2022-12-25 NOTE — Telephone Encounter (Signed)
She can try to call the imaging center to see if it has been read

## 2022-12-25 NOTE — Telephone Encounter (Signed)
Pt called bc she says she hasn't gotten word about her CT Scan done earlier this week

## 2022-12-25 NOTE — Telephone Encounter (Signed)
I have not yet gotten the results.  I looked today and they are still not available

## 2022-12-27 ENCOUNTER — Encounter: Payer: Self-pay | Admitting: Nurse Practitioner

## 2022-12-27 DIAGNOSIS — I700 Atherosclerosis of aorta: Secondary | ICD-10-CM | POA: Insufficient documentation

## 2023-01-11 ENCOUNTER — Other Ambulatory Visit: Payer: Self-pay | Admitting: Nurse Practitioner

## 2023-01-21 ENCOUNTER — Ambulatory Visit: Payer: BLUE CROSS/BLUE SHIELD | Admitting: Obstetrics & Gynecology

## 2023-01-21 ENCOUNTER — Encounter: Payer: Self-pay | Admitting: Obstetrics & Gynecology

## 2023-01-21 VITALS — BP 120/82 | HR 96 | Ht 59.75 in | Wt 227.0 lb

## 2023-01-21 DIAGNOSIS — M85852 Other specified disorders of bone density and structure, left thigh: Secondary | ICD-10-CM

## 2023-01-21 DIAGNOSIS — Z01419 Encounter for gynecological examination (general) (routine) without abnormal findings: Secondary | ICD-10-CM

## 2023-01-21 DIAGNOSIS — M545 Low back pain, unspecified: Secondary | ICD-10-CM

## 2023-01-21 DIAGNOSIS — Z90710 Acquired absence of both cervix and uterus: Secondary | ICD-10-CM | POA: Diagnosis not present

## 2023-01-21 DIAGNOSIS — Z780 Asymptomatic menopausal state: Secondary | ICD-10-CM

## 2023-01-21 LAB — URINALYSIS, COMPLETE W/RFL CULTURE
Protein, ur: NEGATIVE
RBC / HPF: NONE SEEN /HPF (ref 0–2)
Specific Gravity, Urine: 1.02 (ref 1.001–1.035)
pH: 5.5 (ref 5.0–8.0)

## 2023-01-21 NOTE — Progress Notes (Signed)
Maureen [name] 1956/02/09 [id number]   History:    67 y.o. G3P1A2L1 Married.  Son is 56 yo, doing well.   RP: Established patient presenting for annual gyn exam    HPI: S/P Total Hysterectomy.  Postmenopause, well on no hormone replacement therapy. No pelvic pain.  Abstinent. Pap Neg 10/2018.  No indication for a Pap test at this time. Breasts normal. Mammo Neg 01/2022.  Repeat Mammo in 01/2023.  Body mass index 44.7. Health labs with family physician.  BMD Osteopenia Lt Femoral Neck T-Score -1.2 at Lt Fem Neck, other sites normal, on 11-21-19, will repeat at 4 yrs. COLONOSCOPY: 10/2018   Past medical history,surgical history, family history and social history were all reviewed and documented in the EPIC chart.  Gynecologic History No LMP recorded. Patient has had a hysterectomy.  Obstetric History OB History  Gravida Para Term Preterm AB Living  3       2 1   SAB IAB Ectopic Multiple Live Births  2       1    # Outcome Date GA Lbr Len/2nd Weight Sex Delivery Anes PTL Lv  3 Gravida           2 SAB           1 SAB              ROS: A ROS was performed and pertinent positives and negatives are included in the history. GENERAL: No fevers or chills. HEENT: No change in vision, no earache, sore throat or sinus congestion. NECK: No pain or stiffness. CARDIOVASCULAR: No chest pain or pressure. No palpitations. PULMONARY: No shortness of breath, cough or wheeze. GASTROINTESTINAL: No abdominal pain, nausea, vomiting or diarrhea, melena or bright red blood per rectum. GENITOURINARY: No urinary frequency, urgency, hesitancy or dysuria. MUSCULOSKELETAL: No joint or muscle pain, no back pain, no recent trauma. DERMATOLOGIC: No rash, no itching, no lesions. ENDOCRINE: No polyuria, polydipsia, no heat or cold intolerance. No recent change in weight. HEMATOLOGICAL: No anemia or easy bruising or bleeding. NEUROLOGIC: No headache, seizures, numbness, tingling or weakness. PSYCHIATRIC: No depression, no loss  of interest in normal activity or change in sleep pattern.     Exam:   BP 120/82   Pulse 96   Ht 4' 11.75" (1.518 m)   Wt 227 lb (103 kg)   SpO2 99%   BMI 44.70 kg/m   Body mass index is 44.7 kg/m.  General appearance : Well developed well nourished female. No acute distress HEENT: Eyes: no retinal hemorrhage or exudates,  Neck supple, trachea midline, no carotid bruits, no thyroidmegaly Lungs: Clear to auscultation, no rhonchi or wheezes, or rib retractions  Heart: Regular rate and rhythm, no murmurs or gallops Breast:Examined in sitting and supine position were symmetrical in appearance, no palpable masses or tenderness,  no skin retraction, no nipple inversion, no nipple discharge, no skin discoloration, no axillary or supraclavicular lymphadenopathy Abdomen: no palpable masses or tenderness, no rebound or guarding Extremities: no edema or skin discoloration or tenderness  Pelvic: Vulva: Normal             Vagina: No gross lesions or discharge  Cervix/Uterus absent  Adnexa  Without masses or tenderness  Anus: Normal  U/A: Yellow clear, Pro Neg, Nit Neg, WBC 0-5, RBC Neg, Bacteria Neg.  Urine Culture pending.   Assessment/Plan:  67 y.o. female for annual exam   1. Well female exam with routine gynecological exam S/P Total Hysterectomy.  Postmenopause, well on  no hormone replacement therapy. No pelvic pain.  Abstinent. Pap Neg 10/2018.  No indication for a Pap test at this time. Breasts normal. Mammo Neg 01/2022.  Repeat Mammo in 01/2023.  Body mass index 44.7. Health labs with family physician.  BMD Osteopenia Lt Femoral Neck T-Score -1.2 at Lt Fem Neck, other sites normal, on 11-21-19, will repeat at 4 yrs. COLONOSCOPY: 10/2018.  2. S/P laparoscopic assisted vaginal hysterectomy (LAVH)  3. Postmenopausal S/P Total Hysterectomy.  Postmenopause, well on no hormone replacement therapy. No pelvic pain.  Abstinent.   4. Osteopenia of neck of left femur BMD Osteopenia Lt Femoral  Neck T-Score -1.2 at Lt Fem Neck, other sites normal, on 11-21-19, will repeat at 4 yrs.   5. Acute bilateral low back pain without sciatica U/A wnl. - Urinalysis,Complete w/RFL Culture  Other orders - Urine Culture - REFLEXIVE URINE CULTURE   Genia Del MD, 3:11 PM

## 2023-01-22 LAB — URINALYSIS, COMPLETE W/RFL CULTURE
Bacteria, UA: NONE SEEN /HPF
Hgb urine dipstick: NEGATIVE
Hyaline Cast: NONE SEEN /LPF

## 2023-01-23 ENCOUNTER — Other Ambulatory Visit: Payer: Self-pay | Admitting: Nurse Practitioner

## 2023-01-23 LAB — URINE CULTURE
MICRO NUMBER:: 14995564
SPECIMEN QUALITY:: ADEQUATE

## 2023-01-23 LAB — URINALYSIS, COMPLETE W/RFL CULTURE
Bilirubin Urine: NEGATIVE
Glucose, UA: NEGATIVE
Nitrites, Initial: NEGATIVE

## 2023-01-23 LAB — CULTURE INDICATED

## 2023-02-02 ENCOUNTER — Ambulatory Visit: Payer: BLUE CROSS/BLUE SHIELD | Admitting: Nurse Practitioner

## 2023-02-04 LAB — HM MAMMOGRAPHY

## 2023-02-05 ENCOUNTER — Encounter: Payer: Self-pay | Admitting: Obstetrics & Gynecology

## 2023-02-08 ENCOUNTER — Encounter: Payer: Self-pay | Admitting: Internal Medicine

## 2023-02-17 NOTE — Progress Notes (Unsigned)
Assessment and Plan:  There are no diagnoses linked to this encounter.    Further disposition pending results of labs. Discussed med's effects and SE's.   Over 30 minutes of exam, counseling, chart review, and critical decision making was performed.   Future Appointments  Date Time Provider Department Center  02/18/2023  1:45 PM Raynelle Dick, NP [facility] None  03/16/2023 10:00 AM Raynelle Dick, NP [facility] None  08/10/2023  1:30 PM Willeen Niece, MD [facility] None  09/15/2023 11:00 AM Raynelle Dick, NP [facility] None    ------------------------------------------------------------------------------------------------------------------   HPI There were no vitals taken for this visit. 67 y.o.female presents for  Past Medical History:  Diagnosis Date   Arthritis    LEFT KNEE, LOWER BACK   GERD (gastroesophageal reflux disease)    History of hiatal hernia    History of obstructive sleep apnea    per pt history osa w/ cpap until wt loss after gastric band, retested negative for sleep apnea   Hypertension    IBS (irritable bowel syndrome)    Numbness    BOTTOM LEFT FOOT   PONV (postoperative nausea and vomiting)    PONV with gallbladder    Seasonal allergies    Sinus drainage    Snores    WEARS NIGHT GUARD   SUI (stress urinary incontinence, female)      Allergies  Allergen Reactions   Mucinex [Guaifenesin Er] Other (See Comments)    "feels weird and dizzy"   Tramadol Itching    Current Outpatient Medications on File Prior to Visit  Medication Sig   Cholecalciferol (VITAMIN D-3) 5000 units TABS Take 5,000 Units by mouth daily.   ELDERBERRY PO Take by mouth.   famotidine (PEPCID) 40 MG tablet 1 tab(s) orally 2 times a day for 90 days   fluticasone (FLONASE) 50 MCG/ACT nasal spray Place 1-2 sprays into both nostrils at bedtime.   hydrochlorothiazide (HYDRODIURIL) 12.5 MG tablet TAKE 1 TABLET BY MOUTH DAILY FOR BLOOD PRESSURE AND FLUID   lisinopril  (ZESTRIL) 20 MG tablet Take 1 tablet (20 mg total) by mouth daily. (Patient taking differently: Take 10 mg by mouth daily.)   loratadine (CLARITIN) 10 MG tablet Take 10 mg by mouth every morning.    MAGNESIUM PO Take by mouth.   Multiple Vitamin (MULTIVITAMIN) capsule Take 1 capsule by mouth daily.    OVER THE COUNTER MEDICATION Balance of nature   pantoprazole (PROTONIX) 40 MG tablet TAKE ONE TABLET (40 MG) BY MOUTH EVERY DAY   Semaglutide, 1 MG/DOSE, 4 MG/3ML SOPN Inject 1 mg into the skin once a week.   venlafaxine XR (EFFEXOR-XR) 75 MG 24 hr capsule Take  1 capsule  Daily for Mood                                                    /                                        TAKE  BY                                                   MOUTH                                                ONCE  ? ? ?  DAILY   Zinc 15 MG CAPS Take by mouth.   No current facility-administered medications on file prior to visit.    ROS: all negative except above.   Physical Exam:  There were no vitals taken for this visit.  General Appearance: Well nourished, in no apparent distress. Eyes: PERRLA, EOMs, conjunctiva no swelling or erythema Sinuses: No Frontal/maxillary tenderness ENT/Mouth: Ext aud canals clear, TMs without erythema, bulging. No erythema, swelling, or exudate on post pharynx.  Tonsils not swollen or erythematous. Hearing normal.  Neck: Supple, thyroid normal.  Respiratory: Respiratory effort normal, BS equal bilaterally without rales, rhonchi, wheezing or stridor.  Cardio: RRR with no MRGs. Brisk peripheral pulses without edema.  Abdomen: Soft, + BS.  Non tender, no guarding, rebound, hernias, masses. Lymphatics: Non tender without lymphadenopathy.  Musculoskeletal: Full ROM, 5/5 strength, normal gait.  Skin: Warm, dry without rashes, lesions, ecchymosis.  Neuro: Cranial nerves intact. Normal muscle tone, no cerebellar symptoms. Sensation intact.   Psych: Awake and oriented X 3, normal affect, Insight and Judgment appropriate.     Raynelle Dick, NP 1:44 PM [facility]

## 2023-02-18 ENCOUNTER — Ambulatory Visit: Payer: BLUE CROSS/BLUE SHIELD | Admitting: Nurse Practitioner

## 2023-02-18 ENCOUNTER — Encounter: Payer: Self-pay | Admitting: Nurse Practitioner

## 2023-02-18 VITALS — BP 128/78 | HR 101 | Temp 97.5°F | Ht 59.75 in | Wt 223.6 lb

## 2023-02-18 DIAGNOSIS — M62830 Muscle spasm of back: Secondary | ICD-10-CM | POA: Diagnosis not present

## 2023-02-18 DIAGNOSIS — I10 Essential (primary) hypertension: Secondary | ICD-10-CM | POA: Diagnosis not present

## 2023-02-18 MED ORDER — DEXAMETHASONE 4 MG PO TABS
ORAL_TABLET | ORAL | 0 refills | Status: DC
Start: 2023-02-18 — End: 2023-03-16

## 2023-02-18 MED ORDER — CELECOXIB 100 MG PO CAPS
100.0000 mg | ORAL_CAPSULE | Freq: Two times a day (BID) | ORAL | 0 refills | Status: AC
Start: 2023-02-18 — End: 2023-02-28

## 2023-02-18 NOTE — Patient Instructions (Signed)
Muscle Cramps and Spasms Muscle cramps and spasms occur when a muscle or muscles tighten and you have no control over this tightening (involuntary muscle contraction). They are a common problem that can happen in any muscle. The most common place is in the calf muscles of the leg. There are a few ways that muscle cramps and spasms differ: Muscle cramps are painful. They come and go and may last for a few seconds or up to 15 minutes. Muscle cramps are often more forceful and last longer than muscle spasms. Muscle spasms may or may not be painful. They may last just a few seconds or last much longer. Certain conditions, such as diabetes or Parkinson's disease, can make you more likely to have cramps or spasms. But in most cases, cramps and spasms are not caused by other conditions. Common causes include: Overexertion. This is when you do more physical work or exercise than your body is ready for. Overuse from doing the same movements too many times. Staying in one position for too long. Improper preparation, form, or technique when playing a sport or doing an activity. Not enough water or other fluids in your body (dehydration). Other causes may include: Injury. Side effects of some medicines. Too few salts and minerals in your body (electrolytes), such as potassium and calcium. This could happen if you are taking water pills (diuretics) or if you are pregnant. In many cases, the cause of muscle cramps or spasms is not known. Follow these instructions at home: Eating and drinking Drink enough fluid to keep your pee (urine) pale yellow. This can help prevent cramps or spasms. Eat a healthy diet that includes a lot of nutrients to help your muscles work. A healthy diet includes fruits and vegetables, lean protein, whole grains, and low-fat or nonfat dairy products. Managing pain and stiffness     Try to massage, stretch, and relax the affected muscle. Do this for a few minutes at a time. If told,  put ice on the muscles. This may help if you are sore or have pain after a cramp or spasm. Put ice in a plastic bag. Place a towel between your skin and the bag. Leave the ice on for 20 minutes, 2-3 times a day. If told, apply heat to tight or tense muscles as often as told by your health care provider. Use the heat source that your provider recommends, such as a moist heat pack or a heating pad. Place a towel between your skin and the heat source. Leave the heat on for 20-30 minutes. If your skin turns bright red, remove the ice or heat right away to prevent skin damage. The risk of damage is higher if you cannot feel pain, heat, or cold. Take hot showers or baths to help relax tight muscles. General instructions If you are having cramps often, avoid intense exercise for a few days. Take over-the-counter and prescription medicines only as told by your provider. Watch for any changes in your symptoms. Contact a health care provider if: Your cramps or spasms get more severe or happen more often. Your cramps or spasms do not get better over time. This information is not intended to replace advice given to you by your health care provider. Make sure you discuss any questions you have with your health care provider. Document Revised: 04/07/2022 Document Reviewed: 04/07/2022 Elsevier Patient Education  2024 [employer]  

## 2023-03-08 ENCOUNTER — Other Ambulatory Visit: Payer: Self-pay | Admitting: Nurse Practitioner

## 2023-03-08 ENCOUNTER — Encounter: Payer: Self-pay | Admitting: Nurse Practitioner

## 2023-03-08 DIAGNOSIS — M545 Low back pain, unspecified: Secondary | ICD-10-CM

## 2023-03-09 ENCOUNTER — Ambulatory Visit
Admission: RE | Admit: 2023-03-09 | Discharge: 2023-03-09 | Disposition: A | Discharge status: Home / Self Care | Payer: BLUE CROSS/BLUE SHIELD | Source: Ambulatory Visit | Attending: Nurse Practitioner | Admitting: Nurse Practitioner

## 2023-03-09 DIAGNOSIS — M545 Low back pain, unspecified: Secondary | ICD-10-CM

## 2023-03-15 ENCOUNTER — Other Ambulatory Visit: Payer: Self-pay | Admitting: Nurse Practitioner

## 2023-03-15 DIAGNOSIS — S32010A Wedge compression fracture of first lumbar vertebra, initial encounter for closed fracture: Secondary | ICD-10-CM

## 2023-03-15 DIAGNOSIS — M5136 Other intervertebral disc degeneration, lumbar region: Secondary | ICD-10-CM

## 2023-03-15 NOTE — Progress Notes (Unsigned)
COMPLETE PHYSICAL  LABS DONE AT [facility], PATIENT WORKS THERE AND THEY ARE FREE- GIVEN LAB RX    Assessment / Plan:    Encounter for routine medical examination with abnormal findings. Yearly  Essential hypertension Continue current medications:HCTZ 12.5mg  daily in am, zestril 40mg   Monitor blood pressure at home; call if consistently over 130/80 Continue DASH diet.   Reminder to go to the ER if any CP, SOB, nausea, dizziness, severe HA, changes vision/speech, left arm numbness and tingling and jaw pain.   Abnormal glucose Discussed general issues about diabetes pathophysiology and management., Educational material distributed., Suggested low cholesterol diet., Encouraged aerobic exercise., Discussed foot care., Reminded to get yearly retinal exam.   Hyperlipidemia -continue medications, check lipids, decrease fatty foods, increase activity.   Morbid obesity - follow up 3 months for progress monitoring - increase veggies, decrease carbs - long discussion about weight loss, diet, and exercise - CONTINUE WEIGHT LOSS, WEIGHT LOSS GOAL IS 180   LAP-BAND surgery status Follow up Dr. Daphine Deutscher   Asthma, unspecified asthma severity, uncomplicated Asthma controlled   IBS (irritable bowel syndrome) Diet controlled   Gastroesophageal reflux disease with esophagitis Continue PPI/H2 blocker, diet discussed  Osteoarthritis, unspecified osteoarthritis type, unspecified site Managing well at this time  Non-seasonal allergic rhinitis, unspecified trigger Managing well at this time Uses Clairatin OTC PRN  Vitamin D deficiency Continue supplementation to maintain goal of 70-100 Taking Vitamin D 5,000 IU daily Defer vitamin D level  Former smoker Monitor   Medication management Continued  Irritable bowel syndrome with diarrhea -     dicyclomine (BENTYL) 10 MG capsule; Take 1 capsule (10 mg total) by mouth 4 (four) times daily for 14 days.  Abnormal glucose Discussed dietary  and exercise modifications   Menopausal vasomotor symptoms Taking Effexor 75mg    Discussed med's effects and SE's. Screening labs and tests as requested with regular follow-up as recommended.Gets labs done at [facility]  Further disposition pending results if labs check today. Discussed med's effects and SE's.   Over 40 minutes of face to face interview, exam, counseling, chart review, and critical decision making was performed.    HPI 67 y.o. female  presents for a complete physical and follow up on HTN, HLD Weight, viamin D defciency and abnormal glucose.  Still working at [employer], retired, benefits for about months.  No health or medication concerns today.   She reports that she has had a change in her smell.  She reports over the past three months and it is intermittent in nature.  She reports things that are warm or hot have a bad smell.  She reports she is not able to describe the smell but  BMI is There is no height or weight on file to calculate BMI., she is working on diet and exercise. She is on optivia x August and has lost 30 lbs since that time. She states she is walking better and doing better.  Wt Readings from Last 3 Encounters:  02/18/23 223 lb 9.6 oz (101.4 kg)  01/21/23 227 lb (103 kg)  12/14/22 222 lb 12.8 oz (101.1 kg)   Her blood pressure has been controlled at home, runs 120's at home, on lisinopril 40 and HCTZ 12.5mg  today their BP is  . She had her She denies chest pain, shortness of breath, dizziness.   Her cholesterol is diet controlled. Her cholesterol is controlled. She is NOT fasting for labs today. The cholesterol last visit was:  Lab Results  Component Value Date   CHOL 187 09/14/2022   HDL 78 09/14/2022   LDLCALC 84 09/14/2022   TRIG 145 09/14/2022   CHOLHDL 2.4 09/14/2022   She has been working on diet and exercise for prediabetes, and denies blurry vision, polydipsia, polyphagia and polyuria. Last A1C in the office was: Lab Results   Component Value Date   HGBA1C 5.2 03/31/2022  Patient is on Vitamin D supplements.  Lab Results  Component Value Date   VD25OH 13 03/31/2022   She is on prilosec for GERD. She is on effexor for IBS/diarrhea, she feels that it is not helping but would like to stay on it to prevent hot flashes.    She has lower back pain with sciatica left leg occ, managing well at this time.  Current Medications:  Current Outpatient Medications on File Prior to Visit  Medication Sig Dispense Refill   Cholecalciferol (VITAMIN D-3) 5000 units TABS Take 5,000 Units by mouth daily.     dexamethasone (DECADRON) 4 MG tablet Take 3 tabs for 3 days, 2 tabs for 3 days 1 tab for 5 days. Take with food. 20 tablet 0   ELDERBERRY PO Take by mouth.     famotidine (PEPCID) 40 MG tablet 1 tab(s) orally 2 times a day for 90 days     fluticasone (FLONASE) 50 MCG/ACT nasal spray Place 1-2 sprays into both nostrils at bedtime. 48 g 3   hydrochlorothiazide (HYDRODIURIL) 12.5 MG tablet TAKE 1 TABLET BY MOUTH DAILY FOR BLOOD PRESSURE AND FLUID 90 tablet 1   ibuprofen (ADVIL) 600 MG tablet Take 600 mg by mouth every 6 (six) hours as needed.     lisinopril (ZESTRIL) 20 MG tablet Take 1 tablet (20 mg total) by mouth daily. (Patient taking differently: Take 10 mg by mouth daily.) 30 tablet 11   loratadine (CLARITIN) 10 MG tablet Take 10 mg by mouth every morning.      MAGNESIUM PO Take by mouth.     Multiple Vitamin (MULTIVITAMIN) capsule Take 1 capsule by mouth daily.      OVER THE COUNTER MEDICATION Balance of nature     pantoprazole (PROTONIX) 40 MG tablet TAKE ONE TABLET (40 MG) BY MOUTH EVERY DAY 30 tablet 3   Semaglutide, 1 MG/DOSE, 4 MG/3ML SOPN Inject 1 mg into the skin once a week. 9 mL 3   venlafaxine XR (EFFEXOR-XR) 75 MG 24 hr capsule Take  1 capsule  Daily for Mood                                                    /                                        TAKE                                                 BY  MOUTH                                                ONCE  ? ? ?  DAILY 90 capsule 3   Zinc 15 MG CAPS Take by mouth.     No current facility-administered medications on file prior to visit.   Health Maintenance:  Immunization History  Administered Date(s) Administered   Fluad Quad(high Dose 65+) 05/08/2020   Influenza Inj Mdck Quad With Preservative 06/27/2018   Influenza, High Dose Seasonal PF 06/17/2022   Influenza,inj,Quad PF,6+ Mos 06/06/2017, 05/19/2019   Influenza-Unspecified 07/11/2013, 06/23/2017, 06/30/2021   PFIZER(Purple Top)SARS-COV-2 Vaccination 11/01/2019, 11/22/2019, 06/29/2020   Respiratory Syncytial Virus Vaccine,Recomb Aduvanted(Arexvy) 06/17/2022   Td 04/28/2006   Td (Adult), 2 Lf Tetanus Toxid, Preservative Free 04/28/2006   Tdap 12/04/2015   Tetanus: 2017 Pneumovax: 2021 Prevnar 13: due at 65 Flu vaccine: 2020 Zostavax: discussed  Pap: Feb 2020, has OV next year MGM: 12/2017 3D neg CAT B at [facility]- (Dr. Seymour Bars)  DEXA: 2010 normal- non smoker, no family history, BMI elevated, no broken bones- will schedule with Dr. Seymour Bars Feb/2022 Colonoscopy: November 04 2018 (Dr. Loreta Ave) EGD: N/A Myoview stress test 10/2001 AB [place] 2003  DEE 01/2015 Dr. Marcheta Grammes Dentist: Dr. Joesphine Bare in [place], q 6 months Derm Dr. Roseanne Reno, [place]  Medical History:  Past Medical History:  Diagnosis Date   Arthritis    LEFT KNEE, LOWER BACK   GERD (gastroesophageal reflux disease)    History of hiatal hernia    History of obstructive sleep apnea    per pt history osa w/ cpap until wt loss after gastric band, retested negative for sleep apnea   Hypertension    IBS (irritable bowel syndrome)    Numbness    BOTTOM LEFT FOOT   PONV (postoperative nausea and vomiting)    PONV with gallbladder    Seasonal allergies    Sinus drainage    Snores    WEARS NIGHT GUARD   SUI (stress urinary incontinence, female)    Allergies Allergies  Allergen  Reactions   Mucinex [Guaifenesin Er] Other (See Comments)    "feels weird and dizzy"   Tramadol Itching    SURGICAL HISTORY She  has a past surgical history that includes Laparoscopic gastric banding (11/05/2009   dr Daphine Deutscher); Laparoscopic gastric banding with hiatal hernia repair (N/A, 01/22/2015); ORIF toe fracture (Left); Colonoscopy; Esophagogastroduodenoscopy; Total knee arthroplasty (Right, 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 FAMILY HISTORY Her family history includes Cancer in her paternal grandfather; Heart disease in her father; Hyperlipidemia in her father. SOCIAL HISTORY She  reports that she quit smoking about 24 years ago. Her smoking use included cigarettes. She started smoking about 52 years ago. She has a 7 pack-year smoking history. She has never used smokeless tobacco. She reports that she does not drink alcohol and does not use drugs.  Review of Systems  Constitutional:  Negative for chills and fever.  HENT:  Negative for congestion, hearing loss, sinus pain, sore throat and tinnitus.   Eyes:  Negative for blurred vision and double vision.  Respiratory:  Negative for cough, hemoptysis, sputum production, shortness of breath and wheezing.   Cardiovascular:  Negative for chest pain, palpitations and leg swelling.  Gastrointestinal:  Negative for abdominal pain, constipation, diarrhea, heartburn, nausea and vomiting.  Genitourinary:  Negative for dysuria and urgency.  Musculoskeletal:  Negative for back pain, falls, joint pain, myalgias and neck pain.  Skin:  Negative for rash.  Neurological:  Negative for  dizziness, tingling, tremors, weakness and headaches.  Endo/Heme/Allergies:  Does not bruise/bleed easily.  Psychiatric/Behavioral:  Negative for depression and suicidal ideas. The patient is not nervous/anxious and does not have insomnia.     Physical Exam: There were no vitals taken for this visit. There is no height or weight on file to calculate BMI. General Appearance: Well nourished, in no apparent distress. Eyes: PERRLA, EOMs, conjunctiva no swelling or erythema, normal fundi and vessels. Sinuses: No Frontal/maxillary tenderness ENT/Mouth: Ext aud canals clear, normal light reflex with TMs without erythema, bulging.  Good dentition. No erythema, swelling, or exudate on post pharynx. Tonsils not swollen or erythematous. Hearing normal.  Neck: Supple, thyroid normal. No bruits Respiratory: Respiratory effort normal, BS equal bilaterally without rales, rhonchi, wheezing or stridor. Cardio: RRR without murmurs, rubs or gallops. Brisk peripheral pulses without edema.  Chest: symmetric, with normal excursions and percussion. Breasts: defer Abdomen: Soft, +BS, obese, notenderness, no guarding, rebound, hernias, masses, or organomegaly. .  Lymphatics: Non tender without lymphadenopathy.  Genitourinary: defer Musculoskeletal: Full ROM all peripheral extremities,5/5 strength, and antalgic gait with cane. Skin: left cheek near neck with flesh colored nodule 2x3, no telangiectasias, erythema, will monitor. Warm, dry without rashes, lesions, ecchymosis.  Neuro: Cranial nerves intact, reflexes equal bilaterally. Normal muscle tone, no cerebellar symptoms. Sensation intact.  Psych: Awake and oriented X 3, normal affect, Insight and Judgment appropriate.    EKG: EKG WNL     Loni Dolly, DNP [facility] 03/15/2023  12:34 PM

## 2023-03-16 ENCOUNTER — Encounter: Payer: Self-pay | Admitting: Nurse Practitioner

## 2023-03-16 ENCOUNTER — Ambulatory Visit (INDEPENDENT_AMBULATORY_CARE_PROVIDER_SITE_OTHER): Payer: BLUE CROSS/BLUE SHIELD | Admitting: Nurse Practitioner

## 2023-03-16 VITALS — BP 130/90 | HR 83 | Temp 97.3°F | Ht 60.0 in | Wt 222.8 lb

## 2023-03-16 DIAGNOSIS — I700 Atherosclerosis of aorta: Secondary | ICD-10-CM

## 2023-03-16 DIAGNOSIS — R7309 Other abnormal glucose: Secondary | ICD-10-CM

## 2023-03-16 DIAGNOSIS — E559 Vitamin D deficiency, unspecified: Secondary | ICD-10-CM

## 2023-03-16 DIAGNOSIS — M545 Low back pain, unspecified: Secondary | ICD-10-CM

## 2023-03-16 DIAGNOSIS — Z13220 Encounter for screening for lipoid disorders: Secondary | ICD-10-CM | POA: Diagnosis not present

## 2023-03-16 DIAGNOSIS — Z79899 Other long term (current) drug therapy: Secondary | ICD-10-CM | POA: Diagnosis not present

## 2023-03-16 DIAGNOSIS — Z131 Encounter for screening for diabetes mellitus: Secondary | ICD-10-CM | POA: Diagnosis not present

## 2023-03-16 DIAGNOSIS — Z136 Encounter for screening for cardiovascular disorders: Secondary | ICD-10-CM

## 2023-03-16 DIAGNOSIS — Z1389 Encounter for screening for other disorder: Secondary | ICD-10-CM

## 2023-03-16 DIAGNOSIS — J4520 Mild intermittent asthma, uncomplicated: Secondary | ICD-10-CM

## 2023-03-16 DIAGNOSIS — M51369 Other intervertebral disc degeneration, lumbar region without mention of lumbar back pain or lower extremity pain: Secondary | ICD-10-CM

## 2023-03-16 DIAGNOSIS — Z0000 Encounter for general adult medical examination without abnormal findings: Secondary | ICD-10-CM

## 2023-03-16 DIAGNOSIS — S32010A Wedge compression fracture of first lumbar vertebra, initial encounter for closed fracture: Secondary | ICD-10-CM

## 2023-03-16 DIAGNOSIS — E785 Hyperlipidemia, unspecified: Secondary | ICD-10-CM

## 2023-03-16 DIAGNOSIS — I10 Essential (primary) hypertension: Secondary | ICD-10-CM | POA: Diagnosis not present

## 2023-03-16 DIAGNOSIS — Z9884 Bariatric surgery status: Secondary | ICD-10-CM

## 2023-03-16 DIAGNOSIS — M5136 Other intervertebral disc degeneration, lumbar region: Secondary | ICD-10-CM

## 2023-03-16 DIAGNOSIS — J3089 Other allergic rhinitis: Secondary | ICD-10-CM

## 2023-03-16 DIAGNOSIS — K589 Irritable bowel syndrome without diarrhea: Secondary | ICD-10-CM

## 2023-03-16 DIAGNOSIS — Z1329 Encounter for screening for other suspected endocrine disorder: Secondary | ICD-10-CM

## 2023-03-16 DIAGNOSIS — Z0001 Encounter for general adult medical examination with abnormal findings: Secondary | ICD-10-CM

## 2023-03-16 DIAGNOSIS — B001 Herpesviral vesicular dermatitis: Secondary | ICD-10-CM

## 2023-03-16 DIAGNOSIS — K219 Gastro-esophageal reflux disease without esophagitis: Secondary | ICD-10-CM

## 2023-03-16 DIAGNOSIS — M1990 Unspecified osteoarthritis, unspecified site: Secondary | ICD-10-CM

## 2023-03-16 DIAGNOSIS — Z87891 Personal history of nicotine dependence: Secondary | ICD-10-CM

## 2023-03-16 MED ORDER — VALACYCLOVIR HCL 1 G PO TABS
1000.0000 mg | ORAL_TABLET | Freq: Three times a day (TID) | ORAL | 0 refills | Status: DC
Start: 2023-03-16 — End: 2023-11-22

## 2023-03-16 MED ORDER — LISINOPRIL 10 MG PO TABS
10.0000 mg | ORAL_TABLET | Freq: Every day | ORAL | Status: DC
Start: 2023-03-16 — End: 2023-08-16

## 2023-03-16 NOTE — Patient Instructions (Signed)

## 2023-03-17 LAB — URINALYSIS, ROUTINE W REFLEX MICROSCOPIC
Bilirubin Urine: NEGATIVE
Glucose, UA: NEGATIVE
Hgb urine dipstick: NEGATIVE
Ketones, ur: NEGATIVE
Leukocytes,Ua: NEGATIVE
Nitrite: NEGATIVE
Protein, ur: NEGATIVE
Specific Gravity, Urine: 1.018 (ref 1.001–1.035)
pH: 7 (ref 5.0–8.0)

## 2023-03-17 LAB — CBC WITH DIFFERENTIAL/PLATELET
Absolute Monocytes: 310 cells/uL (ref 200–950)
Basophils Absolute: 30 cells/uL (ref 0–200)
Basophils Relative: 0.7 %
Eosinophils Absolute: 82 cells/uL (ref 15–500)
Eosinophils Relative: 1.9 %
HCT: 38.7 % (ref 35.0–45.0)
Hemoglobin: 13.1 g/dL (ref 11.7–15.5)
Lymphs Abs: 1445 cells/uL (ref 850–3900)
MCH: 31.4 pg (ref 27.0–33.0)
MCHC: 33.9 g/dL (ref 32.0–36.0)
MCV: 92.8 fL (ref 80.0–100.0)
MPV: 9.8 fL (ref 7.5–12.5)
Monocytes Relative: 7.2 %
Neutro Abs: 2434 cells/uL (ref 1500–7800)
Neutrophils Relative %: 56.6 %
Platelets: 293 10*3/uL (ref 140–400)
RBC: 4.17 10*6/uL (ref 3.80–5.10)
RDW: 13.7 % (ref 11.0–15.0)
Total Lymphocyte: 33.6 %
WBC: 4.3 10*3/uL (ref 3.8–10.8)

## 2023-03-17 LAB — COMPLETE METABOLIC PANEL WITH GFR
AG Ratio: 1.7 (calc) (ref 1.0–2.5)
ALT: 21 U/L (ref 6–29)
AST: 15 U/L (ref 10–35)
Albumin: 4.1 g/dL (ref 3.6–5.1)
Alkaline phosphatase (APISO): 83 U/L (ref 37–153)
BUN: 14 mg/dL (ref 7–25)
CO2: 30 mmol/L (ref 20–32)
Calcium: 9.7 mg/dL (ref 8.6–10.4)
Chloride: 100 mmol/L (ref 98–110)
Creat: 0.67 mg/dL (ref 0.50–1.05)
Globulin: 2.4 g/dL (calc) (ref 1.9–3.7)
Glucose, Bld: 93 mg/dL (ref 65–99)
Potassium: 4.3 mmol/L (ref 3.5–5.3)
Sodium: 138 mmol/L (ref 135–146)
Total Bilirubin: 0.6 mg/dL (ref 0.2–1.2)
Total Protein: 6.5 g/dL (ref 6.1–8.1)
eGFR: 95 mL/min/{1.73_m2} (ref >=60)

## 2023-03-17 LAB — TSH: TSH: 1.69 mIU/L (ref 0.40–4.50)

## 2023-03-17 LAB — LIPID PANEL
Cholesterol: 221 mg/dL — ABNORMAL HIGH (ref <200)
HDL: 71 mg/dL (ref >=50)
LDL Cholesterol (Calc): 118 mg/dL (calc) — ABNORMAL HIGH
Non-HDL Cholesterol (Calc): 150 mg/dL (calc) — ABNORMAL HIGH (ref <130)
Total CHOL/HDL Ratio: 3.1 (calc) (ref <5.0)
Triglycerides: 206 mg/dL — ABNORMAL HIGH (ref <150)

## 2023-03-17 LAB — MICROALBUMIN / CREATININE URINE RATIO
Creatinine, Urine: 90 mg/dL (ref 20–275)
Microalb Creat Ratio: 3 mg/g creat (ref <30)
Microalb, Ur: 0.3 mg/dL

## 2023-03-17 LAB — MAGNESIUM: Magnesium: 2.3 mg/dL (ref 1.5–2.5)

## 2023-03-17 LAB — VITAMIN D 25 HYDROXY (VIT D DEFICIENCY, FRACTURES): Vit D, 25-Hydroxy: 64 ng/mL (ref 30–100)

## 2023-03-17 LAB — HEMOGLOBIN A1C W/OUT EAG: Hgb A1c MFr Bld: 5.9 % of total Hgb — ABNORMAL HIGH (ref <5.7)

## 2023-05-21 ENCOUNTER — Other Ambulatory Visit: Payer: Self-pay | Admitting: Nurse Practitioner

## 2023-06-28 ENCOUNTER — Encounter: Payer: Self-pay | Admitting: Nurse Practitioner

## 2023-06-28 MED ORDER — FAMOTIDINE 40 MG PO TABS: ORAL_TABLET | ORAL | 3 refills | Status: DC

## 2023-07-19 ENCOUNTER — Other Ambulatory Visit: Payer: Self-pay | Admitting: Nurse Practitioner

## 2023-08-10 ENCOUNTER — Ambulatory Visit: Payer: BLUE CROSS/BLUE SHIELD | Admitting: Dermatology

## 2023-08-10 ENCOUNTER — Encounter: Payer: Self-pay | Admitting: Dermatology

## 2023-08-10 DIAGNOSIS — Z1283 Encounter for screening for malignant neoplasm of skin: Secondary | ICD-10-CM

## 2023-08-10 DIAGNOSIS — L578 Other skin changes due to chronic exposure to nonionizing radiation: Secondary | ICD-10-CM | POA: Diagnosis not present

## 2023-08-10 DIAGNOSIS — L814 Other melanin hyperpigmentation: Secondary | ICD-10-CM

## 2023-08-10 DIAGNOSIS — W908XXA Exposure to other nonionizing radiation, initial encounter: Secondary | ICD-10-CM | POA: Diagnosis not present

## 2023-08-10 DIAGNOSIS — L820 Inflamed seborrheic keratosis: Secondary | ICD-10-CM | POA: Diagnosis not present

## 2023-08-10 DIAGNOSIS — D1801 Hemangioma of skin and subcutaneous tissue: Secondary | ICD-10-CM

## 2023-08-10 DIAGNOSIS — L821 Other seborrheic keratosis: Secondary | ICD-10-CM

## 2023-08-10 DIAGNOSIS — D229 Melanocytic nevi, unspecified: Secondary | ICD-10-CM

## 2023-08-10 DIAGNOSIS — L853 Xerosis cutis: Secondary | ICD-10-CM

## 2023-08-10 NOTE — Patient Instructions (Signed)
Cryotherapy Aftercare  Wash gently with soap and water everyday.   Apply Vaseline Jelly daily until healed.    Recommend daily broad spectrum sunscreen SPF 30+ to sun-exposed areas, reapply every 2 hours as needed. Call for new or changing lesions.  Staying in the shade or wearing long sleeves, sun glasses (UVA+UVB protection) and wide brim hats (4-inch brim around the entire circumference of the hat) are also recommended for sun protection.     Gentle Skin Care Guide  1. Bathe no more than once a day.  2. Avoid bathing in hot water  3. Use a mild soap like Dove, Vanicream, Cetaphil, CeraVe. Can use Lever 2000 or Cetaphil antibacterial soap  4. Use soap only where you need it. On most days, use it under your arms, between your legs, and on your feet. Let the water rinse other areas unless visibly dirty.  5. When you get out of the bath/shower, use a towel to gently blot your skin dry, don't rub it.  6. While your skin is still a little damp, apply a moisturizing cream such as Vanicream, CeraVe, Cetaphil, Eucerin, Sarna lotion or plain Vaseline Jelly. For hands apply Neutrogena [place] Hand Cream or Excipial Hand Cream.  7. Reapply moisturizer any time you start to itch or feel dry.  8. Sometimes using free and clear laundry detergents can be helpful. Fabric softener sheets should be avoided. Downy Free & Gentle liquid, or any liquid fabric softener that is free of dyes and perfumes, it acceptable to use  9. If your doctor has given you prescription creams you may apply moisturizers over them      Melanoma ABCDEs  Melanoma is the most dangerous type of skin cancer, and is the leading cause of death from skin disease.  You are more likely to develop melanoma if you: Have light-colored skin, light-colored eyes, or red or blond hair Spend a lot of time in the sun Tan regularly, either outdoors or in a tanning bed Have had blistering sunburns, especially during childhood Have a  close family member who has had a melanoma Have atypical moles or large birthmarks  Early detection of melanoma is key since treatment is typically straightforward and cure rates are extremely high if we catch it early.   The first sign of melanoma is often a change in a mole or a new dark spot.  The ABCDE system is a way of remembering the signs of melanoma.  A for asymmetry:  The two halves do not match. B for border:  The edges of the growth are irregular. C for color:  A mixture of colors are present instead of an even brown color. D for diameter:  Melanomas are usually (but not always) greater than 6mm - the size of a pencil eraser. E for evolution:  The spot keeps changing in size, shape, and color.  Please check your skin once per month between visits. You can use a small mirror in front and a large mirror behind you to keep an eye on the back side or your body.   If you see any new or changing lesions before your next follow-up, please call to schedule a visit.  Please continue daily skin protection including broad spectrum sunscreen SPF 30+ to sun-exposed areas, reapplying every 2 hours as needed when you're outdoors.   Staying in the shade or wearing long sleeves, sun glasses (UVA+UVB protection) and wide brim hats (4-inch brim around the entire circumference of the hat) are also recommended  for sun protection.       Due to recent changes in healthcare laws, you may see results of your pathology and/or laboratory studies on MyChart before the doctors have had a chance to review them. We understand that in some cases there may be results that are confusing or concerning to you. Please understand that not all results are received at the same time and often the doctors may need to interpret multiple results in order to provide you with the best plan of care or course of treatment. Therefore, we ask that you please give [place] 2 business days to thoroughly review all your results before  contacting the office for clarification. Should we see a critical lab result, you will be contacted sooner.   If You Need Anything After Your Visit  If you have any questions or concerns for your doctor, please call our main line at [phone] and press option 4 to reach your doctor's medical assistant. If no one answers, please leave a voicemail as directed and we will return your call as soon as possible. Messages left after 4 pm will be answered the following business day.   You may also send [place] a message via MyChart. We typically respond to MyChart messages within 1-2 business days.  For prescription refills, please ask your pharmacy to contact our office. Our fax number is [phone].  If you have an urgent issue when the clinic is closed that cannot wait until the next business day, you can page your doctor at the number below.    Please note that while we do our best to be available for urgent issues outside of office hours, we are not available 24/7.   If you have an urgent issue and are unable to reach [place], you may choose to seek medical care at your doctor's office, retail clinic, urgent care center, or emergency room.  If you have a medical emergency, please immediately call 911 or go to the emergency department.  Pager Numbers  - Dr. Gwen Pounds: [phone]  - Dr. Roseanne Reno: [phone]  - Dr. Katrinka Blazing: [phone]   In the event of inclement weather, please call our main line at [phone] for an update on the status of any delays or closures.  Dermatology Medication Tips: Please keep the boxes that topical medications come in in order to help keep track of the instructions about where and how to use these. Pharmacies typically print the medication instructions only on the boxes and not directly on the medication tubes.   If your medication is too expensive, please contact our office at [phone] option 4 or send [place] a message through MyChart.   We are unable to tell  what your co-pay for medications will be in advance as this is different depending on your insurance coverage. However, we may be able to find a substitute medication at lower cost or fill out paperwork to get insurance to cover a needed medication.   If a prior authorization is required to get your medication covered by your insurance company, please allow [place] 1-2 business days to complete this process.  Drug prices often vary depending on where the prescription is filled and some pharmacies may offer cheaper prices.  The website www.goodrx.com contains coupons for medications through different pharmacies. The prices here do not account for what the cost may be with help from insurance (it may be cheaper with your insurance), but the website can give you the price if you did not use any insurance.  -  You can print the associated coupon and take it with your prescription to the pharmacy.  - You may also stop by our office during regular business hours and pick up a GoodRx coupon card.  - If you need your prescription sent electronically to a different pharmacy, notify our office through [facility] or by phone at [phone] option 4.     Si Usted Necesita Algo Despus de Su Visita  Tambin puede enviarnos un mensaje a travs de Clinical cytogeneticist. Por lo general respondemos a los mensajes de MyChart en el transcurso de 1 a 2 das hbiles.  Para renovar recetas, por favor pida a su farmacia que se ponga en contacto con nuestra oficina. Annie Sable de fax es [place] [phone].  Si tiene un asunto urgente cuando la clnica est cerrada y que no puede esperar hasta el siguiente da hbil, puede llamar/localizar a su doctor(a) al nmero que aparece a continuacin.   Por favor, tenga en cuenta que aunque hacemos todo lo posible para estar disponibles para asuntos urgentes fuera del horario de [place], no estamos disponibles las 24 horas del da, los 7 [address]  [address] de la [place].   Si tiene un problema  urgente y no puede comunicarse con nosotros, puede optar por buscar atencin mdica  en el consultorio de su doctor(a), en una clnica privada, en un centro de atencin urgente o en una sala de emergencias.  Si tiene Engineer, drilling, por favor llame inmediatamente al 911 o vaya a la sala de emergencias.  Nmeros de bper  - Dr. Gwen Pounds: [phone]  - Dra. Roseanne Reno: [phone]  - Dr. Katrinka Blazing: [phone]   En caso de inclemencias del tiempo, por favor llame a Lacy Duverney principal al [phone] para una actualizacin sobre el [place] de cualquier retraso o cierre.  Consejos para la medicacin en dermatologa: Por favor, guarde las cajas en las que vienen los medicamentos de uso tpico para ayudarle a seguir las instrucciones sobre dnde y cmo usarlos. Las farmacias generalmente imprimen las instrucciones del medicamento slo en las cajas y no directamente en los tubos del [place].   Si su medicamento es muy caro, por favor, pngase en contacto con Rolm [place] llamando al [phone] y presione la opcin 4 o envenos un mensaje a travs de Clinical cytogeneticist.   No podemos decirle cul ser su copago por los medicamentos por adelantado ya que esto es diferente dependiendo de la cobertura de su seguro. Sin embargo, es posible que podamos encontrar un medicamento sustituto a Audiological scientist un formulario para que el seguro cubra el medicamento que se considera necesario.   Si se requiere una autorizacin previa para que su compaa de seguros [place] su medicamento, por favor permtanos de 1 a 2 das hbiles para completar [address]  Los precios de los medicamentos varan con frecuencia dependiendo del Environmental consultant de dnde se surte la receta y alguna farmacias pueden ofrecer precios ms baratos.  El sitio web www.goodrx.com tiene cupones para medicamentos de Health and safety inspector. Los precios aqu no tienen en cuenta lo que podra costar con la ayuda del seguro (puede ser ms barato  con su seguro), pero el sitio web puede darle el precio si no utiliz Tourist information centre manager.  - Puede imprimir el cupn correspondiente y llevarlo con su receta a la farmacia.  - Tambin puede pasar por nuestra oficina durante el horario de atencin regular y Education officer, museum una tarjeta de cupones de GoodRx.  - Si necesita que su receta se enve electrnicamente a [place] [employer],  informe a nuestra oficina a travs de MyChart de [place] o por telfono llamando al [phone] y presione la opcin 4.

## 2023-08-10 NOTE — Progress Notes (Signed)
   Follow-Up Visit   Subjective  Maureen [name] is a 67 y.o. female who presents for the following: Skin Cancer Screening and Full Body Skin Exam. No personal hx of skin cancer or dysplastic nevi.  Spot at left upper forehead. Itches badly at times. Scaly at times.    The patient presents for Total-Body Skin Exam (TBSE) for skin cancer screening and mole check. The patient has spots, moles and lesions to be evaluated, some may be new or changing and the patient may have concern these could be cancer.    The following portions of the chart were reviewed this encounter and updated as appropriate: medications, allergies, medical history  Review of Systems:  No other skin or systemic complaints except as noted in HPI or Assessment and Plan.  Objective  Well appearing patient in no apparent distress; mood and affect are within normal limits.  A full examination was performed including scalp, head, eyes, ears, nose, lips, neck, chest, axillae, abdomen, back, buttocks, bilateral upper extremities, bilateral lower extremities, hands, feet, fingers, toes, fingernails, and toenails. All findings within normal limits unless otherwise noted below.   Relevant physical exam findings are noted in the Assessment and Plan.  Left Upper Forehead x1, R medial thigh x2 (3) Erythematous keratotic or waxy stuck-on papule or plaque.    Assessment & Plan   SKIN CANCER SCREENING PERFORMED TODAY.  ACTINIC DAMAGE - Chronic condition, secondary to cumulative UV/sun exposure - diffuse scaly erythematous macules with underlying dyspigmentation - Recommend daily broad spectrum sunscreen SPF 30+ to sun-exposed areas, reapply every 2 hours as needed.  - Staying in the shade or wearing long sleeves, sun glasses (UVA+UVB protection) and wide brim hats (4-inch brim around the entire circumference of the hat) are also recommended for sun protection.  - Call for new or changing lesions.  LENTIGINES, SEBORRHEIC  KERATOSES, HEMANGIOMAS - Benign normal skin lesions. Face, torso, arms.  - Benign-appearing - Call for any changes  MELANOCYTIC NEVI - Tan-brown and/or pink-flesh-colored symmetric macules and papules - Benign appearing on exam today - Observation - Call clinic for new or changing moles - Recommend daily use of broad spectrum spf 30+ sunscreen to sun-exposed areas.   SEBORRHEIC KERATOSIS - 1.0 cm waxy tan macule, darker inferior at left upper antecubitum.  - Benign-appearing - Discussed benign etiology and prognosis. Stable compared to previous visit. - Observe - Call for any changes  Xerosis - diffuse xerotic patches at legs - recommend gentle, hydrating skin care - gentle skin care handout given   Inflamed seborrheic keratosis (3) Left Upper Forehead x1, R medial thigh x2  Symptomatic, irritating, patient would like treated.  Destruction of lesion - Left Upper Forehead x1, R medial thigh x2 (3)  Destruction method: cryotherapy   Informed consent: discussed and consent obtained   Lesion destroyed using liquid nitrogen: Yes   Region frozen until ice ball extended beyond lesion: Yes   Outcome: patient tolerated procedure well with no complications   Post-procedure details: wound care instructions given   Additional details:  Prior to procedure, discussed risks of blister formation, small wound, skin dyspigmentation, or rare scar following cryotherapy. Recommend Vaseline ointment to treated areas while healing.    Return in about 1 year (around 08/09/2024) for TBSE.  I, Lawson Radar, CMA, am acting as scribe for Willeen Niece, MD.   Documentation: I have reviewed the above documentation for accuracy and completeness, and I agree with the above.  Willeen Niece, MD

## 2023-08-16 ENCOUNTER — Other Ambulatory Visit: Payer: Self-pay | Admitting: Nurse Practitioner

## 2023-08-16 ENCOUNTER — Other Ambulatory Visit: Payer: Self-pay

## 2023-08-16 DIAGNOSIS — I10 Essential (primary) hypertension: Secondary | ICD-10-CM

## 2023-08-16 MED ORDER — LISINOPRIL 10 MG PO TABS
10.0000 mg | ORAL_TABLET | Freq: Every day | ORAL | 3 refills | Status: DC
Start: 2023-08-16 — End: 2023-08-19

## 2023-08-16 MED ORDER — LISINOPRIL 10 MG PO TABS: 10.0000 mg | ORAL_TABLET | Freq: Every day | ORAL | Status: DC

## 2023-08-19 ENCOUNTER — Telehealth: Payer: Self-pay | Admitting: Nurse Practitioner

## 2023-08-19 ENCOUNTER — Other Ambulatory Visit: Payer: Self-pay | Admitting: Nurse Practitioner

## 2023-08-19 DIAGNOSIS — I10 Essential (primary) hypertension: Secondary | ICD-10-CM

## 2023-08-19 MED ORDER — LISINOPRIL 10 MG PO TABS: 10.0000 mg | ORAL_TABLET | Freq: Every day | ORAL | 3 refills | Status: DC

## 2023-08-19 NOTE — Telephone Encounter (Signed)
Patient called for refill on Lisinopril. It looks Maureen [name] tried to send it in, but it did not go through. Pls resend to [facility] - [place], [place] - [address]

## 2023-08-26 ENCOUNTER — Other Ambulatory Visit: Payer: Self-pay | Admitting: Nurse Practitioner

## 2023-08-26 DIAGNOSIS — R7309 Other abnormal glucose: Secondary | ICD-10-CM

## 2023-09-15 ENCOUNTER — Ambulatory Visit: Payer: BLUE CROSS/BLUE SHIELD | Admitting: Nurse Practitioner

## 2023-09-19 NOTE — Progress Notes (Deleted)
Medicare Annual Wellness visit    Assessment / Plan:    Encounter for medicare annual wellness visit Yearly  Essential hypertension Continue current medications:HCTZ 12.5mg  daily in am, zestril 20mg   Monitor blood pressure at home; call if consistently over 130/80 Continue DASH diet.   Reminder to go to the ER if any CP, SOB, nausea, dizziness, severe HA, changes vision/speech, left arm numbness and tingling and jaw pain. - Check CBC   Abnormal glucose Continue Ozempic 1mg  SQ QW Discussed general issues about diabetes pathophysiology and management., Educational material distributed., Suggested low cholesterol diet., Encouraged aerobic exercise., Discussed foot care., Reminded to get yearly retinal exam. - CMP   Hyperlipidemia -continue diet and exercise, weight loss - Check lipid panel - Check CMP   Morbid obesity - follow up 3 months for progress monitoring - increase veggies, decrease carbs - long discussion about weight loss, diet, and exercise - CONTINUE WEIGHT LOSS, WEIGHT LOSS GOAL IS 180   LAP-BAND surgery status Follow up Dr. Daphine Deutscher   Asthma, unspecified asthma severity, uncomplicated Asthma controlled   IBS (irritable bowel syndrome) Diet controlled   Gastroesophageal reflux disease with esophagitis Has noticed more bloating and burping Change to Protonix and monitor, if persists will refer to GI- has a lapband  Osteoarthritis, unspecified osteoarthritis type, unspecified site Managing well at this time  Non-seasonal allergic rhinitis, unspecified trigger Managing well at this time Uses Clairatin OTC PRN  Vitamin D deficiency Continue supplementation to maintain goal of 70-100 Taking Vitamin D 5,000 IU daily Defer vitamin D level  Former smoker Monitor  Medication management - TSH   Menopausal vasomotor symptoms Taking Effexor 75mg    Discussed med's effects and SE's. Screening labs and tests as requested with regular follow-up as  recommended.  Future Appointments  Date Time Provider Department Center  09/20/2023 11:00 AM Raynelle Dick, NP [facility] None  03/15/2024 10:00 AM Raynelle Dick, NP [facility] None  08/15/2024  1:30 PM Willeen Niece, MD [facility] None  09/20/2024 11:00 AM Raynelle Dick, NP [facility] None    Further disposition pending results if labs check today. Discussed med's effects and SE's.   Over 40 minutes of face to face interview, exam, counseling, chart review, and critical decision making was performed.   Plan:   During the course of the visit the patient was educated and counseled about appropriate screening and preventive services including:   Pneumococcal vaccine  Prevnar 13 Influenza vaccine Td vaccine Screening electrocardiogram Bone densitometry screening Colorectal cancer screening Diabetes screening Glaucoma screening Nutrition counseling  Advanced directives: requested   HPI 68 y.o. female  presents for a complete physical and follow up on HTN, HLD Weight, viamin D defciency and abnormal glucose.  Pt has retired and no longer needs to go to [employer] for labs    BMI is There is no height or weight on file to calculate BMI., she is working on diet and exercise. She is She states she is walking better and doing better.  Wt Readings from Last 3 Encounters:  03/16/23 222 lb 12.8 oz (101.1 kg)  02/18/23 223 lb 9.6 oz (101.4 kg)  01/21/23 227 lb (103 kg)   Her blood pressure has been controlled at home, runs 120's at home, on lisinopril 40 and HCTZ 12.5mg  today their BP is  .  BP Readings from Last 3 Encounters:  03/16/23 (!) 130/90  02/18/23 128/78  01/21/23 120/82   She had her She denies chest pain, shortness of breath, dizziness.   Her  cholesterol is diet controlled. Her cholesterol is controlled. She is NOT fasting for labs today. The cholesterol last visit was:  Lab Results  Component Value Date   CHOL 221 (H) 03/16/2023   HDL 71 03/16/2023   LDLCALC 118  (H) 03/16/2023   TRIG 206 (H) 03/16/2023   CHOLHDL 3.1 03/16/2023   She has been working on diet and exercise for prediabetes, and denies blurry vision, polydipsia, polyphagia and polyuria. Last A1C in the office was: Lab Results  Component Value Date   HGBA1C 5.9 (H) 03/16/2023   Patient is on Vitamin D supplements.  Lab Results  Component Value Date   VD25OH 64 03/16/2023   She is on prilosec for GERD. More burping/bloating after eating and drinking . She is currently on omeprazole but not relieving symptoms.  She is on effexor for IBS/diarrhea, she feels that it is not helping but would like to stay on it to prevent hot flashes. The diarrhea is more controlled since using the Ozempic.      Current Medications:  Current Outpatient Medications on File Prior to Visit  Medication Sig Dispense Refill   Cholecalciferol (VITAMIN D-3) 5000 units TABS Take 5,000 Units by mouth daily.     ELDERBERRY PO Take by mouth.     famotidine (PEPCID) 40 MG tablet 1 tab(s) orally 2 times a day for 90 days     famotidine (PEPCID) 40 MG tablet Take 1 tablet daily for Heart burn & Acid Indigestion 90 tablet 3   fluticasone (FLONASE) 50 MCG/ACT nasal spray Place 1-2 sprays into both nostrils at bedtime. 48 g 3   hydrochlorothiazide (HYDRODIURIL) 12.5 MG tablet TAKE 1 TABLET BY MOUTH DAILY FOR BLOOD PRESSURE AND FLUID 90 tablet 1   lisinopril (ZESTRIL) 10 MG tablet Take 1 tablet (10 mg total) by mouth daily. 90 tablet 3   loratadine (CLARITIN) 10 MG tablet Take 10 mg by mouth every morning.      MAGNESIUM PO Take by mouth.     Multiple Vitamin (MULTIVITAMIN) capsule Take 1 capsule by mouth daily.      OVER THE COUNTER MEDICATION Balance of nature     OZEMPIC, 1 MG/DOSE, 4 MG/3ML SOPN INJECT 1 MG INTO THE SKIN ONCE A WEEK 9 mL 3   pantoprazole (PROTONIX) 40 MG tablet TAKE ONE TABLET (40 MG) BY MOUTH EVERY DAY 30 tablet 3   valACYclovir (VALTREX) 1000 MG tablet Take 1 tablet (1,000 mg total) by mouth 3  (three) times daily. Take for 7 days 21 tablet 0   venlafaxine XR (EFFEXOR-XR) 75 MG 24 hr capsule Take  1 capsule  Daily for Mood                                                    /                                        TAKE                                                 BY  MOUTH                                                ONCE  ? ? ?  DAILY 90 capsule 3   Zinc 15 MG CAPS Take by mouth.     No current facility-administered medications on file prior to visit.   Health Maintenance:  Immunization History  Administered Date(s) Administered   Fluad Quad(high Dose 65+) 05/08/2020   Influenza Inj Mdck Quad With Preservative 06/27/2018   Influenza, High Dose Seasonal PF 06/17/2022   Influenza,inj,Quad PF,6+ Mos 06/06/2017, 05/19/2019   Influenza-Unspecified 07/11/2013, 06/23/2017, 06/30/2021   PFIZER(Purple Top)SARS-COV-2 Vaccination 11/01/2019, 11/22/2019, 06/29/2020   Respiratory Syncytial Virus Vaccine,Recomb Aduvanted(Arexvy) 06/17/2022   Td 04/28/2006   Td (Adult), 2 Lf Tetanus Toxid, Preservative Free 04/28/2006   Tdap 12/04/2015   Health Maintenance  Topic Date Due   Hepatitis C Screening  Never done   Zoster Vaccines- Shingrix (1 of 2) Never done   INFLUENZA VACCINE  04/01/2023   COVID-19 Vaccine (4 - 2024-25 season) 05/02/2023   MAMMOGRAM  02/03/2025   DTaP/Tdap/Td (4 - Td or Tdap) 12/03/2025   Colonoscopy  06/18/2031   Pneumonia Vaccine 47+ Years old  Completed   DEXA SCAN  Completed   HPV VACCINES  Aged Out    DEE: 2022 Dr. Jean Rosenthal Dentist: Dr. Joesphine Bare in [place], q 6 months,08/17/22 Derm Dr. Roseanne Reno, [place]  Medical History:  Past Medical History:  Diagnosis Date   Arthritis    LEFT KNEE, LOWER BACK   GERD (gastroesophageal reflux disease)    History of hiatal hernia    History of obstructive sleep apnea    per pt history osa w/ cpap until wt loss after gastric band, retested negative for sleep apnea    Hypertension    IBS (irritable bowel syndrome)    Numbness    BOTTOM LEFT FOOT   PONV (postoperative nausea and vomiting)    PONV with gallbladder    Seasonal allergies    Sinus drainage    Snores    WEARS NIGHT GUARD   SUI (stress urinary incontinence, female)    Allergies Allergies  Allergen Reactions   Mucinex [Guaifenesin Er] Other (See Comments)    "feels weird and dizzy"   Tramadol Itching    SURGICAL HISTORY She  has a past surgical history that includes Laparoscopic gastric banding (11/05/2009   dr Daphine Deutscher); Laparoscopic gastric banding with hiatal hernia repair (N/A, 01/22/2015); ORIF toe fracture (Left); Colonoscopy; Esophagogastroduodenoscopy; Total knee arthroplasty (Right, 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 FAMILY HISTORY Her family history includes Cancer in her paternal grandfather; Heart disease in her father; Hyperlipidemia in her father. SOCIAL HISTORY She  reports that she quit smoking about 25 years ago. Her smoking use included cigarettes. She started smoking about 53 years ago. She has a 7 pack-year smoking history. She has never used smokeless tobacco. She reports that she does not drink alcohol and does not use drugs. MEDICARE WELLNESS OBJECTIVES: Physical activity:   Cardiac risk factors:   Depression/mood screen:      09/14/2022  11:11 AM  Depression screen PHQ 2/9  Decreased Interest 0  Down, Depressed, Hopeless 0  PHQ - 2 Score 0    ADLs:      No data to display           Cognitive Testing  Alert? Yes  Normal Appearance?Yes  Oriented to person? Yes  Place? Yes   Time?  Yes  Recall of three objects?  Yes  Can perform simple calculations? Yes  Displays appropriate judgment?Yes  Can read the correct time from a watch face?Yes  EOL planning:      Review of Systems  Constitutional:  Negative for chills, fever and weight loss.  HENT:  Negative for congestion and hearing loss.   Eyes:  Negative for blurred vision and double vision.  Respiratory:  Negative for cough and shortness of breath.   Cardiovascular:  Negative for chest pain, palpitations, orthopnea and leg swelling.  Gastrointestinal:  Negative for abdominal pain, constipation, diarrhea, heartburn, nausea and vomiting.       Burping and bloating after eating  Musculoskeletal:  Negative for falls, joint pain and myalgias.  Skin:  Negative for rash.  Neurological:  Negative for dizziness, tingling, tremors, loss of consciousness and headaches.  Psychiatric/Behavioral:  Negative for depression, memory loss and suicidal ideas.     Physical Exam: There were no vitals taken for this visit. There is no height or weight on file to calculate BMI. General Appearance: Well nourished, in no apparent distress. Eyes: PERRLA, EOMs, conjunctiva no swelling or erythema, normal fundi and vessels. Sinuses: No Frontal/maxillary tenderness ENT/Mouth: Ext aud canals clear, normal light reflex with TMs without erythema, bulging.  Good dentition. No erythema, swelling, or exudate on post pharynx. Tonsils not swollen or erythematous. Hearing normal.  Neck: Supple, thyroid normal. No bruits Respiratory: Respiratory effort normal, BS equal bilaterally without rales, rhonchi, wheezing or stridor. Cardio: RRR without murmurs, rubs or gallops. Brisk peripheral pulses without edema.  Chest: symmetric, with normal excursions and percussion. Breasts: defer Abdomen: Soft, +BS, obese, notenderness, no guarding, rebound, hernias, masses, or organomegaly. .  Lymphatics: Non tender without lymphadenopathy.  Genitourinary:  defer Musculoskeletal: Full ROM all peripheral extremities,5/5 strength Neuro: Cranial nerves intact, reflexes equal bilaterally. Normal muscle tone, no cerebellar symptoms. Sensation intact.  Psych: Awake and oriented X 3, normal affect, Insight and Judgment appropriate.       Medicare Attestation I have personally reviewed: [name] medical and social history Their use of alcohol, tobacco or illicit drugs Their current medications and supplements [name] functional ability including ADLs,fall risks, home safety risks, cognitive, and hearing and visual impairment Diet and physical activities Evidence for depression or mood disorders  [name] weight, height, BMI, and visual acuity have been recorded in the chart.  I have made referrals, counseling, and provided education to the patient based on review of the above and I have provided the patient with a written personalized care plan for preventive services.      Revonda Humphrey ANP-C  [facility]  09/19/2023

## 2023-09-20 ENCOUNTER — Ambulatory Visit: Payer: BLUE CROSS/BLUE SHIELD | Admitting: Nurse Practitioner

## 2023-09-20 DIAGNOSIS — E559 Vitamin D deficiency, unspecified: Secondary | ICD-10-CM

## 2023-09-20 DIAGNOSIS — K589 Irritable bowel syndrome without diarrhea: Secondary | ICD-10-CM

## 2023-09-20 DIAGNOSIS — I10 Essential (primary) hypertension: Secondary | ICD-10-CM

## 2023-09-20 DIAGNOSIS — R7309 Other abnormal glucose: Secondary | ICD-10-CM

## 2023-09-20 DIAGNOSIS — K2100 Gastro-esophageal reflux disease with esophagitis, without bleeding: Secondary | ICD-10-CM

## 2023-09-20 DIAGNOSIS — Z0001 Encounter for general adult medical examination with abnormal findings: Secondary | ICD-10-CM

## 2023-09-20 DIAGNOSIS — K219 Gastro-esophageal reflux disease without esophagitis: Secondary | ICD-10-CM

## 2023-09-20 DIAGNOSIS — J4520 Mild intermittent asthma, uncomplicated: Secondary | ICD-10-CM

## 2023-09-20 DIAGNOSIS — Z79899 Other long term (current) drug therapy: Secondary | ICD-10-CM

## 2023-09-20 DIAGNOSIS — Z87891 Personal history of nicotine dependence: Secondary | ICD-10-CM

## 2023-09-20 DIAGNOSIS — M1990 Unspecified osteoarthritis, unspecified site: Secondary | ICD-10-CM

## 2023-09-20 DIAGNOSIS — Z9884 Bariatric surgery status: Secondary | ICD-10-CM

## 2023-09-20 DIAGNOSIS — E785 Hyperlipidemia, unspecified: Secondary | ICD-10-CM

## 2023-09-20 DIAGNOSIS — N951 Menopausal and female climacteric states: Secondary | ICD-10-CM

## 2023-09-23 ENCOUNTER — Other Ambulatory Visit: Payer: Self-pay | Admitting: Nurse Practitioner

## 2023-10-15 ENCOUNTER — Ambulatory Visit: Payer: BLUE CROSS/BLUE SHIELD | Admitting: Nurse Practitioner

## 2023-10-20 ENCOUNTER — Other Ambulatory Visit: Payer: Self-pay

## 2023-10-20 MED ORDER — VENLAFAXINE HCL ER 75 MG PO CP24: ORAL_CAPSULE | ORAL | 0 refills | Status: DC

## 2023-11-23 ENCOUNTER — Encounter: Payer: Self-pay | Admitting: General Practice

## 2023-11-23 ENCOUNTER — Encounter: Payer: Self-pay | Admitting: *Deleted

## 2023-11-23 ENCOUNTER — Ambulatory Visit: Payer: BLUE CROSS/BLUE SHIELD | Admitting: General Practice

## 2023-11-23 VITALS — BP 120/88 | HR 94 | Temp 98.9°F | Ht 59.75 in | Wt 224.0 lb

## 2023-11-23 DIAGNOSIS — K2100 Gastro-esophageal reflux disease with esophagitis, without bleeding: Secondary | ICD-10-CM

## 2023-11-23 DIAGNOSIS — R911 Solitary pulmonary nodule: Secondary | ICD-10-CM | POA: Diagnosis not present

## 2023-11-23 DIAGNOSIS — I10 Essential (primary) hypertension: Secondary | ICD-10-CM

## 2023-11-23 DIAGNOSIS — Z23 Encounter for immunization: Secondary | ICD-10-CM

## 2023-11-23 DIAGNOSIS — H409 Unspecified glaucoma: Secondary | ICD-10-CM | POA: Insufficient documentation

## 2023-11-23 DIAGNOSIS — E785 Hyperlipidemia, unspecified: Secondary | ICD-10-CM

## 2023-11-23 DIAGNOSIS — R7303 Prediabetes: Secondary | ICD-10-CM

## 2023-11-23 DIAGNOSIS — Z7689 Persons encountering health services in other specified circumstances: Secondary | ICD-10-CM | POA: Insufficient documentation

## 2023-11-23 DIAGNOSIS — K589 Irritable bowel syndrome without diarrhea: Secondary | ICD-10-CM

## 2023-11-23 NOTE — Assessment & Plan Note (Signed)
 Recently diagnosed.   She has been taking Tizanidine daily.   She will send a mychart message with the dosage.

## 2023-11-23 NOTE — Patient Instructions (Addendum)
 CT scan has been ordered. Someone will be in touch with you get it scheduled.   Keep BP log at home.   Physical and labs in July.   It was a pleasure meeting you!

## 2023-11-23 NOTE — Assessment & Plan Note (Signed)
 Discussed the importance of monitoring diet and exercise.   Continue ozempic 1 mg once weekly.

## 2023-11-23 NOTE — Assessment & Plan Note (Addendum)
 Incidental finding in 2024.   Repeat CT ordered.

## 2023-11-23 NOTE — Assessment & Plan Note (Signed)
 Controlled.

## 2023-11-23 NOTE — Assessment & Plan Note (Signed)
 Reviewed last hemoglobin A1c.   Improving since she started the ozempic.   Discussed the importance of monitoring diet and exercise.   Will recheck hemoglobin A1c in July.

## 2023-11-23 NOTE — Assessment & Plan Note (Signed)
 Reviewed lipid panel from July, 2024.  Discussed a low fat, low cholesterol diet.   Will recheck lipid panel in July.

## 2023-11-23 NOTE — Progress Notes (Signed)
 New Patient Office Visit  Subjective    Patient ID: Maureen [name], female    DOB: 1956/04/17  Age: 68 y.o. MRN: [id number]  CC:  Chief Complaint  Patient presents with   New Patient (Initial Visit)    Needs follow up CT chest scheduled; nodule was found on last CT and patients wants follow up imaging done.     HPI Maureen [name] is a 68 y.o. female presents to establish care.   Previous PCP/physical/labs: Anda Kraft, NP/Dr. Oneta Rack.   History of pulmonary nodule: patient is a former smoker, quit in 2000. She had a CT chest on 12/26/22 which showed a 0.2 cm posterior left upper lobe pulmonary nodule along the major fissure; three-vessel coronary atherosclerosis; moderate hiatal hernia.   Hypertension: Currently managed on Lisinopril 10 mg once daily and hydrochlorothiazide 12.5 mg once daily She does not check her BP at home unless she feels symptomatic.   IBS/GERD: Currently managed on famotidine 40 mg once daily and pantoprazole 40 mg once daily. She has a history of IBS which is controlled with ozempic. She used to have diarrhea and since starting the ozempic, this has been controlled.   Prediabetes: currently managed on ozempic 1 mg once weekly. She has been doing really well. She has intermittent nausea. She has been trying to monitoring diet. She has been physically active. Her exercise is limited due to multiple knee surgery and chronic back pain. She has lost close to 30 lbs. This has helped her decrease her BP medication. Overall she is feeling so much better with Ozempic. Cholesterol has been managed without statin due to the weight loss.  Glaucoma: Patient states that she was recently started on Tizanidine for early onset glaucoma. She is not sure of the dosage. She denies any concerns today.   Outpatient Encounter Medications as of 11/23/2023  Medication Sig   Cholecalciferol (VITAMIN D-3) 5000 units TABS Take 5,000 Units by mouth daily.   ELDERBERRY PO Take by mouth.    famotidine (PEPCID) 40 MG tablet Take 1 tablet daily for Heart burn & Acid Indigestion   fluticasone (FLONASE) 50 MCG/ACT nasal spray Place 1-2 sprays into both nostrils at bedtime.   hydrochlorothiazide (HYDRODIURIL) 12.5 MG tablet TAKE 1 TABLET BY MOUTH DAILY FOR BLOOD PRESSURE AND FLUID   lisinopril (ZESTRIL) 10 MG tablet Take 1 tablet (10 mg total) by mouth daily.   loratadine (CLARITIN) 10 MG tablet Take 10 mg by mouth every morning.    MAGNESIUM PO Take by mouth.   Multiple Vitamin (MULTIVITAMIN) capsule Take 1 capsule by mouth daily.    OVER THE COUNTER MEDICATION Balance of nature   OZEMPIC, 1 MG/DOSE, 4 MG/3ML SOPN INJECT 1 MG INTO THE SKIN ONCE A WEEK   pantoprazole (PROTONIX) 40 MG tablet TAKE ONE TABLET (40 MG) BY MOUTH EVERY DAY   venlafaxine XR (EFFEXOR-XR) 75 MG 24 hr capsule Take  1 capsule  Daily for Mood                                                    /                                        TAKE  BY                                                   MOUTH                                                ONCE  ? ? ?  DAILY   Zinc 15 MG CAPS Take by mouth.   [DISCONTINUED] famotidine (PEPCID) 40 MG tablet 1 tab(s) orally 2 times a day for 90 days   [DISCONTINUED] valACYclovir (VALTREX) 1000 MG tablet Take 1 tablet (1,000 mg total) by mouth 3 (three) times daily. Take for 7 days   No facility-administered encounter medications on file as of 11/23/2023.    Past Medical History:  Diagnosis Date   Arthritis    LEFT KNEE, LOWER BACK   GERD (gastroesophageal reflux disease)    History of hiatal hernia    History of obstructive sleep apnea    per pt history osa w/ cpap until wt loss after gastric band, retested negative for sleep apnea   Hypertension    IBS (irritable bowel syndrome)    Numbness    BOTTOM LEFT FOOT   PONV (postoperative nausea and vomiting)    PONV with gallbladder    Seasonal allergies    Sinus drainage     Snores    WEARS NIGHT GUARD   SUI (stress urinary incontinence, female)     Past Surgical History:  Procedure Laterality Date   BREAST LUMPECTOMY Left 1972   benign tumor   CATARACT EXTRACTION W/ INTRAOCULAR LENS  IMPLANT, BILATERAL Bilateral 05/2017   COLONOSCOPY     DILATION AND CURETTAGE OF UTERUS  multiple   ESOPHAGOGASTRODUODENOSCOPY     LAPAROSCOPIC ASSISTED VAGINAL HYSTERECTOMY  02-06-2004   dr Seymour Bars   LAPAROSCOPIC CHOLECYSTECTOMY  01-03-2002   dr Lurene Shadow   LAPAROSCOPIC GASTRIC BANDING  11/05/2009   dr Daphine Deutscher   AND REPAIR HIATAL HERNIA   LAPAROSCOPIC GASTRIC BANDING WITH HIATAL HERNIA REPAIR N/A 01/22/2015   Procedure: LAPAROSCOPIC TAKEDOWN AND REVISION OF GASTRIC BAND REVISION WITH UPPER ENDOSCOPY;  Surgeon: Luretha Murphy, MD;  Location: [facility];  Service: General;  Laterality: N/A;   ORIF TOE FRACTURE Left    "little toe"   TOTAL KNEE ARTHROPLASTY Right 02/17/2016   Procedure: TOTAL KNEE ARTHROPLASTY;  Surgeon: Gean Birchwood, MD;  Location: [facility];  Service: Orthopedics;  Laterality: Right;   TOTAL KNEE ARTHROPLASTY Left 07/13/2017   Procedure: TOTAL KNEE ARTHROPLASTY;  Surgeon: Kennedy Bucker, MD;  Location: [facility];  Service: Orthopedics;  Laterality: Left;   TOTAL KNEE REVISION Left 05/18/2018   Procedure: Left total knee arthroplasty revision;  Surgeon: Ollen Gross, MD;  Location: [facility];  Service: Orthopedics;  Laterality: Left;  Adductor Block   TUBAL LIGATION Bilateral yrs ago    Family History  Problem Relation Age of Onset   Hyperlipidemia Father    Heart disease Father    Cancer Paternal Grandfather        unknown    Social History   Socioeconomic History   Marital status: Married    Spouse name: Not on file   Number of children: Not on file   Years of education: Not on file  Highest education level: GED or equivalent  Occupational History   Not on file  Tobacco Use   Smoking status: Former    Current packs/day: 0.00    Average packs/day: 0.3  packs/day for 28.0 years (7.0 ttl pk-yrs)    Types: Cigarettes    Start date: 08/31/1970    Quit date: 08/31/1998    Years since quitting: 25.2   Smokeless tobacco: Never  Vaping Use   Vaping status: Never Used  Substance and Sexual Activity   Alcohol use: No   Drug use: No   Sexual activity: Not Currently    Partners: Male    Birth control/protection: Surgical    Comment: hysterectomy, older than 16, less than 5  Other Topics Concern   Not on file  Social History Narrative   Not on file   Social Drivers of Health   Financial Resource Strain: Low Risk  (11/22/2023)   Overall Financial Resource Strain (CARDIA)    Difficulty of Paying Living Expenses: Not very hard  Food Insecurity: No Food Insecurity (11/22/2023)   Hunger Vital Sign    Worried About Running Out of Food in the Last Year: Never true    Ran Out of Food in the Last Year: Never true  Transportation Needs: No Transportation Needs (11/22/2023)   PRAPARE - Administrator, Civil Service (Medical): No    Lack of Transportation (Non-Medical): No  Physical Activity: Unknown (11/22/2023)   Exercise Vital Sign    Days of Exercise per Week: 0 days    Minutes of Exercise per Session: Not on file  Stress: No Stress Concern Present (11/22/2023)   [employer] of Occupational Health - Occupational Stress Questionnaire    Feeling of Stress : Not at all  Social Connections: Moderately Integrated (11/22/2023)   Social Connection and Isolation Panel [NHANES]    Frequency of Communication with Friends and Family: More than three times a week    Frequency of Social Gatherings with Friends and Family: More than three times a week    Attends Religious Services: Never    Database administrator or Organizations: Yes    Attends Engineer, structural: More than 4 times per year    Marital Status: Married  Catering manager Violence: Not on file    Review of Systems  Constitutional:  Negative for chills and fever.   Respiratory:  Negative for shortness of breath.   Cardiovascular:  Negative for chest pain.  Gastrointestinal:  Negative for abdominal pain, constipation, diarrhea, heartburn, nausea and vomiting.  Genitourinary:  Negative for dysuria, frequency and urgency.  Neurological:  Negative for dizziness and headaches.  Endo/Heme/Allergies:  Negative for polydipsia.  Psychiatric/Behavioral:  Negative for depression and suicidal ideas. The patient is not nervous/anxious.         Objective    BP 120/88 (BP Location: Left Arm, Patient Position: Sitting, Cuff Size: Large)   Pulse 94   Temp 98.9 F (37.2 C) (Oral)   Ht 4' 11.75" (1.518 m)   Wt 224 lb (101.6 kg)   SpO2 96%   BMI 44.11 kg/m   Physical Exam Vitals and nursing note reviewed.  Constitutional:      Appearance: Normal appearance.  Cardiovascular:     Rate and Rhythm: Normal rate and regular rhythm.     Pulses: Normal pulses.     Heart sounds: Normal heart sounds.  Pulmonary:     Effort: Pulmonary effort is normal.     Breath sounds: Normal breath  sounds.  Neurological:     Mental Status: She is alert and oriented to person, place, and time.  Psychiatric:        Mood and Affect: Mood normal.        Behavior: Behavior normal.        Thought Content: Thought content normal.        Judgment: Judgment normal.         Assessment & Plan:  Establishing care with new doctor, encounter for Assessment & Plan: EMR reviewed briefly.   Primary hypertension Assessment & Plan: BP at goal today.   Discussed keeping BP log at home.   Continue lisinopril and hydrochlorothiazide daily.   Pulmonary nodule, left Assessment & Plan: Incidental finding in 2024.   Repeat CT ordered.  Orders: -     CT CHEST WO CONTRAST; Future  Need for shingles vaccine -     Varicella-zoster vaccine IM  Morbid obesity (HCC) Assessment & Plan: Discussed the importance of monitoring diet and exercise.   Continue ozempic 1 mg once  weekly.   Gastroesophageal reflux disease with esophagitis without hemorrhage Assessment & Plan: Controlled.   Continue pepcid and pantoprazole.    Hyperlipidemia, unspecified hyperlipidemia type Assessment & Plan: Reviewed lipid panel from July, 2024.  Discussed a low fat, low cholesterol diet.   Will recheck lipid panel in July.   Glaucoma, unspecified glaucoma type, unspecified laterality Assessment & Plan: Recently diagnosed.   She has been taking Tizanidine daily.   She will send a mychart message with the dosage.    Irritable bowel syndrome, unspecified type Assessment & Plan: Controlled.    Prediabetes Assessment & Plan: Reviewed last hemoglobin A1c.   Improving since she started the ozempic.   Discussed the importance of monitoring diet and exercise.   Will recheck hemoglobin A1c in July.      Return in about 4 months (around 03/24/2024) for physical with fasting labs.[place] Charon, NP

## 2023-11-23 NOTE — Assessment & Plan Note (Signed)
 Controlled.   Continue pepcid and pantoprazole.

## 2023-11-23 NOTE — Assessment & Plan Note (Signed)
 BP at goal today.   Discussed keeping BP log at home.   Continue lisinopril and hydrochlorothiazide daily.

## 2023-11-23 NOTE — Assessment & Plan Note (Signed)
 EMR reviewed briefly.

## 2023-11-24 NOTE — Telephone Encounter (Signed)
 FYI - see medication update in the mychart message response

## 2023-11-24 NOTE — Telephone Encounter (Signed)
Medication has been added.

## 2023-11-26 ENCOUNTER — Telehealth: Payer: Self-pay

## 2023-11-26 ENCOUNTER — Other Ambulatory Visit: Payer: Self-pay | Admitting: General Practice

## 2023-11-26 DIAGNOSIS — I10 Essential (primary) hypertension: Secondary | ICD-10-CM

## 2023-11-26 MED ORDER — PANTOPRAZOLE SODIUM 40 MG PO TBEC: 40.0000 mg | DELAYED_RELEASE_TABLET | Freq: Every day | ORAL | 3 refills | Status: DC

## 2023-11-26 MED ORDER — LISINOPRIL 10 MG PO TABS: 10.0000 mg | ORAL_TABLET | Freq: Every day | ORAL | 1 refills | Status: DC

## 2023-11-26 NOTE — Telephone Encounter (Signed)
 Copied from CRM [id number]. Topic: Clinical - Medication Refill >> Nov 26, 2023  1:45 PM Tiffany S wrote: Most Recent Primary Care Visit:  Provider: [place] Charon  Department: [facility]  Visit Type: NEW PATIENT  Date: 11/23/2023  Medication: lisinopril (ZESTRIL) 10 MG tablet [[id number]] pantoprazole (PROTONIX) 40 MG tablet [[id number]]  Has the patient contacted their pharmacy? Yes (Agent: If no, request that the patient contact the pharmacy for the refill. If patient does not wish to contact the pharmacy document the reason why and proceed with request.) (Agent: If yes, when and what did the pharmacy advise?)  Is this the correct pharmacy for this prescription? Yes If no, delete pharmacy and type the correct one.  This is the patient's preferred pharmacy:  [facility] - [place], [place] - [address] [place] [address] Phone: [phone] Fax: [phone]   Has the prescription been filled recently? Yes  Is the patient out of the medication? Yes  Has the patient been seen for an appointment in the last year OR does the patient have an upcoming appointment? Yes  Can we respond through MyChart? Yes  Agent: Please be advised that Rx refills may take up to 3 business days. We ask that you follow-up with your pharmacy.

## 2023-11-26 NOTE — Telephone Encounter (Signed)
 Copied from CRM [id number]. Topic: General - Other >> Nov 26, 2023  2:54 PM Gurney Maxin H wrote: Reason for CRM: Melody called to state patient has an appointment for a CT on 4/2 andneeds to be pre-certified through insurance [employer] before appointment.  [facility] [phone] Ext [phone]

## 2023-11-30 NOTE — Telephone Encounter (Signed)
 Called Maureen [name] with [facility] [phone] Ext [phone] ) left a detailed message and made her aware that due to delay in insurance approval the patient is rescheduling her appt to next week.

## 2023-11-30 NOTE — Telephone Encounter (Signed)
 CT approval is pending with insurance. See referral order for updates.

## 2023-11-30 NOTE — Telephone Encounter (Signed)
 I called this morning trying to get an update on this referral and was on hold for over . I had to disconnect the call.   Spoke with the patient and she is aware of the issue with the insurance and that her Maureen [name] is still pending approval and will likely not have a final determination until tomorrow at that earliest.   She is going to call and have her CT scan moved out to next week. She is aware that once we get the approval from insurance we can always call and see if they can move it up sooner if that's what the patient wants to do.   She is aware that will keep her posted.

## 2023-12-01 ENCOUNTER — Ambulatory Visit: Admission: RE | Admit: 2023-12-01 | Source: Ambulatory Visit

## 2023-12-03 NOTE — Telephone Encounter (Signed)
 Maureen [name] from patients husbands employers office called to let us know she had the authorization information for patients imaging. She said theres been issues getting this done and wanted to speak with someone about this. I asked her to fax it here but she could not fax. Advised her that Geronimo Running is out of the office this afternoon and I would send a message to see if someone else can handle this; she is aware that it may have to wait until Monday.

## 2023-12-06 ENCOUNTER — Ambulatory Visit

## 2023-12-06 NOTE — Telephone Encounter (Signed)
 Called Maureen [name] at the number listed below, left a VM to return my call.

## 2023-12-07 ENCOUNTER — Encounter: Payer: Self-pay | Admitting: *Deleted

## 2023-12-07 NOTE — Telephone Encounter (Signed)
 Called and LM for Maureen [name] again today  Called patient and she states that she received a letter from her insurance stating that this scan is approved. Pt is scheduled for 12/09/23.   She is going to send the Auth letter via [employer]

## 2023-12-07 NOTE — Telephone Encounter (Signed)
 Auth# [id number] Valid: 11/29/2023-02/28/2024 CPT 71250   Nothing further needed.

## 2023-12-09 ENCOUNTER — Ambulatory Visit
Admission: RE | Admit: 2023-12-09 | Discharge: 2023-12-09 | Disposition: A | Discharge status: Home / Self Care | Source: Ambulatory Visit | Attending: General Practice | Admitting: General Practice

## 2023-12-09 ENCOUNTER — Encounter: Payer: Self-pay | Admitting: General Practice

## 2023-12-09 DIAGNOSIS — R911 Solitary pulmonary nodule: Secondary | ICD-10-CM | POA: Diagnosis present

## 2023-12-16 NOTE — Telephone Encounter (Signed)
 Referral order updated with Auth information  Nothing further needed.

## 2024-01-17 ENCOUNTER — Other Ambulatory Visit: Payer: Self-pay | Admitting: General Practice

## 2024-01-17 MED ORDER — VENLAFAXINE HCL ER 75 MG PO CP24: ORAL_CAPSULE | ORAL | 0 refills | Status: DC

## 2024-01-17 MED ORDER — HYDROCHLOROTHIAZIDE 12.5 MG PO TABS: ORAL_TABLET | ORAL | 1 refills | Status: DC

## 2024-01-17 NOTE — Telephone Encounter (Signed)
 Copied from CRM [id number]. Topic: Clinical - Medication Refill >> Jan 17, 2024 12:11 PM Trula Gable C wrote: Medication: hydrochlorothiazide  (HYDRODIURIL ) 12.5 MG tablet venlafaxine  XR (EFFEXOR -XR) 75 MG 24 hr capsule  Has the patient contacted their pharmacy? Yes (Agent: If no, request that the patient contact the pharmacy for the refill. If patient does not wish to contact the pharmacy document the reason why and proceed with request.) (Agent: If yes, when and what did the pharmacy advise?)  This is the patient's preferred pharmacy:  [facility] - [place], [place] - [address] [place] [address] Phone: [phone] Fax: [phone]  Is this the correct pharmacy for this prescription? Yes If no, delete pharmacy and type the correct one.   Has the prescription been filled recently? No  Is the patient out of the medication? Yes  Has the patient been seen for an appointment in the last year OR does the patient have an upcoming appointment? Yes  Can we respond through MyChart? Yes  Agent: Please be advised that Rx refills may take up to 3 business days. We ask that you follow-up with your pharmacy.

## 2024-02-07 LAB — HM MAMMOGRAPHY

## 2024-03-15 ENCOUNTER — Encounter: Payer: BLUE CROSS/BLUE SHIELD | Admitting: Nurse Practitioner

## 2024-03-24 ENCOUNTER — Encounter: Admitting: General Practice

## 2024-04-03 ENCOUNTER — Encounter: Payer: Self-pay | Admitting: General Practice

## 2024-04-03 ENCOUNTER — Ambulatory Visit (INDEPENDENT_AMBULATORY_CARE_PROVIDER_SITE_OTHER): Admitting: General Practice

## 2024-04-03 VITALS — BP 118/78 | HR 82 | Temp 98.2°F | Ht 60.0 in | Wt 227.0 lb

## 2024-04-03 DIAGNOSIS — Z780 Asymptomatic menopausal state: Secondary | ICD-10-CM

## 2024-04-03 DIAGNOSIS — M65312 Trigger thumb, left thumb: Secondary | ICD-10-CM | POA: Insufficient documentation

## 2024-04-03 DIAGNOSIS — Z0000 Encounter for general adult medical examination without abnormal findings: Secondary | ICD-10-CM | POA: Insufficient documentation

## 2024-04-03 DIAGNOSIS — E785 Hyperlipidemia, unspecified: Secondary | ICD-10-CM

## 2024-04-03 DIAGNOSIS — Z1329 Encounter for screening for other suspected endocrine disorder: Secondary | ICD-10-CM

## 2024-04-03 DIAGNOSIS — Z23 Encounter for immunization: Secondary | ICD-10-CM | POA: Diagnosis not present

## 2024-04-03 DIAGNOSIS — R7303 Prediabetes: Secondary | ICD-10-CM | POA: Diagnosis not present

## 2024-04-03 DIAGNOSIS — I10 Essential (primary) hypertension: Secondary | ICD-10-CM

## 2024-04-03 NOTE — Assessment & Plan Note (Signed)
 TSH pending.

## 2024-04-03 NOTE — Progress Notes (Signed)
 Established Patient Office Visit  Subjective   Patient ID: Maureen [name], female    DOB: 1956/07/06  Age: 68 y.o. MRN: [id number]  Chief Complaint  Patient presents with   Annual Exam    Patient had no fasted today; had protein bar, unsweet tea and black coffee. Needs second shingles vaccine today.   trigger finger    In left hand x 3 months. Patient states its worse in the mornings; comes and goes. Patient sometimes can massage it to get it to calm down but starting to get painful.     HPI  Maureen [name] is a 68 year old female with past medical history of HTN, aortic atherosclerosis, left pulmonary nodule, IBS, GERD, HLD, OA, morbid obesity, prediabetes presents today for complete physical and follow up of chronic conditions.  Immunizations: -Tetanus: Completed in 2017 -Shingles: Completed 1 dose of shingrix , due for 2nd one. -Pneumonia: Completed   Diet: Fair diet.  Exercise: regular exercise.  Eye exam: Completes annually  Dental exam: Completes semi-annually    Mammogram: Completed in 2025 Bone Density Scan: Completed in 2021; due  Colonoscopy: Completed in 2022  Trigger finger- left thumb. Onset three months. Worse in the morning. Pain. Causing limitations in opening door or holding things. She feels that there is a knot at the base of her thumb. No erythema. Pain is intermittent but worse in the morning. She does not overuse her left hand. She denies any known injury. Overall her symptoms are improving.   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   History of total knee replacement, left 02/10/2018   Primary localized osteoarthritis of left knee 07/13/2017   Primary osteoarthritis of right knee 02/16/2016    Female genuine stress incontinence 10/29/2015   Arthritis, degenerative 10/29/2015   Allergic rhinitis 10/29/2015   Prediabetes 12/03/2014   Hyperlipidemia 12/03/2014   Morbid obesity (HCC) 12/03/2014   Vitamin D  deficiency 12/03/2014   Thyroid  disorder screening 12/03/2014   IBS (irritable bowel syndrome)    GERD (gastroesophageal reflux disease)    Hypertension    LAP-BAND surgery status 01/17/2013   Past Medical History:  Diagnosis Date   Arthritis    LEFT KNEE, LOWER BACK   GERD (gastroesophageal reflux disease)    History of hiatal hernia    History of obstructive sleep apnea    per pt history osa w/ cpap until wt loss after gastric band, retested negative for sleep apnea   Hypertension    IBS (irritable bowel syndrome)    Numbness    BOTTOM LEFT FOOT   PONV (postoperative nausea and vomiting)    PONV with gallbladder    Seasonal allergies    Sinus drainage    Snores    WEARS NIGHT GUARD   SUI (stress urinary incontinence, female)    Past Surgical History:  Procedure Laterality Date   BREAST LUMPECTOMY Left 1972   benign tumor   CATARACT EXTRACTION W/ INTRAOCULAR LENS  IMPLANT, BILATERAL Bilateral 05/2017   COLONOSCOPY     DILATION AND CURETTAGE OF UTERUS  multiple   ESOPHAGOGASTRODUODENOSCOPY     LAPAROSCOPIC ASSISTED VAGINAL HYSTERECTOMY  02-06-2004   dr lavoie   LAPAROSCOPIC CHOLECYSTECTOMY  01-03-2002   dr adel   LAPAROSCOPIC GASTRIC BANDING  11/05/2009   dr gladis   AND REPAIR HIATAL HERNIA   LAPAROSCOPIC GASTRIC BANDING WITH HIATAL HERNIA REPAIR N/A 01/22/2015   Procedure: LAPAROSCOPIC TAKEDOWN AND REVISION OF GASTRIC BAND REVISION WITH UPPER ENDOSCOPY;  Surgeon: Donnice gladis, MD;  Location: [facility];  Service: General;  Laterality: N/A;   ORIF TOE FRACTURE Left    little toe   TOTAL KNEE ARTHROPLASTY Right 02/17/2016   Procedure: TOTAL KNEE ARTHROPLASTY;  Surgeon: Dempsey Sensor, MD;  Location: [facility];  Service: Orthopedics;  Laterality: Right;   TOTAL KNEE  ARTHROPLASTY Left 07/13/2017   Procedure: TOTAL KNEE ARTHROPLASTY;  Surgeon: Kathlynn Sharper, MD;  Location: [facility];  Service: Orthopedics;  Laterality: Left;   TOTAL KNEE REVISION Left 05/18/2018   Procedure: Left total knee arthroplasty revision;  Surgeon: Melodi Dempsey, MD;  Location: [facility];  Service: Orthopedics;  Laterality: Left;  Adductor Block   TUBAL LIGATION Bilateral yrs ago   Allergies  Allergen Reactions   Mucinex [Guaifenesin Er] Other (See Comments)    feels weird and dizzy   Tramadol Itching         04/03/2024   10:39 AM 11/23/2023   10:34 AM 09/14/2022   11:11 AM  Depression screen PHQ 2/9  Decreased Interest 0 0 0  Down, Depressed, Hopeless 0 0 0  PHQ - 2 Score 0 0 0  Altered sleeping 0 0   Tired, decreased energy 0 0   Change in appetite 0 0   Feeling bad or failure about yourself  0 0   Trouble concentrating 0 0   Moving slowly or fidgety/restless 0 0   Suicidal thoughts 0 0   PHQ-9 Score 0 0   Difficult doing work/chores Not difficult at all Not difficult at all        04/03/2024   10:40 AM 11/23/2023   10:34 AM  GAD 7 : Generalized Anxiety Score  Nervous, Anxious, on Edge 0 0  Control/stop worrying 0 0  Worry too much - different things 0 0  Trouble relaxing 0 0  Restless 0 0  Easily annoyed or irritable 0 0  Afraid - awful might happen 0 0  Total GAD 7 Score 0 0  Anxiety Difficulty Not difficult at all Not difficult at all      Review of Systems  Constitutional:  Negative for chills, fever, malaise/fatigue and weight loss.  HENT:  Negative for congestion, ear discharge, ear pain, hearing loss, nosebleeds, sinus pain, sore throat and tinnitus.   Eyes:  Negative for blurred vision, double vision, pain, discharge and redness.  Respiratory:  Negative for cough, shortness of breath, wheezing and stridor.   Cardiovascular:  Negative for chest pain, palpitations and leg swelling.  Gastrointestinal:  Negative for abdominal pain, constipation,  diarrhea, heartburn, nausea and vomiting.  Genitourinary:  Negative for dysuria, frequency and urgency.  Musculoskeletal:  Negative for myalgias.  Skin:  Negative for rash.  Neurological:  Negative for dizziness, tingling, seizures, weakness and headaches.  Psychiatric/Behavioral:  Negative for depression, substance abuse and suicidal ideas. The patient is not nervous/anxious.       Objective:     BP 118/78   Pulse 82   Temp 98.2 F (36.8 C) (Oral)   Ht 5' (1.524 m)   Wt 227 lb (103 kg)   SpO2 98%   BMI 44.33 kg/m  BP Readings from Last 3 Encounters:  04/03/24 118/78  11/23/23 120/88  03/16/23 (!) 130/90   Wt Readings from Last 3 Encounters:  04/03/24 227 lb (103 kg)  11/23/23 224 lb (101.6 kg)  03/16/23 222 lb 12.8 oz (101.1 kg)      Physical Exam Vitals and nursing note reviewed.  Constitutional:      Appearance: Normal appearance.  HENT:     Head: Normocephalic and atraumatic.     Right Ear: Tympanic membrane, ear canal and external ear normal.     Left Ear: Tympanic membrane, ear canal and external ear normal.     Nose: Nose normal.     Mouth/Throat:     Mouth: Mucous membranes are moist.     Pharynx: Oropharynx is clear.  Eyes:     Conjunctiva/sclera: Conjunctivae normal.     Pupils: Pupils are equal, round, and reactive to light.  Cardiovascular:     Rate and Rhythm: Normal rate and regular rhythm.     Pulses: Normal pulses.     Heart sounds: Normal heart sounds.  Pulmonary:     Effort: Pulmonary effort is normal.     Breath sounds: Normal breath sounds.  Abdominal:     General: Abdomen is flat. Bowel sounds are normal.     Palpations: Abdomen is soft.  Musculoskeletal:        General: Normal range of motion.     Right hand: No swelling.     Left hand: No swelling.       Hands:     Cervical back: Normal range of motion.     Comments: Left hand first digit (thumb); knot present on exam at the base of her thumb  Skin:    General: Skin is warm and  dry.     Capillary Refill: Capillary refill takes less than 2 seconds.  Neurological:     General: No focal deficit present.     Mental Status: She is alert and oriented to person, place, and time. Mental status is at baseline.  Psychiatric:        Mood and Affect: Mood normal.        Behavior: Behavior normal.        Thought Content: Thought content normal.        Judgment: Judgment normal.     No results found for any visits on 04/03/24.    The 10-year ASCVD risk score (Arnett DK, et al., 2019) is: 9.5%    Assessment & Plan:  Encounter for screening and preventative care Assessment & Plan: Immunizations UTD. 2nd shingles vaccine given today. Mammogram UTD, bone density due- orders placed. Colonoscopy UTD  Discussed the importance of a healthy diet and regular exercise in order for weight loss, and to reduce the risk of further co-morbidity.  Exam stable. Labs pending.  Follow up in 1 year for repeat physical.   Orders: -     CBC; Future -     Comprehensive metabolic panel with GFR; Future -     Lipid panel; Future -     TSH; Future -     Hemoglobin A1c; Future  Postmenopausal -     DG Bone Density; Future  Prediabetes Assessment & Plan: Hemoglobin A1c pending.   Primary hypertension Assessment & Plan: BP at goal.  Continue lisinopril  and hydrochlorothiazide  daily.  Orders: -     CBC; Future -     Comprehensive metabolic panel with GFR; Future -     Hemoglobin A1c; Future  Hyperlipidemia, unspecified hyperlipidemia type Assessment & Plan: Lipid panel pending.  Discussed to continue a low fat, low cholesterol diet.  Orders: -  Lipid panel; Future  Thyroid  disorder screening Assessment & Plan: TSH pending.  Orders: -     TSH; Future  Need for shingles vaccine -     Varicella-zoster vaccine IM  Trigger finger of left thumb Assessment & Plan: Improving.  Small knot present on exam.  Discussed rest and decrease use of the  finger. Discussed to use splint at night and use anti-inflammatory like ibuprofen  or aleve as needed.   She will update in 2-3 weeks. If not better, consider trigger point injection with orthopedic.     Return in about 3 months (around 07/04/2024) for chronic care management.SABRA Carrol Aurora, NP

## 2024-04-03 NOTE — Assessment & Plan Note (Signed)
 Improving.  Small knot present on exam.  Discussed rest and decrease use of the finger. Discussed to use splint at night and use anti-inflammatory like ibuprofen  or aleve as needed.   She will update in 2-3 weeks. If not better, consider trigger point injection with orthopedic.

## 2024-04-03 NOTE — Assessment & Plan Note (Signed)
 Hemoglobin A1c pending

## 2024-04-03 NOTE — Assessment & Plan Note (Signed)
 BP at goal.  Continue lisinopril  and hydrochlorothiazide  daily.

## 2024-04-03 NOTE — Patient Instructions (Addendum)
 Please make lab appointment for later this week for fasting labs.   Trigger finger- rest your thumb. You can use a splint from [employer] or [employer]. Ok to use over the counter ibuprofen  or aleve to help with the pain. Update me in 2-3 weeks and let me know if you are not better.  F/u in 3 months in the office.  It was a pleasure to see you today!  You have an order for:  []   2D Mammogram  []   3D Mammogram  [x]   Bone Density     Please call for appointment:  [facility]  [address] [place] [address] [phone] [facility] [address] [place], [place] [address] [phone] [facility] [address] [place], [place] [address] [phone]   Make sure to wear two-piece clothing.  No lotions, powders, or deodorants the day of the appointment. Make sure to bring picture ID and insurance card.  Bring list of medications you are currently taking including any supplements.   Schedule your [place] screening mammogram through MyChart!   Log into your MyChart account.  Go to 'Visit' (or 'Appointments' if on mobile App) --> Schedule an Appointment  Under 'Select a Reason for Visit' choose the Mammogram Screening option.  Complete the pre-visit questions and select the time and place that best fits your schedule.

## 2024-04-03 NOTE — Assessment & Plan Note (Signed)
 Immunizations UTD. 2nd shingles vaccine given today. Mammogram UTD, bone density due- orders placed. Colonoscopy UTD  Discussed the importance of a healthy diet and regular exercise in order for weight loss, and to reduce the risk of further co-morbidity.  Exam stable. Labs pending.  Follow up in 1 year for repeat physical.

## 2024-04-03 NOTE — Assessment & Plan Note (Signed)
 Lipid panel pending.  Discussed to continue a low fat, low cholesterol diet.

## 2024-04-05 ENCOUNTER — Other Ambulatory Visit (INDEPENDENT_AMBULATORY_CARE_PROVIDER_SITE_OTHER)

## 2024-04-05 ENCOUNTER — Ambulatory Visit: Payer: Self-pay | Admitting: General Practice

## 2024-04-05 DIAGNOSIS — E785 Hyperlipidemia, unspecified: Secondary | ICD-10-CM | POA: Diagnosis not present

## 2024-04-05 DIAGNOSIS — Z0000 Encounter for general adult medical examination without abnormal findings: Secondary | ICD-10-CM

## 2024-04-05 DIAGNOSIS — Z1329 Encounter for screening for other suspected endocrine disorder: Secondary | ICD-10-CM

## 2024-04-05 DIAGNOSIS — I10 Essential (primary) hypertension: Secondary | ICD-10-CM | POA: Diagnosis not present

## 2024-04-05 DIAGNOSIS — Z780 Asymptomatic menopausal state: Secondary | ICD-10-CM

## 2024-04-05 LAB — CBC
HCT: 38 % (ref 36.0–46.0)
Hemoglobin: 12.4 g/dL (ref 12.0–15.0)
MCHC: 32.5 g/dL (ref 30.0–36.0)
MCV: 87.3 fl (ref 78.0–100.0)
Platelets: 305 K/uL (ref 150.0–400.0)
RBC: 4.36 Mil/uL (ref 3.87–5.11)
RDW: 16.2 % — ABNORMAL HIGH (ref 11.5–15.5)
WBC: 5.3 K/uL (ref 4.0–10.5)

## 2024-04-05 LAB — COMPREHENSIVE METABOLIC PANEL WITH GFR
ALT: 17 U/L (ref 0–35)
AST: 16 U/L (ref 0–37)
Albumin: 4 g/dL (ref 3.5–5.2)
Alkaline Phosphatase: 91 U/L (ref 39–117)
BUN: 13 mg/dL (ref 6–23)
CO2: 31 meq/L (ref 19–32)
Calcium: 9.5 mg/dL (ref 8.4–10.5)
Chloride: 101 meq/L (ref 96–112)
Creatinine, Ser: 0.59 mg/dL (ref 0.40–1.20)
GFR: 91.96 mL/min (ref >60.00)
Glucose, Bld: 101 mg/dL — ABNORMAL HIGH (ref 70–99)
Potassium: 4.2 meq/L (ref 3.5–5.1)
Sodium: 139 meq/L (ref 135–145)
Total Bilirubin: 0.4 mg/dL (ref 0.2–1.2)
Total Protein: 6.6 g/dL (ref 6.0–8.3)

## 2024-04-05 LAB — LIPID PANEL
Cholesterol: 179 mg/dL (ref 0–200)
HDL: 70.7 mg/dL (ref >39.00)
LDL Cholesterol: 74 mg/dL (ref 0–99)
NonHDL: 107.91
Total CHOL/HDL Ratio: 3
Triglycerides: 171 mg/dL — ABNORMAL HIGH (ref 0.0–149.0)
VLDL: 34.2 mg/dL (ref 0.0–40.0)

## 2024-04-05 LAB — HEMOGLOBIN A1C: Hgb A1c MFr Bld: 5.9 % (ref 4.6–6.5)

## 2024-04-05 LAB — TSH: TSH: 1.88 u[IU]/mL (ref 0.35–5.50)

## 2024-04-17 ENCOUNTER — Other Ambulatory Visit: Payer: Self-pay | Admitting: General Practice

## 2024-04-28 ENCOUNTER — Other Ambulatory Visit: Payer: Self-pay | Admitting: General Practice

## 2024-05-03 ENCOUNTER — Ambulatory Visit
Admission: RE | Admit: 2024-05-03 | Discharge: 2024-05-03 | Disposition: A | Discharge status: Home / Self Care | Source: Ambulatory Visit | Attending: General Practice | Admitting: General Practice

## 2024-05-03 DIAGNOSIS — Z780 Asymptomatic menopausal state: Secondary | ICD-10-CM | POA: Diagnosis present

## 2024-06-07 ENCOUNTER — Other Ambulatory Visit: Payer: Self-pay | Admitting: General Practice

## 2024-06-07 NOTE — Telephone Encounter (Signed)
 Copied from CRM [id number]. Topic: Clinical - Medication Refill >> Jun 07, 2024  1:46 PM Taleah C wrote: Medication: protonix   Has the patient contacted their pharmacy? Yes They claimed they have been reaching out to the office  This is the patient's preferred pharmacy:   [facility] - [place], [place] - [address] [place] [address] Phone: [phone] Fax: [phone]  Is this the correct pharmacy for this prescription? Yes If no, delete pharmacy and type the correct one.   Has the prescription been filled recently? Yes  Is the patient out of the medication? Yes  Has the patient been seen for an appointment in the last year OR does the patient have an upcoming appointment? Yes  Can we respond through MyChart? Yes  Agent: Please be advised that Rx refills may take up to 3 business days. We ask that you follow-up with your pharmacy.

## 2024-07-04 ENCOUNTER — Encounter: Payer: Self-pay | Admitting: General Practice

## 2024-07-04 ENCOUNTER — Ambulatory Visit
Admission: RE | Admit: 2024-07-04 | Discharge: 2024-07-04 | Disposition: A | Source: Ambulatory Visit | Attending: General Practice | Admitting: General Practice

## 2024-07-04 ENCOUNTER — Ambulatory Visit: Admitting: General Practice

## 2024-07-04 VITALS — BP 124/78 | HR 76 | Temp 98.0°F | Ht 60.0 in | Wt 224.0 lb

## 2024-07-04 DIAGNOSIS — I10 Essential (primary) hypertension: Secondary | ICD-10-CM | POA: Diagnosis not present

## 2024-07-04 DIAGNOSIS — E785 Hyperlipidemia, unspecified: Secondary | ICD-10-CM | POA: Diagnosis not present

## 2024-07-04 DIAGNOSIS — W19XXXA Unspecified fall, initial encounter: Secondary | ICD-10-CM | POA: Insufficient documentation

## 2024-07-04 DIAGNOSIS — R7303 Prediabetes: Secondary | ICD-10-CM | POA: Diagnosis not present

## 2024-07-04 DIAGNOSIS — M25532 Pain in left wrist: Secondary | ICD-10-CM | POA: Diagnosis not present

## 2024-07-04 MED ORDER — FLUTICASONE PROPIONATE 50 MCG/ACT NA SUSP
1.0000 | Freq: Every day | NASAL | 3 refills | Status: AC
Start: 2024-07-04 — End: ?

## 2024-07-04 NOTE — Progress Notes (Signed)
 Established Patient Office Visit  Subjective   Patient ID: Maureen [name], female    DOB: 11-Jul-1956  Age: 68 y.o. MRN: [id number]  Chief Complaint  Patient presents with   chronic care management    Patient here today to f/u on chronic conditions    Fall    On Sept 5th on her left hand and arm and has had more pain then usual. Patient states she can move it but is very sore. Patient states she had trigger finger when she was here last but that is better. Patient states her arm/wrist is swollen some as well. She has been taking ibuprofen  as needed for the pain.     Fall Pertinent negatives include no abdominal pain, fever, headaches, nausea or vomiting.    Maureen [name] is a 68 year old female with past medical history of HTN, aortic atherosclerosis, left pulmonary nodule, IBS, GERD, OA, Lap-band surgery, HLD, prediabetes, obesity, presents today for chronic care management.   Discussed the use of AI scribe software for clinical note transcription with the patient, who gave verbal consent to proceed.  History of Present Illness Maureen [name] is a 68 year old female who presents with left wrist and arm pain following a fall.  She experienced a fall on September 5th, which she attributes to her left knee replacement. Her left leg is slightly shorter than the right, causing her to catch her toe and fall, especially when walking quickly, such as in an airport. During the fall, she landed on her left hand and arm.  Since the fall, she has been experiencing pain in her left wrist and arm. The pain radiates from her wrist up to her forearm, stopping before the elbow. She describes the pain as persistent, with some days being worse than others, and notes that it is always sore. The pain also wakes her up at night. Her wrist sometimes becomes stiff, making it difficult to move initially, although she can eventually move it. She is unable to put pressure on it, such as when getting out of bed.  Initially, she experienced little pain, but it worsened over time. She notes a specific area of discomfort in the wrist, which feels 'really weird'. She has tried Ibuprofen  with minimal relief.  Her current medications include lisinopril  10 mg once daily and hydrochlorothiazide  12.5 mg once daily for high blood pressure, which she monitors regularly and reports as stable.   She also uses Flonase  for allergies and needs a refill.   She is currently managed on Protonix  and Pepcid  for GERD.   HLD- monitoring with diet and exercise. Does have a history of aortic atherosclerosis.   She is managing her prediabetes with diet and exercise, and she uses Ozempic  for weight loss.    Patient Active Problem List   Diagnosis Date Noted   Fall 07/04/2024   Encounter for screening and preventative care 04/03/2024   Trigger finger of left thumb 04/03/2024   Pulmonary nodule, left 11/23/2023   Glaucoma 11/23/2023   Aortic atherosclerosis 12/27/2022   Failed total knee arthroplasty 05/18/2018   History of total knee replacement, left 02/10/2018   Primary localized osteoarthritis of left knee 07/13/2017   Primary osteoarthritis of right knee 02/16/2016   Female genuine stress incontinence 10/29/2015   Arthritis, degenerative 10/29/2015   Allergic rhinitis 10/29/2015   Prediabetes 12/03/2014   Hyperlipidemia 12/03/2014   Morbid obesity (HCC) 12/03/2014   Thyroid  disorder screening 12/03/2014   IBS (irritable bowel syndrome)  GERD (gastroesophageal reflux disease)    Hypertension    LAP-BAND surgery status 01/17/2013   Past Medical History:  Diagnosis Date   Arthritis    LEFT KNEE, LOWER BACK   GERD (gastroesophageal reflux disease)    History of hiatal hernia    History of obstructive sleep apnea    per pt history osa w/ cpap until wt loss after gastric band, retested negative for sleep apnea   Hypertension    IBS (irritable bowel syndrome)    Numbness    BOTTOM LEFT FOOT   PONV  (postoperative nausea and vomiting)    PONV with gallbladder    Seasonal allergies    Sinus drainage    Snores    WEARS NIGHT GUARD   SUI (stress urinary incontinence, female)    Past Surgical History:  Procedure Laterality Date   BREAST LUMPECTOMY Left 1972   benign tumor   CATARACT EXTRACTION W/ INTRAOCULAR LENS  IMPLANT, BILATERAL Bilateral 05/2017   COLONOSCOPY     DILATION AND CURETTAGE OF UTERUS  multiple   ESOPHAGOGASTRODUODENOSCOPY     LAPAROSCOPIC ASSISTED VAGINAL HYSTERECTOMY  02-06-2004   dr georgia   LAPAROSCOPIC CHOLECYSTECTOMY  01-03-2002   dr adel   LAPAROSCOPIC GASTRIC BANDING  11/05/2009   dr gladis   AND REPAIR HIATAL HERNIA   LAPAROSCOPIC GASTRIC BANDING WITH HIATAL HERNIA REPAIR N/A 01/22/2015   Procedure: LAPAROSCOPIC TAKEDOWN AND REVISION OF GASTRIC BAND REVISION WITH UPPER ENDOSCOPY;  Surgeon: Donnice Gladis, MD;  Location: [facility];  Service: General;  Laterality: N/A;   ORIF TOE FRACTURE Left    little toe   TOTAL KNEE ARTHROPLASTY Right 02/17/2016   Procedure: TOTAL KNEE ARTHROPLASTY;  Surgeon: Dempsey Sensor, MD;  Location: [facility];  Service: Orthopedics;  Laterality: Right;   TOTAL KNEE ARTHROPLASTY Left 07/13/2017   Procedure: TOTAL KNEE ARTHROPLASTY;  Surgeon: Kathlynn Sharper, MD;  Location: [facility];  Service: Orthopedics;  Laterality: Left;   TOTAL KNEE REVISION Left 05/18/2018   Procedure: Left total knee arthroplasty revision;  Surgeon: Melodi Dempsey, MD;  Location: [facility];  Service: Orthopedics;  Laterality: Left;  Adductor Block   TUBAL LIGATION Bilateral yrs ago   Allergies  Allergen Reactions   Mucinex [Guaifenesin Er] Other (See Comments)    feels weird and dizzy   Tramadol Itching         07/04/2024    9:03 AM 04/03/2024   10:39 AM 11/23/2023   10:34 AM  Depression screen PHQ 2/9  Decreased Interest 0 0 0  Down, Depressed, Hopeless 0 0 0  PHQ - 2 Score 0 0 0  Altered sleeping 0 0 0  Tired, decreased energy 0 0 0  Change in appetite 0 0 0   Feeling bad or failure about yourself  0 0 0  Trouble concentrating 0 0 0  Moving slowly or fidgety/restless 0 0 0  Suicidal thoughts 0 0 0  PHQ-9 Score 0 0 0  Difficult doing work/chores Not difficult at all Not difficult at all Not difficult at all       07/04/2024    9:03 AM 04/03/2024   10:40 AM 11/23/2023   10:34 AM  GAD 7 : Generalized Anxiety Score  Nervous, Anxious, on Edge 0 0 0  Control/stop worrying 0 0 0  Worry too much - different things 0 0 0  Trouble relaxing 0 0 0  Restless 0 0 0  Easily annoyed or irritable 0 0 0  Afraid - awful might happen 0  0 0  Total GAD 7 Score 0 0 0  Anxiety Difficulty Not difficult at all Not difficult at all Not difficult at all      Review of Systems  Constitutional:  Negative for chills and fever.  Respiratory:  Negative for shortness of breath.   Cardiovascular:  Negative for chest pain.  Gastrointestinal:  Negative for abdominal pain, constipation, diarrhea, heartburn, nausea and vomiting.  Genitourinary:  Negative for dysuria, frequency and urgency.  Musculoskeletal:  Positive for joint pain.  Neurological:  Negative for dizziness and headaches.  Endo/Heme/Allergies:  Negative for polydipsia.  Psychiatric/Behavioral:  Negative for depression and suicidal ideas. The patient is not nervous/anxious.       Objective:     BP 124/78   Pulse 76   Temp 98 F (36.7 C) (Temporal)   Ht 5' (1.524 m)   Wt 224 lb (101.6 kg)   SpO2 99%   BMI 43.75 kg/m  BP Readings from Last 3 Encounters:  07/04/24 124/78  04/03/24 118/78  11/23/23 120/88   Wt Readings from Last 3 Encounters:  07/04/24 224 lb (101.6 kg)  04/03/24 227 lb (103 kg)  11/23/23 224 lb (101.6 kg)      Physical Exam Vitals and nursing note reviewed.  Constitutional:      Appearance: Normal appearance.  Cardiovascular:     Rate and Rhythm: Normal rate and regular rhythm.     Pulses: Normal pulses.     Heart sounds: Normal heart sounds.  Pulmonary:     Effort:  Pulmonary effort is normal.     Breath sounds: Normal breath sounds.  Musculoskeletal:     Right hand: Normal range of motion.     Left hand: Decreased range of motion.  Neurological:     Mental Status: She is alert and oriented to person, place, and time.  Psychiatric:        Mood and Affect: Mood normal.        Behavior: Behavior normal.        Thought Content: Thought content normal.        Judgment: Judgment normal.      No results found for any visits on 07/04/24.     The 10-year ASCVD risk score (Arnett DK, et al., 2019) is: 9.7%    Assessment & Plan:  Primary hypertension  Prediabetes  Hyperlipidemia, unspecified hyperlipidemia type  Morbid obesity (HCC)  Fall, initial encounter -     DG Wrist Complete Left  Left wrist pain -     DG Wrist Complete Left  Other orders -     Fluticasone  Propionate; Place 1-2 sprays into both nostrils at bedtime.  Dispense: 48 g; Refill: 3    Assessment and Plan Assessment & Plan Pain and swelling of left wrist and hand after fall Pain and swelling in the left wrist and hand post-fall with stiffness and radiating pain. Differential includes arthritis, inflammation, or hairline fracture. - x-ray pending.  - continue Ibuprofen  and ice.  - await results.  Essential hypertension Blood pressure well-controlled with current medication. Home monitoring shows normal readings. - Continue lisinopril  10 mg once daily. - Continue hydrochlorothiazide  12.5 mg once daily.  Aortic atherosclerosis Incidental aortic atherosclerosis on CT, asymptomatic with good cholesterol levels. No statin due to adequate control through lifestyle. - Monitor for symptoms such as chest pain. - Continue diet and exercise for cholesterol management.  Prediabetes A1c stable at 5.9% for two years. - Continue monitoring diet and exercise.  Morbid obesity due to  excess calories On Ozempic  for weight loss management. -Discussed the importance of a healthy  diet and regular exercise in order for weight loss, and to reduce the risk of further co-morbidity.   Gastroesophageal reflux disease GERD managed with Protonix  and Pepcid . - Continue Protonix  and Pepcid  as needed.    Return in about 3 months (around 10/04/2024) for chronic care management.SABRA Carrol Aurora, NP

## 2024-07-04 NOTE — Patient Instructions (Signed)
 Complete xray(s) prior to leaving today. I will notify you of your results once received.   Follow up in 3 months.   It was a pleasure to see you today!

## 2024-07-06 ENCOUNTER — Ambulatory Visit: Payer: Self-pay | Admitting: General Practice

## 2024-07-10 ENCOUNTER — Other Ambulatory Visit: Payer: Self-pay | Admitting: General Practice

## 2024-07-10 NOTE — Telephone Encounter (Unsigned)
 Copied from CRM [id number]. Topic: Clinical - Medication Refill >> Jul 10, 2024  4:22 PM China J wrote: Medication: famotidine  (PEPCID ) 40 MG tablet  Has the patient contacted their pharmacy? Yes (Agent: If no, request that the patient contact the pharmacy for the refill. If patient does not wish to contact the pharmacy document the reason why and proceed with request.) (Agent: If yes, when and what did the pharmacy advise?) Patient let pharmacy know that a refill was needed and she was told by her pharmacy that they will request the medication but never did.  This is the patient's preferred pharmacy:  [facility] - [place], [place] - [address] [place] [address] Phone: [phone] Fax: [phone]  Is this the correct pharmacy for this prescription? Yes If no, delete pharmacy and type the correct one.   Has the prescription been filled recently? No  Is the patient out of the medication? Yes  Has the patient been seen for an appointment in the last year OR does the patient have an upcoming appointment? Yes  Can we respond through MyChart? Yes  Agent: Please be advised that Rx refills may take up to 3 business days. We ask that you follow-up with your pharmacy.

## 2024-07-11 MED ORDER — FAMOTIDINE 40 MG PO TABS: ORAL_TABLET | ORAL | 3 refills | Status: AC

## 2024-07-16 NOTE — Progress Notes (Signed)
 "    Maureen Dirocco T. Maureen Yarbrough, MD, CAQ Sports Medicine [name] [address] [place], [address]  Phone: [phone]  FAX: [phone]  LETECIA ARPS - 68 y.o. female  MRN [id number]  Date of Birth: 1956/03/18  Date: 07/17/2024  PCP: Vincente Shivers, NP  Referral: Vincente Shivers, NP  Chief Complaint  Patient presents with   Wrist Pain    Left-Injection   Subjective:   Maureen [name] is a 68 y.o. very pleasant female patient with Body mass index is 43.75 kg/m. who presents with the following:  Discussed the use of AI scribe software for clinical note transcription with the patient, who gave verbal consent to proceed.  Patient presents with some ongoing wrist pain and probable arthritis.  Radiographs from July 04, 2024 were reviewed and she has moderate arthritis at the basal joint on the left.  Moderate wrist arthritis.  History of Present Illness Maureen [name] is a 69 year old female with osteoarthritis who presents with left wrist pain and swelling.  She has experienced persistent pain and swelling in her left wrist [place], approximately two months ago. The pain is constant, primarily located in the wrist, and radiates to the hand and thumb. - Radiographs are reviewed, and there is no fracture.  She uses a wrist splint primarily at night, which she finds helpful, but it is inconvenient during the day due to her active use of her hands and frequent hand washing.  Significant pain occurs when turning the wrist or holding objects, with certain movements like pushing exacerbating the pain. She also has a history of trigger thumb, which still causes some discomfort, although it does not catch or pop.  She takes ibuprofen  to manage the pain, usually three tablets in the morning, and occasionally more throughout the day, but she tries to limit its use. She has not used topical treatments. During the  review of symptoms, she reports pain primarily in the left wrist and thumb, with swelling. She is right-handed, which she finds fortunate given the limitations in her left hand.    Review of Systems is noted in the HPI, as appropriate  Objective:   BP 110/80   Pulse 92   Temp 98.8 F (37.1 C) (Temporal)   Ht 5' (1.524 m)   Wt 224 lb (101.6 kg)   SpO2 97%   BMI 43.75 kg/m   GEN: No acute distress; alert,appropriate. PULM: Breathing comfortably in no respiratory distress PSYCH: Normally interactive.    EXTR: No clubbing/cyanosis/edema Normal gait  Hand: L Ecchymosis or edema: Swelling in the dorsum of the wrist ROM wrist/hand/digits/elbow: Mild restriction Carpals, MCP's, digits: Swelling and tenderness in the carpal bones and dorsum of the wrist Distal Ulna and Radius: NT Supination lift test: Positive Ecchymosis or edema: Swelling Cysts/nodules: neg Finkelstein's test: Positive Snuffbox tenderness: neg Scaphoid tubercle: NT Hook of Hamate: NT Resisted supination: Full Full composite fist Grip, all digits: 5/5 str No tenosynovitis Axial load test: Positive Phalen's: neg Tinel's: neg Atrophy: neg  Hand sensation: intact   Laboratory and Imaging Data:  Assessment and Plan:     ICD-10-CM   1. Arthritis of left wrist  M19.032     2. 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 De Quervain's tenosynovitis, left  M65.4     4. 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 No fracture, possible ligament disruption. Arthritis contributes to symptoms. -With severity of symptoms, certainly she could have had an intra-articular derangement including ligament disruption of the carpal bones. - Prescribed Celebrex  once daily for three weeks. - Administered wrist injection with Novocaine and Kenalog . -  Advised Voltaren  gel up to four times daily. - Recommended thumb spica splint for three weeks.  Wear this continuously for 3 weeks.  De Quervain's tenosynovitis, left wrist De Quervain's tenosynovitis with swelling and inflammation exacerbated by trauma. - Advised thumb spica splint for three weeks. - Recommended Voltaren  gel up to four times daily. - Prescribed Celebrex  once daily for three weeks.  Aspiration/Injection Procedure Note EPPIE BARHORST 1956/05/11 Date of procedure: 07/17/2024  Procedure: Medium Joint Aspiration / Injection of Wrist, L Indications: Pain  Procedure Details Patient verbally consented for procedure; risks, benefits, and alternatives explained. Patient prepped with Chloraprep. Ethyl chloride used for anesthesia. Using sterile technique, just distal to Lister's tubercle, using 3 cc syringe with 22 gauge needle, needle inserted and aspirated, no blood. Then 1/2 cc Lidocaine  1% and Kenalog  20 mg injected without difficulty into wrist.  Pressue applied, minimal blood. Tolerated well, decreased pain, no complications. Medication: 1/2 cc of Kenalog  40 mg (equaling Kenalog  20 mg)   Medication Management during today's office visit: Meds ordered this encounter  Medications   celecoxib  (CELEBREX ) 200 MG capsule    Sig: Take 1 capsule (200 mg total) by mouth daily.    Dispense:  30 capsule    Refill:  0   There are no discontinued medications.  Orders placed today for conditions managed today: No orders of the defined types were placed in this encounter.   Disposition: Return in about 5 weeks (around 08/21/2024) for Dr. Watt, wrist and thumb pain L.  [employer] One speech-to-text software was used for transcription in this dictation.  Possible transcriptional errors can occur using Animal nutritionist.   Signed,  Jacques DASEN. Cyana Shook, MD   Outpatient Encounter Medications as of 07/17/2024  Medication Sig   Ca Phosphate-Cholecalciferol (CALCIUM WITH D3 PO)  Take 1 tablet by mouth daily.   celecoxib  (CELEBREX ) 200 MG capsule Take 1 capsule (200 mg total) by mouth daily.   Cholecalciferol (VITAMIN D -3) 5000 units TABS Take 5,000 Units by mouth daily.   ELDERBERRY PO Take by mouth.   famotidine  (PEPCID ) 40 MG tablet Take 1 tablet daily for Heart burn & Acid Indigestion   fluticasone  (FLONASE ) 50 MCG/ACT nasal spray Place 1-2 sprays into both nostrils at bedtime.   hydrochlorothiazide  (HYDRODIURIL ) 12.5 MG tablet TAKE ONE TABLET BY MOUTH ONCE DAILY FOR BLOOD PRESSURE AND FLUID   latanoprost (XALATAN) 0.005 % ophthalmic solution Place 1 drop into both eyes at bedtime.   lisinopril  (ZESTRIL ) 10 MG tablet Take 1 tablet (10 mg total) by mouth daily.   loratadine  (CLARITIN ) 10 MG tablet Take 10 mg by mouth every morning.    MAGNESIUM  PO Take by mouth.   Multiple Vitamin (MULTIVITAMIN) capsule Take 1 capsule by mouth daily.    OVER THE COUNTER MEDICATION Balance of nature   OZEMPIC , 1 MG/DOSE, 4 MG/3ML SOPN INJECT 1 MG INTO THE SKIN ONCE A WEEK   pantoprazole  (PROTONIX ) 40 MG tablet TAKE 1 TABLET BY MOUTH DAILY   venlafaxine  XR (EFFEXOR -XR) 75 MG 24 hr capsule TAKE 1 CAPSULE BY MOUTH ONCE DAILY FOR MOOD   Zinc 15 MG CAPS Take by  mouth.   No facility-administered encounter medications on file as of 07/17/2024.   "

## 2024-07-17 ENCOUNTER — Ambulatory Visit: Admitting: Family Medicine

## 2024-07-17 ENCOUNTER — Encounter: Payer: Self-pay | Admitting: Family Medicine

## 2024-07-17 VITALS — BP 110/80 | HR 92 | Temp 98.8°F | Ht 60.0 in | Wt 224.0 lb

## 2024-07-17 DIAGNOSIS — M189 Osteoarthritis of first carpometacarpal joint, unspecified: Secondary | ICD-10-CM

## 2024-07-17 DIAGNOSIS — M25532 Pain in left wrist: Secondary | ICD-10-CM

## 2024-07-17 DIAGNOSIS — M654 Radial styloid tenosynovitis [de Quervain]: Secondary | ICD-10-CM

## 2024-07-17 DIAGNOSIS — M19032 Primary osteoarthritis, left wrist: Secondary | ICD-10-CM | POA: Diagnosis not present

## 2024-07-17 MED ORDER — CELECOXIB 200 MG PO CAPS: 200.0000 mg | ORAL_CAPSULE | Freq: Every day | ORAL | 0 refills | Status: DC

## 2024-07-17 MED ORDER — TRIAMCINOLONE ACETONIDE 40 MG/ML IJ SUSP
20.0000 mg | Freq: Once | INTRAMUSCULAR | Status: AC
  Administered 2024-07-17: 20 mg via INTRA_ARTICULAR

## 2024-07-17 NOTE — Patient Instructions (Signed)
 Voltaren 1% gel, over the counter ?You can apply up to 4 times a day ? ?This can be applied to any joint: knee, wrist, fingers, elbows, shoulders, feet and ankles. ?Can apply to any tendon: tennis elbow, achilles, tendon, rotator cuff or any other tendon. ? ?Minimal is absorbed in the bloodstream: ok with oral anti-inflammatory or a blood thinner. ? ?Cost is about 9 dollars  ?

## 2024-07-17 NOTE — Addendum Note (Signed)
 Addended by: WENDELL ARLAND RAMAN on: 07/17/2024 02:12 PM   Modules accepted: Orders

## 2024-07-20 ENCOUNTER — Encounter: Payer: Self-pay | Admitting: General Practice

## 2024-07-20 ENCOUNTER — Other Ambulatory Visit: Payer: Self-pay | Admitting: General Practice

## 2024-07-20 DIAGNOSIS — R7309 Other abnormal glucose: Secondary | ICD-10-CM

## 2024-07-21 MED ORDER — OZEMPIC (1 MG/DOSE) 4 MG/3ML ~~LOC~~ SOPN: 1.0000 mg | PEN_INJECTOR | SUBCUTANEOUS | 3 refills | Status: AC

## 2024-08-15 ENCOUNTER — Encounter: Payer: Self-pay | Admitting: Dermatology

## 2024-08-15 ENCOUNTER — Ambulatory Visit: Payer: BLUE CROSS/BLUE SHIELD | Admitting: Dermatology

## 2024-08-15 DIAGNOSIS — D229 Melanocytic nevi, unspecified: Secondary | ICD-10-CM

## 2024-08-15 DIAGNOSIS — L821 Other seborrheic keratosis: Secondary | ICD-10-CM

## 2024-08-15 DIAGNOSIS — Z1283 Encounter for screening for malignant neoplasm of skin: Secondary | ICD-10-CM

## 2024-08-15 DIAGNOSIS — L578 Other skin changes due to chronic exposure to nonionizing radiation: Secondary | ICD-10-CM

## 2024-08-15 DIAGNOSIS — L853 Xerosis cutis: Secondary | ICD-10-CM

## 2024-08-15 DIAGNOSIS — D1801 Hemangioma of skin and subcutaneous tissue: Secondary | ICD-10-CM

## 2024-08-15 DIAGNOSIS — L820 Inflamed seborrheic keratosis: Secondary | ICD-10-CM

## 2024-08-15 DIAGNOSIS — L814 Other melanin hyperpigmentation: Secondary | ICD-10-CM

## 2024-08-15 NOTE — Patient Instructions (Addendum)
 Cryotherapy Aftercare  Wash gently with soap and water everyday.   Apply Vaseline jelly daily until healed.     Recommend daily broad spectrum sunscreen SPF 30+ to sun-exposed areas, reapply every 2 hours as needed. Call for new or changing lesions.  Staying in the shade or wearing long sleeves, sun glasses (UVA+UVB protection) and wide brim hats (4-inch brim around the entire circumference of the hat) are also recommended for sun protection.      Melanoma ABCDEs  Melanoma is the most dangerous type of skin cancer, and is the leading cause of death from skin disease.  You are more likely to develop melanoma if you: Have light-colored skin, light-colored eyes, or red or blond hair Spend a lot of time in the sun Tan regularly, either outdoors or in a tanning bed Have had blistering sunburns, especially during childhood Have a close family member who has had a melanoma Have atypical moles or large birthmarks  Early detection of melanoma is key since treatment is typically straightforward and cure rates are extremely high if we catch it early.   The first sign of melanoma is often a change in a mole or a new dark spot.  The ABCDE system is a way of remembering the signs of melanoma.  A for asymmetry:  The two halves do not match. B for border:  The edges of the growth are irregular. C for color:  A mixture of colors are present instead of an even brown color. D for diameter:  Melanomas are usually (but not always) greater than 6mm - the size of a pencil eraser. E for evolution:  The spot keeps changing in size, shape, and color.  Please check your skin once per month between visits. You can use a small mirror in front and a large mirror behind you to keep an eye on the back side or your body.   If you see any new or changing lesions before your next follow-up, please call to schedule a visit.  Please continue daily skin protection including broad spectrum sunscreen SPF 30+ to  sun-exposed areas, reapplying every 2 hours as needed when you're outdoors.   Staying in the shade or wearing long sleeves, sun glasses (UVA+UVB protection) and wide brim hats (4-inch brim around the entire circumference of the hat) are also recommended for sun protection.      Due to recent changes in healthcare laws, you may see results of your pathology and/or laboratory studies on MyChart before the doctors have had a chance to review them. We understand that in some cases there may be results that are confusing or concerning to you. Please understand that not all results are received at the same time and often the doctors may need to interpret multiple results in order to provide you with the best plan of care or course of treatment. Therefore, we ask that you please give us  2 business days to thoroughly review all your results before contacting the office for clarification. Should we see a critical lab result, you will be contacted sooner.   If You Need Anything After Your Visit  If you have any questions or concerns for your doctor, please call our main line at [phone] and press option 4 to reach your doctor's medical assistant. If no one answers, please leave a voicemail as directed and we will return your call as soon as possible. Messages left after 4 pm will be answered the following business day.   You may also send  us  a message via MyChart. We typically respond to MyChart messages within 1-2 business days.  For prescription refills, please ask your pharmacy to contact our office. Our fax number is [phone].  If you have an urgent issue when the clinic is closed that cannot wait until the next business day, you can page your doctor at the number below.    Please note that while we do our best to be available for urgent issues outside of office hours, we are not available 24/7.   If you have an urgent issue and are unable to reach us , you may choose to seek medical care at  your doctor's office, retail clinic, urgent care center, or emergency room.  If you have a medical emergency, please immediately call 911 or go to the emergency department.  Pager Numbers  - Dr. Hester: [phone]  - Dr. Jackquline: [phone]  - Dr. Claudene: [phone]   - Dr. Raymund: [phone]  In the event of inclement weather, please call our main line at [phone] for an update on the status of any delays or closures.  Dermatology Medication Tips: Please keep the boxes that topical medications come in in order to help keep track of the instructions about where and how to use these. Pharmacies typically print the medication instructions only on the boxes and not directly on the medication tubes.   If your medication is too expensive, please contact our office at [phone] option 4 or send us  a message through MyChart.   We are unable to tell what your co-pay for medications will be in advance as this is different depending on your insurance coverage. However, we may be able to find a substitute medication at lower cost or fill out paperwork to get insurance to cover a needed medication.   If a prior authorization is required to get your medication covered by your insurance company, please allow us  1-2 business days to complete this process.  Drug prices often vary depending on where the prescription is filled and some pharmacies may offer cheaper prices.  The website www.goodrx.com contains coupons for medications through different pharmacies. The prices here do not account for what the cost may be with help from insurance (it may be cheaper with your insurance), but the website can give you the price if you did not use any insurance.  - You can print the associated coupon and take it with your prescription to the pharmacy.  - You may also stop by our office during regular business hours and pick up a GoodRx coupon card.  - If you need your prescription sent  electronically to a different pharmacy, notify our office through [facility] or by phone at [phone] option 4.     Si Usted Necesita Algo Despus de Su Visita  Tambin puede enviarnos un mensaje a travs de Clinical Cytogeneticist. Por lo general respondemos a los mensajes de MyChart en el transcurso de 1 a 2 das hbiles.  Para renovar recetas, por favor pida a su farmacia que se ponga en contacto con nuestra oficina. Randi lakes de fax es Francis [phone].  Si tiene un asunto urgente cuando la clnica est cerrada y que no puede esperar hasta el siguiente da hbil, puede llamar/localizar a su doctor(a) al nmero que aparece a continuacin.   Por favor, tenga en cuenta que aunque hacemos todo lo posible para estar disponibles para asuntos urgentes fuera del horario de [place], no estamos disponibles las 24 horas del da, los 7 [address]  [address] de la  semana.   Si tiene un problema urgente y no puede comunicarse con nosotros, puede optar por buscar atencin mdica  en el consultorio de su doctor(a), en una clnica privada, en un centro de atencin urgente o en una sala de emergencias.  Si tiene engineer, drilling, por favor llame inmediatamente al 911 o vaya a la sala de emergencias.  Nmeros de bper  - Dr. Hester: [phone]  - Dra. Jackquline: [phone]  - Dr. Claudene: [phone]  - Dra. Kitts: [phone]  En caso de inclemencias del [place], por favor llame a nuestra lnea principal al [phone] para una actualizacin sobre el estado de cualquier retraso o cierre.  Consejos para la medicacin en dermatologa: Por favor, guarde las cajas en las que vienen los medicamentos de uso tpico para ayudarle a seguir las instrucciones sobre dnde y cmo usarlos. Las farmacias generalmente imprimen las instrucciones del medicamento slo en las cajas y no directamente en los tubos del [place].   Si su medicamento es muy caro, por favor, pngase en contacto con landry rieger llamando al  [phone] y presione la opcin 4 o envenos un mensaje a travs de Clinical Cytogeneticist.   No podemos decirle cul ser su copago por los medicamentos por adelantado ya que esto es diferente dependiendo de la cobertura de su seguro. Sin embargo, es posible que podamos encontrar un medicamento sustituto a audiological scientist un formulario para que el seguro cubra el medicamento que se considera necesario.   Si se requiere una autorizacin previa para que su compaa de seguros cubra su medicamento, por favor permtanos de 1 a 2 das hbiles para completar este proceso.  Los precios de los medicamentos varan con frecuencia dependiendo del environmental consultant de dnde se surte la receta y alguna farmacias pueden ofrecer precios ms baratos.  El sitio web www.goodrx.com tiene cupones para medicamentos de health and safety inspector. Los precios aqu no tienen en cuenta lo que podra costar con la ayuda del seguro (puede ser ms barato con su seguro), pero el sitio web puede darle el precio si no utiliz tourist information centre manager.  - Puede imprimir el cupn correspondiente y llevarlo con su receta a la farmacia.  - Tambin puede pasar por nuestra oficina durante el horario de atencin regular y education officer, museum una tarjeta de cupones de GoodRx.  - Si necesita que su receta se enve electrnicamente a una farmacia diferente, informe a nuestra oficina a travs de MyChart de [place] o por telfono llamando al [phone] y presione la opcin 4.

## 2024-08-15 NOTE — Progress Notes (Signed)
° °  Follow-Up Visit   Subjective  Maureen [name] is a 68 y.o. female who presents for the following: Skin Cancer Screening and Full Body Skin Exam. No personal hx of skin cancer or dysplastic nevi.   The patient presents for Total-Body Skin Exam (TBSE) for skin cancer screening and mole check. The patient has spots, moles and lesions to be evaluated, some may be new or changing and the patient may have concern these could be cancer. Spot on abdomen gets irritated.    The following portions of the chart were reviewed this encounter and updated as appropriate: medications, allergies, medical history  Review of Systems:  No other skin or systemic complaints except as noted in HPI or Assessment and Plan.  Objective  Well appearing patient in no apparent distress; mood and affect are within normal limits.  A full examination was performed including scalp, head, eyes, ears, nose, lips, neck, chest, axillae, abdomen, back, buttocks, bilateral upper extremities, bilateral lower extremities, hands, feet, fingers, toes, fingernails, and toenails. All findings within normal limits unless otherwise noted below.   Relevant physical exam findings are noted in the Assessment and Plan.  central upper abdomen x1 Erythematous keratotic or waxy stuck-on papule   Assessment & Plan   SKIN CANCER SCREENING PERFORMED TODAY.  ACTINIC DAMAGE - Chronic condition, secondary to cumulative UV/sun exposure - diffuse scaly erythematous macules with underlying dyspigmentation - Recommend daily broad spectrum sunscreen SPF 30+ to sun-exposed areas, reapply every 2 hours as needed.  - Staying in the shade or wearing long sleeves, sun glasses (UVA+UVB protection) and wide brim hats (4-inch brim around the entire circumference of the hat) are also recommended for sun protection.  - Call for new or changing lesions.  LENTIGINES, SEBORRHEIC KERATOSES, HEMANGIOMAS - Benign normal skin lesions. Face, torso, arms.  -  Benign-appearing - Call for any changes  MELANOCYTIC NEVI - Tan-brown and/or pink-flesh-colored symmetric macules and papules - Benign appearing on exam today - Observation - Call clinic for new or changing moles - Recommend daily use of broad spectrum spf 30+ sunscreen to sun-exposed areas.   SEBORRHEIC KERATOSIS - 1.0 cm waxy tan macule, darker inferior at left upper antecubitum.  - Benign-appearing - Discussed benign etiology and prognosis. Stable compared to previous visit. - Observe - Call for any changes  Xerosis with pruritus  - diffuse xerotic patches at legs, back - recommend gentle, hydrating skin care - gentle skin care handout given   INFLAMED SEBORRHEIC KERATOSIS central upper abdomen x1 Symptomatic, irritating, patient would like treated. - Destruction of lesion - central upper abdomen x1  Destruction method: cryotherapy   Informed consent: discussed and consent obtained   Lesion destroyed using liquid nitrogen: Yes   Region frozen until ice ball extended beyond lesion: Yes   Outcome: patient tolerated procedure well with no complications   Post-procedure details: wound care instructions given   Additional details:  Prior to procedure, discussed risks of blister formation, small wound, skin dyspigmentation, or rare scar following cryotherapy. Recommend Vaseline ointment to treated areas while healing.    Return for TBSE in 1-2 years.  I, Jill Parcell, CMA, am acting as scribe for Rexene Rattler, MD.   Documentation: I have reviewed the above documentation for accuracy and completeness, and I agree with the above.  Rexene Rattler, MD

## 2024-08-20 NOTE — Progress Notes (Unsigned)
 "    Maureen [name] T. Maureen Laventure, MD, CAQ Sports Medicine [facility] at [facility] [address] [place], [address]  Phone: [phone]  FAX: [phone]  Maureen [name] - 68 y.o. female  MRN [id number]  Date of Birth: 10-03-1955  Date: 08/21/2024  PCP: Maureen Shivers, NP  Referral: Maureen Shivers, NP  No chief complaint on file.  Subjective:   Maureen [name] is a 68 y.o. very pleasant female patient with There is no height or weight on file to calculate BMI. who presents with the following:  Discussed the use of AI scribe software for clinical note transcription with the patient, who gave verbal consent to proceed.  Patient is here for 5-week follow-up.  I last saw her in July 17, 2024, and that was for left-sided wrist osteoarthritis.  At that point, felt like she was having significant flare of her basal joint osteoarthritis as well as wrist arthritis.  I did do an intra-articular wrist injection at that time.  She also was having some significant de Quervain's tenosynovitis.  I placed her in a thumb spica splint and also had her start some Celebrex . History of Present Illness     Review of Systems is noted in the HPI, as appropriate  Objective:   There were no vitals taken for this visit.  GEN: No acute distress; alert,appropriate. PULM: Breathing comfortably in no respiratory distress PSYCH: Normally interactive.   Laboratory and Imaging Data:  Assessment and Plan:   No diagnosis found. Assessment & Plan   Medication Management during today's office visit: No orders of the defined types were placed in this encounter.  There are no discontinued medications.  Orders placed today for conditions managed today: No orders of the defined types were placed in this encounter.   Disposition: No follow-ups on file.  [employer] One speech-to-text software was used for transcription in this dictation.  Possible transcriptional errors can occur  using Animal nutritionist.   Signed,  Maureen [name]. Maureen Waltz, MD   Outpatient Encounter Medications as of 08/21/2024  Medication Sig   Ca Phosphate-Cholecalciferol (CALCIUM WITH D3 PO) Take 1 tablet by mouth daily.   celecoxib  (CELEBREX ) 200 MG capsule Take 1 capsule (200 mg total) by mouth daily.   Cholecalciferol (VITAMIN D -3) 5000 units TABS Take 5,000 Units by mouth daily.   ELDERBERRY PO Take by mouth.   famotidine  (PEPCID ) 40 MG tablet Take 1 tablet daily for Heart burn & Acid Indigestion   fluticasone  (FLONASE ) 50 MCG/ACT nasal spray Place 1-2 sprays into both nostrils at bedtime.   hydrochlorothiazide  (HYDRODIURIL ) 12.5 MG tablet TAKE ONE TABLET BY MOUTH ONCE DAILY FOR BLOOD PRESSURE AND FLUID   latanoprost (XALATAN) 0.005 % ophthalmic solution Place 1 drop into both eyes at bedtime.   lisinopril  (ZESTRIL ) 10 MG tablet Take 1 tablet (10 mg total) by mouth daily.   loratadine  (CLARITIN ) 10 MG tablet Take 10 mg by mouth every morning.    MAGNESIUM  PO Take by mouth.   Multiple Vitamin (MULTIVITAMIN) capsule Take 1 capsule by mouth daily.    OVER THE COUNTER MEDICATION Balance of nature   pantoprazole  (PROTONIX ) 40 MG tablet TAKE 1 TABLET BY MOUTH DAILY   Semaglutide , 1 MG/DOSE, (OZEMPIC , 1 MG/DOSE,) 4 MG/3ML SOPN Inject 1 mg into the skin once a week.   venlafaxine  XR (EFFEXOR -XR) 75 MG 24 hr capsule TAKE 1 CAPSULE BY MOUTH ONCE DAILY FOR MOOD   Zinc 15 MG CAPS Take by mouth.   No facility-administered  encounter medications on file as of 08/21/2024.   "

## 2024-08-21 ENCOUNTER — Ambulatory Visit: Admitting: Family Medicine

## 2024-08-21 ENCOUNTER — Encounter: Payer: Self-pay | Admitting: Family Medicine

## 2024-08-21 VITALS — BP 104/80 | HR 87 | Temp 97.3°F | Ht 60.0 in | Wt 222.5 lb

## 2024-08-21 DIAGNOSIS — M189 Osteoarthritis of first carpometacarpal joint, unspecified: Secondary | ICD-10-CM

## 2024-08-21 DIAGNOSIS — M654 Radial styloid tenosynovitis [de Quervain]: Secondary | ICD-10-CM

## 2024-08-21 DIAGNOSIS — M19032 Primary osteoarthritis, left wrist: Secondary | ICD-10-CM

## 2024-08-21 MED ORDER — PREDNISONE 20 MG PO TABS: ORAL_TABLET | ORAL | 0 refills | Status: DC

## 2024-09-01 ENCOUNTER — Other Ambulatory Visit: Payer: Self-pay | Admitting: Nurse Practitioner

## 2024-09-01 DIAGNOSIS — I10 Essential (primary) hypertension: Secondary | ICD-10-CM

## 2024-09-05 ENCOUNTER — Other Ambulatory Visit: Payer: Self-pay | Admitting: General Practice

## 2024-09-05 DIAGNOSIS — I10 Essential (primary) hypertension: Secondary | ICD-10-CM

## 2024-09-05 NOTE — Telephone Encounter (Unsigned)
 Copied from CRM [id number]. Topic: Clinical - Medication Refill >> Sep 05, 2024  8:56 AM Robinson H wrote: Medication: lisinopril  (ZESTRIL ) 10 MG tablet, pantoprazole  (PROTONIX ) 40 MG tablet  Has the patient contacted their pharmacy? Yes, refill sent to previous provider (Agent: If no, request that the patient contact the pharmacy for the refill. If patient does not wish to contact the pharmacy document the reason why and proceed with request.) (Agent: If yes, when and what did the pharmacy advise?)  This is the patient's preferred pharmacy:  [facility] - [place], [place] - [address] [place] [address] Phone: [phone] Fax: [phone]  Is this the correct pharmacy for this prescription? Yes If no, delete pharmacy and type the correct one.   Has the prescription been filled recently? No  Is the patient out of the medication? Yes  Has the patient been seen for an appointment in the last year OR does the patient have an upcoming appointment? Yes  Can we respond through MyChart? Yes  Agent: Please be advised that Rx refills may take up to 3 business days. We ask that you follow-up with your pharmacy.

## 2024-09-06 MED ORDER — LISINOPRIL 10 MG PO TABS: 10.0000 mg | ORAL_TABLET | Freq: Every day | ORAL | 1 refills | Status: AC

## 2024-09-06 MED ORDER — PANTOPRAZOLE SODIUM 40 MG PO TBEC: 40.0000 mg | DELAYED_RELEASE_TABLET | Freq: Every day | ORAL | 3 refills | Status: AC

## 2024-09-06 NOTE — Telephone Encounter (Signed)
 Medication refused. This was sent earlier today.

## 2024-09-06 NOTE — Telephone Encounter (Signed)
 Copied from CRM #[id number]. Topic: Clinical - Prescription Issue >> Sep 06, 2024 11:15 AM Jasmin G wrote: Reason for CRM: Pt requested for her prescription refill for lisinopril  (ZESTRIL ) 10 MG tablet to be sent to pharmacy today, if possible due to being out and going out of town soon.

## 2024-09-20 ENCOUNTER — Ambulatory Visit: Payer: BLUE CROSS/BLUE SHIELD | Admitting: Nurse Practitioner

## 2024-10-04 ENCOUNTER — Ambulatory Visit: Admitting: General Practice

## 2024-10-04 ENCOUNTER — Encounter: Payer: Self-pay | Admitting: General Practice

## 2024-10-04 VITALS — BP 130/80 | HR 85 | Temp 97.4°F | Ht 60.0 in | Wt 225.0 lb

## 2024-10-04 DIAGNOSIS — I10 Essential (primary) hypertension: Secondary | ICD-10-CM

## 2024-10-04 DIAGNOSIS — R7303 Prediabetes: Secondary | ICD-10-CM

## 2024-10-04 DIAGNOSIS — R911 Solitary pulmonary nodule: Secondary | ICD-10-CM

## 2024-10-04 DIAGNOSIS — J3089 Other allergic rhinitis: Secondary | ICD-10-CM

## 2024-10-04 DIAGNOSIS — M1990 Unspecified osteoarthritis, unspecified site: Secondary | ICD-10-CM

## 2024-10-04 DIAGNOSIS — H409 Unspecified glaucoma: Secondary | ICD-10-CM

## 2024-10-04 DIAGNOSIS — K589 Irritable bowel syndrome without diarrhea: Secondary | ICD-10-CM

## 2024-10-04 DIAGNOSIS — E785 Hyperlipidemia, unspecified: Secondary | ICD-10-CM

## 2024-10-04 DIAGNOSIS — K2100 Gastro-esophageal reflux disease with esophagitis, without bleeding: Secondary | ICD-10-CM

## 2024-10-04 DIAGNOSIS — I700 Atherosclerosis of aorta: Secondary | ICD-10-CM

## 2024-10-04 LAB — COMPREHENSIVE METABOLIC PANEL WITH GFR
ALT: 15 U/L (ref 3–35)
AST: 15 U/L (ref 5–37)
Albumin: 4 g/dL (ref 3.5–5.2)
Alkaline Phosphatase: 79 U/L (ref 39–117)
BUN: 17 mg/dL (ref 6–23)
CO2: 30 meq/L (ref 19–32)
Calcium: 10.1 mg/dL (ref 8.4–10.5)
Chloride: 101 meq/L (ref 96–112)
Creatinine, Ser: 0.66 mg/dL (ref 0.40–1.20)
GFR: 89.2 mL/min
Glucose, Bld: 94 mg/dL (ref 70–99)
Potassium: 4 meq/L (ref 3.5–5.1)
Sodium: 139 meq/L (ref 135–145)
Total Bilirubin: 0.4 mg/dL (ref 0.2–1.2)
Total Protein: 6.7 g/dL (ref 6.0–8.3)

## 2024-10-04 LAB — CBC
HCT: 35.8 % — ABNORMAL LOW (ref 36.0–46.0)
Hemoglobin: 11.8 g/dL — ABNORMAL LOW (ref 12.0–15.0)
MCHC: 33.1 g/dL (ref 30.0–36.0)
MCV: 85.3 fl (ref 78.0–100.0)
Platelets: 331 10*3/uL (ref 150.0–400.0)
RBC: 4.2 Mil/uL (ref 3.87–5.11)
RDW: 15.7 % — ABNORMAL HIGH (ref 11.5–15.5)
WBC: 5.7 10*3/uL (ref 4.0–10.5)

## 2024-10-04 LAB — HEMOGLOBIN A1C: Hgb A1c MFr Bld: 6 % (ref 4.6–6.5)

## 2024-10-04 NOTE — Progress Notes (Signed)
 "  Established Patient Office Visit  Subjective   Patient ID: Maureen [name], female    DOB: Sep 11, 1955  Age: 69 y.o. MRN: [id number]  Chief Complaint  Patient presents with   chronic care management    Patient here today to follow up on chronic conditions    HPI  Maureen [name] is a 69 year old female with past medical history of HTN, aortic atheroscleoris, pulmonary nodule, IBS, GERD, OA, Lap-band surgery status, prediabetes, HLD, morbid obesity,  presents today for chronic management.   Discussed the use of AI scribe software for clinical note transcription with the patient, who gave verbal consent to proceed.  History of Present Illness Maureen [name] is a 69 year old female who presents for a routine follow-up visit.  She experiences persistent hand pain due to arthritis, which worsens with movements like bending her thumb back or applying pressure. Previous treatments included prednisone , a steroid injection, bracing, and Celebrex , which were effective but discontinued due to her lap band. She uses Voltaren  for flare-ups and notes that ibuprofen  causes indigestion.  Her hypertension is managed with lisinopril  10 mg and hydrochlorothiazide  12.5 mg daily, which she tolerates well. She occasionally checks her blood pressure at home and can tell when it is elevated by symptoms such as headaches.  She has a history of a lung nodule, which was followed up with a CT scan showing no nodules.  Her IBS is well-controlled, and she takes Protonix  and famotidine  for reflux. She has not experienced recent reflux symptoms. She is interested in the weaning off of protonix .  She has prediabetes with an A1c of 5.9, stable over the past year. Regular monitoring is necessary due to her prediabetes, hypertension, and high cholesterol.  Her cholesterol levels have improved, with only triglycerides remaining slightly elevated. She was not fasting today.   She has a history of glaucoma and is  transitioning to timolol eye drops after a recent medication change due to side effects.  She uses Ozempic  for weight management and IBS control, which has been effective. She is concerned about future insurance coverage changes due to her husband's retirement and upcoming transition to Medicare.  She takes Effexor  for mood, which she tolerates well. Denies SI/HI.    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   History of total knee replacement, left 02/10/2018   Primary localized osteoarthritis of left knee 07/13/2017   Primary osteoarthritis of right knee 02/16/2016   Female genuine stress incontinence 10/29/2015   Arthritis, degenerative 10/29/2015   Allergic rhinitis 10/29/2015   Prediabetes 12/03/2014   Hyperlipidemia 12/03/2014   Morbid obesity (HCC) 12/03/2014   IBS (irritable bowel syndrome)    GERD (gastroesophageal reflux disease)    Hypertension    LAP-BAND surgery status 01/17/2013   Past Medical History:  Diagnosis Date   Arthritis    LEFT KNEE, LOWER BACK   GERD (gastroesophageal reflux disease)    History of hiatal hernia    History of obstructive sleep apnea    per pt history osa w/ cpap until wt loss after gastric band, retested negative for sleep apnea   Hypertension    IBS (irritable bowel syndrome)    Numbness    BOTTOM LEFT FOOT   PONV (postoperative nausea and vomiting)    PONV with  gallbladder    Seasonal allergies    Sinus drainage    Snores    WEARS NIGHT GUARD   SUI (stress urinary incontinence, female)    Past Surgical History:  Procedure Laterality Date   BREAST LUMPECTOMY Left 1972   benign tumor   CATARACT EXTRACTION W/ INTRAOCULAR LENS  IMPLANT, BILATERAL Bilateral 05/2017   COLONOSCOPY     DILATION AND CURETTAGE OF UTERUS   multiple   ESOPHAGOGASTRODUODENOSCOPY     LAPAROSCOPIC ASSISTED VAGINAL HYSTERECTOMY  02-06-2004   dr georgia   LAPAROSCOPIC CHOLECYSTECTOMY  01-03-2002   dr adel   LAPAROSCOPIC GASTRIC BANDING  11/05/2009   dr gladis   AND REPAIR HIATAL HERNIA   LAPAROSCOPIC GASTRIC BANDING WITH HIATAL HERNIA REPAIR N/A 01/22/2015   Procedure: LAPAROSCOPIC TAKEDOWN AND REVISION OF GASTRIC BAND REVISION WITH UPPER ENDOSCOPY;  Surgeon: Donnice Gladis, MD;  Location: [facility];  Service: General;  Laterality: N/A;   ORIF TOE FRACTURE Left    little toe   TOTAL KNEE ARTHROPLASTY Right 02/17/2016   Procedure: TOTAL KNEE ARTHROPLASTY;  Surgeon: Dempsey Sensor, MD;  Location: [facility];  Service: Orthopedics;  Laterality: Right;   TOTAL KNEE ARTHROPLASTY Left 07/13/2017   Procedure: TOTAL KNEE ARTHROPLASTY;  Surgeon: Kathlynn Sharper, MD;  Location: [facility];  Service: Orthopedics;  Laterality: Left;   TOTAL KNEE REVISION Left 05/18/2018   Procedure: Left total knee arthroplasty revision;  Surgeon: Melodi Dempsey, MD;  Location: [facility];  Service: Orthopedics;  Laterality: Left;  Adductor Block   TUBAL LIGATION Bilateral yrs ago   Allergies[1]       10/04/2024   10:01 AM 07/04/2024    9:03 AM 04/03/2024   10:39 AM  Depression screen PHQ 2/9  Decreased Interest 0 0 0  Down, Depressed, Hopeless 0 0 0  PHQ - 2 Score 0 0 0  Altered sleeping 0 0 0  Tired, decreased energy 0 0 0  Change in appetite 0 0 0  Feeling bad or failure about yourself  0 0 0  Trouble concentrating 0 0 0  Moving slowly or fidgety/restless 0 0 0  Suicidal thoughts 0 0 0  PHQ-9 Score 0 0  0   Difficult doing work/chores Not difficult at all Not difficult at all Not difficult at all     Data saved with a previous flowsheet row definition       10/04/2024   10:01 AM 07/04/2024    9:03 AM 04/03/2024   10:40 AM 11/23/2023   10:34 AM  GAD 7 : Generalized Anxiety Score  Nervous, Anxious, on Edge 0 0  0  0   Control/stop worrying 0 0  0  0   Worry too much  - different things 0 0  0  0   Trouble relaxing 0 0  0  0   Restless 0 0  0  0   Easily annoyed or irritable 0 0  0  0   Afraid - awful might happen 0 0  0  0   Total GAD 7 Score 0 0 0 0  Anxiety Difficulty Not difficult at all Not difficult at all Not difficult at all Not difficult at all     Data saved with a previous flowsheet row definition      Review of Systems  Constitutional:  Negative for chills and fever.  Respiratory:  Negative for shortness of breath.   Cardiovascular:  Negative for chest pain.  Gastrointestinal:  Negative for abdominal pain, constipation, diarrhea,  heartburn, nausea and vomiting.  Genitourinary:  Negative for dysuria, frequency and urgency.  Neurological:  Negative for dizziness and headaches.  Endo/Heme/Allergies:  Negative for polydipsia.  Psychiatric/Behavioral:  Negative for depression and suicidal ideas. The patient is not nervous/anxious.       Objective:     BP 130/80   Pulse 85   Temp (!) 97.4 F (36.3 C) (Temporal)   Ht 5' (1.524 m)   Wt 225 lb (102.1 kg)   SpO2 98%   BMI 43.94 kg/m  BP Readings from Last 3 Encounters:  10/04/24 130/80  08/21/24 104/80  07/17/24 110/80   Wt Readings from Last 3 Encounters:  10/04/24 225 lb (102.1 kg)  08/21/24 222 lb 8 oz (100.9 kg)  07/17/24 224 lb (101.6 kg)      Physical Exam Vitals and nursing note reviewed.  Constitutional:      Appearance: Normal appearance.  Cardiovascular:     Rate and Rhythm: Normal rate and regular rhythm.     Pulses: Normal pulses.     Heart sounds: Normal heart sounds.  Pulmonary:     Effort: Pulmonary effort is normal.     Breath sounds: Normal breath sounds.  Neurological:     Mental Status: She is alert and oriented to person, place, and time.  Psychiatric:        Mood and Affect: Mood normal.        Behavior: Behavior normal.        Thought Content: Thought content normal.        Judgment: Judgment normal.      No results found for any visits  on 10/04/24.     The 10-year ASCVD risk score (Arnett DK, et al., 2019) is: 10.6%    Assessment & Plan:  Primary hypertension -     CBC -     Comprehensive metabolic panel with GFR  Aortic atherosclerosis  Pulmonary nodule, left  Non-seasonal allergic rhinitis, unspecified trigger  Irritable bowel syndrome, unspecified type  Gastroesophageal reflux disease with esophagitis without hemorrhage  Osteoarthritis, unspecified osteoarthritis type, unspecified site  Prediabetes -     Hemoglobin A1c  Hyperlipidemia, unspecified hyperlipidemia type  Morbid obesity (HCC)  Glaucoma, unspecified glaucoma type, unspecified laterality    Assessment and Plan Assessment & Plan Osteoarthritis of the hand/wrist Chronic osteoarthritis with intermittent pain. Celebrex  effective but discontinued. Voltaren  gel provides temporary relief. - Continue Voltaren  gel for flare-ups. - Consider referral to orthopedic specialist if symptoms worsen.  Primary hypertension Blood pressure generally well-controlled with current medication. Slightly elevated reading likely due to recent activity. - Continue lisinopril  10 mg daily and hydrochlorothiazide  12.5 mg daily.  Prediabetes A1c stable at 5.9% since 2024. Regular monitoring necessary due to risk factors. - Ordered A1c test to monitor prediabetes status.  Hyperlipidemia Cholesterol levels improved. Total cholesterol and LDL within acceptable ranges. Triglycerides improved. - Recheck cholesterol levels at next visit with fasting.  Morbid obesity Ozempic  effective for weight management and IBS symptoms. Insurance coverage through [employer] , future Medicare coverage uncertain. - Continue Ozempic  for weight management and IBS control. - Discuss insurance coverage for Ozempic  with Medicare representative.  Gastroesophageal reflux disease with esophagitis Managed with Protonix  and famotidine . Plan to reduce Protonix  due to long-term risks. -  Reduce Protonix  to 20 mg and monitor symptoms. - If symptoms remain controlled, discontinue Protonix  and continue famotidine .  Irritable bowel syndrome IBS symptoms well-controlled with Ozempic . - Continue Ozempic  for IBS management.  Glaucoma Managed with timolol eye drops. -  Continue timolol eye drops as prescribed.  Osteoporosis Bone density normal in September. Vitamin D  and calcium supplementation ongoing. - Continue vitamin D  and calcium supplementation. - Repeat bone density scan in three years.  Pulmonary nodule, resolved Previous lung nodule resolved. No further imaging required.  General health maintenance Routine health maintenance discussed. - Scheduled physical exam and fasting labs for August 5th. - Ordered labs including kidney function, liver function, blood counts, and A1c.   Return in about 26 weeks (around 04/04/2025) for PHYSICAL AND FASTING LABS.SABRA Carrol Aurora, NP    [1]  Allergies Allergen Reactions   Mucinex [Guaifenesin Er] Other (See Comments)    feels weird and dizzy   Tramadol Itching   "

## 2024-10-04 NOTE — Patient Instructions (Signed)
 Stop by the lab prior to leaving today. I will notify you of your results once received.   Follow up in August for physical and fasting labs.  It was a pleasure to see you today!

## 2024-10-05 ENCOUNTER — Ambulatory Visit: Payer: Self-pay | Admitting: General Practice

## 2024-10-05 DIAGNOSIS — D649 Anemia, unspecified: Secondary | ICD-10-CM

## 2024-10-06 ENCOUNTER — Ambulatory Visit

## 2024-10-06 ENCOUNTER — Ambulatory Visit: Payer: Self-pay | Admitting: General Practice

## 2024-10-06 DIAGNOSIS — D649 Anemia, unspecified: Secondary | ICD-10-CM

## 2024-10-06 LAB — IBC + FERRITIN
Ferritin: 5.7 ng/mL — ABNORMAL LOW (ref 10.0–291.0)
Iron: 57 ug/dL (ref 42–145)
Saturation Ratios: 12.4 % — ABNORMAL LOW (ref 20.0–50.0)
TIBC: 459.2 ug/dL — ABNORMAL HIGH (ref 250.0–450.0)
Transferrin: 328 mg/dL (ref 212.0–360.0)

## 2025-04-04 ENCOUNTER — Encounter: Admitting: General Practice

## 2025-09-11 ENCOUNTER — Encounter: Admitting: Dermatology
